# Patient Record
Sex: Female | Born: 1945 | Race: White | Hispanic: No | Marital: Married | Smoking: Former smoker
Health system: Southern US, Community
[De-identification: ages and names within clinical notes are randomized; demographics above are authoritative.]

## PROBLEM LIST (undated history)

## (undated) DIAGNOSIS — L409 Psoriasis, unspecified: Secondary | ICD-10-CM

## (undated) DIAGNOSIS — M549 Dorsalgia, unspecified: Secondary | ICD-10-CM

## (undated) DIAGNOSIS — F32A Depression, unspecified: Secondary | ICD-10-CM

## (undated) DIAGNOSIS — F419 Anxiety disorder, unspecified: Secondary | ICD-10-CM

## (undated) DIAGNOSIS — B9562 Methicillin resistant Staphylococcus aureus infection as the cause of diseases classified elsewhere: Secondary | ICD-10-CM

## (undated) DIAGNOSIS — K439 Ventral hernia without obstruction or gangrene: Secondary | ICD-10-CM

## (undated) DIAGNOSIS — G8929 Other chronic pain: Secondary | ICD-10-CM

## (undated) DIAGNOSIS — N390 Urinary tract infection, site not specified: Secondary | ICD-10-CM

## (undated) DIAGNOSIS — T8859XA Other complications of anesthesia, initial encounter: Secondary | ICD-10-CM

## (undated) DIAGNOSIS — M3500 Sicca syndrome, unspecified: Secondary | ICD-10-CM

## (undated) DIAGNOSIS — R569 Unspecified convulsions: Secondary | ICD-10-CM

## (undated) DIAGNOSIS — F319 Bipolar disorder, unspecified: Secondary | ICD-10-CM

## (undated) DIAGNOSIS — T4145XA Adverse effect of unspecified anesthetic, initial encounter: Secondary | ICD-10-CM

## (undated) DIAGNOSIS — K5790 Diverticulosis of intestine, part unspecified, without perforation or abscess without bleeding: Secondary | ICD-10-CM

## (undated) DIAGNOSIS — F329 Major depressive disorder, single episode, unspecified: Secondary | ICD-10-CM

## (undated) DIAGNOSIS — D6861 Antiphospholipid syndrome: Secondary | ICD-10-CM

## (undated) DIAGNOSIS — L0390 Cellulitis, unspecified: Secondary | ICD-10-CM

## (undated) HISTORY — PX: APPENDECTOMY: SHX54

## (undated) HISTORY — PX: ABDOMINAL HYSTERECTOMY: SHX81

## (undated) HISTORY — PX: CHOLECYSTECTOMY: SHX55

## (undated) HISTORY — PX: PILONIDAL CYST EXCISION: SHX744

---

## 1998-07-23 ENCOUNTER — Encounter: Payer: Self-pay | Admitting: Emergency Medicine

## 1998-07-23 ENCOUNTER — Emergency Department (HOSPITAL_COMMUNITY): Admission: EM | Admit: 1998-07-23 | Discharge: 1998-07-23 | Payer: Self-pay | Admitting: Emergency Medicine

## 1999-06-29 ENCOUNTER — Ambulatory Visit: Admission: RE | Admit: 1999-06-29 | Discharge: 1999-06-29 | Payer: Self-pay | Admitting: Family Medicine

## 1999-07-17 ENCOUNTER — Other Ambulatory Visit: Admission: RE | Admit: 1999-07-17 | Discharge: 1999-07-17 | Payer: Self-pay | Admitting: Obstetrics and Gynecology

## 2000-03-02 ENCOUNTER — Emergency Department (HOSPITAL_COMMUNITY): Admission: EM | Admit: 2000-03-02 | Discharge: 2000-03-02 | Payer: Self-pay | Admitting: *Deleted

## 2000-03-02 ENCOUNTER — Encounter: Payer: Self-pay | Admitting: Emergency Medicine

## 2000-11-06 ENCOUNTER — Emergency Department (HOSPITAL_COMMUNITY): Admission: EM | Admit: 2000-11-06 | Discharge: 2000-11-06 | Payer: Self-pay | Admitting: Emergency Medicine

## 2000-11-26 ENCOUNTER — Encounter: Payer: Self-pay | Admitting: Oral Surgery

## 2000-12-02 ENCOUNTER — Ambulatory Visit (HOSPITAL_COMMUNITY): Admission: RE | Admit: 2000-12-02 | Discharge: 2000-12-03 | Payer: Self-pay | Admitting: Oral Surgery

## 2001-05-22 ENCOUNTER — Emergency Department (HOSPITAL_COMMUNITY): Admission: EM | Admit: 2001-05-22 | Discharge: 2001-05-22 | Payer: Self-pay | Admitting: Emergency Medicine

## 2001-05-22 ENCOUNTER — Encounter: Payer: Self-pay | Admitting: Emergency Medicine

## 2001-05-22 ENCOUNTER — Inpatient Hospital Stay (HOSPITAL_COMMUNITY): Admission: EM | Admit: 2001-05-22 | Discharge: 2001-06-01 | Payer: Self-pay

## 2002-01-24 ENCOUNTER — Emergency Department (HOSPITAL_COMMUNITY): Admission: EM | Admit: 2002-01-24 | Discharge: 2002-01-24 | Payer: Self-pay | Admitting: Emergency Medicine

## 2002-08-29 ENCOUNTER — Emergency Department (HOSPITAL_COMMUNITY): Admission: EM | Admit: 2002-08-29 | Discharge: 2002-08-29 | Payer: Self-pay | Admitting: Emergency Medicine

## 2002-08-29 ENCOUNTER — Encounter: Payer: Self-pay | Admitting: Emergency Medicine

## 2003-10-31 ENCOUNTER — Inpatient Hospital Stay (HOSPITAL_COMMUNITY): Admission: EM | Admit: 2003-10-31 | Discharge: 2003-11-04 | Payer: Self-pay | Admitting: Emergency Medicine

## 2003-11-26 ENCOUNTER — Emergency Department (HOSPITAL_COMMUNITY): Admission: EM | Admit: 2003-11-26 | Discharge: 2003-11-27 | Payer: Self-pay

## 2004-05-10 ENCOUNTER — Encounter
Admission: RE | Admit: 2004-05-10 | Discharge: 2004-08-08 | Payer: Self-pay | Admitting: Physical Medicine & Rehabilitation

## 2004-05-14 ENCOUNTER — Ambulatory Visit: Payer: Self-pay | Admitting: Physical Medicine & Rehabilitation

## 2004-05-15 ENCOUNTER — Inpatient Hospital Stay (HOSPITAL_COMMUNITY): Admission: EM | Admit: 2004-05-15 | Discharge: 2004-05-23 | Payer: Self-pay | Admitting: Emergency Medicine

## 2004-07-24 ENCOUNTER — Inpatient Hospital Stay (HOSPITAL_COMMUNITY): Admission: EM | Admit: 2004-07-24 | Discharge: 2004-07-26 | Payer: Self-pay | Admitting: Emergency Medicine

## 2004-08-23 ENCOUNTER — Ambulatory Visit (HOSPITAL_COMMUNITY): Admission: RE | Admit: 2004-08-23 | Discharge: 2004-08-23 | Payer: Self-pay

## 2004-08-29 ENCOUNTER — Inpatient Hospital Stay (HOSPITAL_COMMUNITY): Admission: EM | Admit: 2004-08-29 | Discharge: 2004-09-08 | Payer: Self-pay | Admitting: Emergency Medicine

## 2004-10-13 ENCOUNTER — Inpatient Hospital Stay (HOSPITAL_COMMUNITY): Admission: EM | Admit: 2004-10-13 | Discharge: 2004-10-17 | Payer: Self-pay | Admitting: Emergency Medicine

## 2005-01-23 ENCOUNTER — Inpatient Hospital Stay (HOSPITAL_COMMUNITY): Admission: EM | Admit: 2005-01-23 | Discharge: 2005-01-28 | Payer: Self-pay | Admitting: *Deleted

## 2005-02-24 ENCOUNTER — Inpatient Hospital Stay (HOSPITAL_COMMUNITY): Admission: EM | Admit: 2005-02-24 | Discharge: 2005-02-28 | Payer: Self-pay | Admitting: Emergency Medicine

## 2005-02-26 ENCOUNTER — Ambulatory Visit: Payer: Self-pay | Admitting: Internal Medicine

## 2005-02-27 ENCOUNTER — Encounter
Admission: RE | Admit: 2005-02-27 | Discharge: 2005-05-28 | Payer: Self-pay | Admitting: Physical Medicine & Rehabilitation

## 2005-02-27 ENCOUNTER — Ambulatory Visit: Payer: Self-pay | Admitting: Physical Medicine & Rehabilitation

## 2005-05-07 ENCOUNTER — Inpatient Hospital Stay (HOSPITAL_COMMUNITY): Admission: EM | Admit: 2005-05-07 | Discharge: 2005-05-11 | Payer: Self-pay | Admitting: Emergency Medicine

## 2005-06-22 ENCOUNTER — Inpatient Hospital Stay (HOSPITAL_COMMUNITY): Admission: EM | Admit: 2005-06-22 | Discharge: 2005-06-27 | Payer: Self-pay | Admitting: Internal Medicine

## 2005-07-06 ENCOUNTER — Inpatient Hospital Stay (HOSPITAL_COMMUNITY): Admission: EM | Admit: 2005-07-06 | Discharge: 2005-07-10 | Payer: Self-pay | Admitting: Emergency Medicine

## 2005-07-08 ENCOUNTER — Ambulatory Visit: Payer: Self-pay | Admitting: Internal Medicine

## 2005-09-04 ENCOUNTER — Inpatient Hospital Stay (HOSPITAL_COMMUNITY): Admission: EM | Admit: 2005-09-04 | Discharge: 2005-09-08 | Payer: Self-pay | Admitting: Emergency Medicine

## 2005-09-06 ENCOUNTER — Encounter (INDEPENDENT_AMBULATORY_CARE_PROVIDER_SITE_OTHER): Payer: Self-pay | Admitting: *Deleted

## 2005-11-06 ENCOUNTER — Emergency Department (HOSPITAL_COMMUNITY): Admission: EM | Admit: 2005-11-06 | Discharge: 2005-11-06 | Payer: Self-pay | Admitting: Emergency Medicine

## 2006-02-28 ENCOUNTER — Inpatient Hospital Stay (HOSPITAL_COMMUNITY): Admission: EM | Admit: 2006-02-28 | Discharge: 2006-03-21 | Payer: Self-pay | Admitting: Emergency Medicine

## 2006-03-06 ENCOUNTER — Ambulatory Visit: Payer: Self-pay | Admitting: Infectious Diseases

## 2006-03-14 ENCOUNTER — Ambulatory Visit: Payer: Self-pay | Admitting: Physical Medicine & Rehabilitation

## 2006-03-14 ENCOUNTER — Encounter (INDEPENDENT_AMBULATORY_CARE_PROVIDER_SITE_OTHER): Payer: Self-pay | Admitting: Neurology

## 2006-04-27 ENCOUNTER — Emergency Department (HOSPITAL_COMMUNITY): Admission: EM | Admit: 2006-04-27 | Discharge: 2006-04-28 | Payer: Self-pay | Admitting: Emergency Medicine

## 2006-10-24 ENCOUNTER — Ambulatory Visit (HOSPITAL_COMMUNITY): Admission: RE | Admit: 2006-10-24 | Discharge: 2006-10-24 | Payer: Self-pay | Admitting: Urology

## 2007-01-18 ENCOUNTER — Ambulatory Visit (HOSPITAL_COMMUNITY): Admission: RE | Admit: 2007-01-18 | Discharge: 2007-01-18 | Payer: Self-pay | Admitting: Emergency Medicine

## 2008-03-21 ENCOUNTER — Inpatient Hospital Stay (HOSPITAL_COMMUNITY): Admission: EM | Admit: 2008-03-21 | Discharge: 2008-03-31 | Payer: Self-pay | Admitting: Emergency Medicine

## 2008-03-21 ENCOUNTER — Ambulatory Visit: Payer: Self-pay | Admitting: Infectious Diseases

## 2008-08-12 ENCOUNTER — Inpatient Hospital Stay (HOSPITAL_COMMUNITY): Admission: EM | Admit: 2008-08-12 | Discharge: 2008-08-24 | Payer: Self-pay | Admitting: Emergency Medicine

## 2008-09-15 ENCOUNTER — Inpatient Hospital Stay (HOSPITAL_COMMUNITY): Admission: EM | Admit: 2008-09-15 | Discharge: 2008-09-24 | Payer: Self-pay | Admitting: Emergency Medicine

## 2009-06-22 ENCOUNTER — Inpatient Hospital Stay (HOSPITAL_COMMUNITY): Admission: EM | Admit: 2009-06-22 | Discharge: 2009-06-28 | Payer: Self-pay | Admitting: Emergency Medicine

## 2009-12-01 ENCOUNTER — Inpatient Hospital Stay (HOSPITAL_COMMUNITY): Admission: EM | Admit: 2009-12-01 | Discharge: 2009-12-05 | Payer: Self-pay | Admitting: Emergency Medicine

## 2010-01-04 ENCOUNTER — Ambulatory Visit (HOSPITAL_COMMUNITY): Admission: RE | Admit: 2010-01-04 | Discharge: 2010-01-04 | Payer: Self-pay | Admitting: Internal Medicine

## 2010-02-07 ENCOUNTER — Encounter (HOSPITAL_BASED_OUTPATIENT_CLINIC_OR_DEPARTMENT_OTHER)
Admission: RE | Admit: 2010-02-07 | Discharge: 2010-02-12 | Payer: Self-pay | Source: Home / Self Care | Attending: General Surgery | Admitting: General Surgery

## 2010-03-25 ENCOUNTER — Encounter: Payer: Self-pay | Admitting: Anesthesiology

## 2010-05-16 LAB — GLUCOSE, CAPILLARY
Glucose-Capillary: 114 mg/dL — ABNORMAL HIGH (ref 70–99)
Glucose-Capillary: 118 mg/dL — ABNORMAL HIGH (ref 70–99)
Glucose-Capillary: 163 mg/dL — ABNORMAL HIGH (ref 70–99)
Glucose-Capillary: 173 mg/dL — ABNORMAL HIGH (ref 70–99)
Glucose-Capillary: 190 mg/dL — ABNORMAL HIGH (ref 70–99)
Glucose-Capillary: 191 mg/dL — ABNORMAL HIGH (ref 70–99)
Glucose-Capillary: 215 mg/dL — ABNORMAL HIGH (ref 70–99)
Glucose-Capillary: 90 mg/dL (ref 70–99)

## 2010-05-16 LAB — CBC
HCT: 38 % (ref 36.0–46.0)
HCT: 38.3 % (ref 36.0–46.0)
Hemoglobin: 12.1 g/dL (ref 12.0–15.0)
MCH: 27.7 pg (ref 26.0–34.0)
MCH: 27.8 pg (ref 26.0–34.0)
MCHC: 33.3 g/dL (ref 30.0–36.0)
MCV: 83.5 fL (ref 78.0–100.0)
Platelets: 205 10*3/uL (ref 150–400)
RBC: 4.38 MIL/uL (ref 3.87–5.11)
RBC: 4.54 MIL/uL (ref 3.87–5.11)
RBC: 4.59 MIL/uL (ref 3.87–5.11)
RDW: 14.1 % (ref 11.5–15.5)

## 2010-05-16 LAB — BASIC METABOLIC PANEL
BUN: 9 mg/dL (ref 6–23)
CO2: 30 mEq/L (ref 19–32)
CO2: 31 mEq/L (ref 19–32)
Calcium: 9.5 mg/dL (ref 8.4–10.5)
Calcium: 9.6 mg/dL (ref 8.4–10.5)
Chloride: 105 mEq/L (ref 96–112)
Creatinine, Ser: 0.79 mg/dL (ref 0.4–1.2)
GFR calc Af Amer: 55 mL/min — ABNORMAL LOW (ref >60)
GFR calc Af Amer: >60 mL/min (ref >60)
GFR calc non Af Amer: >60 mL/min (ref >60)
GFR calc non Af Amer: >60 mL/min (ref >60)
Glucose, Bld: 107 mg/dL — ABNORMAL HIGH (ref 70–99)
Glucose, Bld: 147 mg/dL — ABNORMAL HIGH (ref 70–99)
Potassium: 3.8 mEq/L (ref 3.5–5.1)
Potassium: 4.1 mEq/L (ref 3.5–5.1)
Sodium: 140 mEq/L (ref 135–145)
Sodium: 141 mEq/L (ref 135–145)
Sodium: 142 mEq/L (ref 135–145)

## 2010-05-16 LAB — VANCOMYCIN, TROUGH: Vancomycin Tr: 40.3 ug/mL (ref 10.0–20.0)

## 2010-05-16 LAB — DIFFERENTIAL
Basophils Relative: 0 % (ref 0–1)
Lymphocytes Relative: 23 % (ref 12–46)
Lymphs Abs: 2 10*3/uL (ref 0.7–4.0)
Monocytes Absolute: 0.2 10*3/uL (ref 0.1–1.0)
Monocytes Relative: 3 % (ref 3–12)
Neutrophils Relative %: 71 % (ref 43–77)

## 2010-05-16 LAB — MRSA PCR SCREENING: MRSA by PCR: POSITIVE — AB

## 2010-05-17 LAB — CBC
Hemoglobin: 13.9 g/dL (ref 12.0–15.0)
MCHC: 34.9 g/dL (ref 30.0–36.0)
Platelets: 218 10*3/uL (ref 150–400)
RDW: 13.9 % (ref 11.5–15.5)

## 2010-05-17 LAB — URINE CULTURE

## 2010-05-17 LAB — URINALYSIS, ROUTINE W REFLEX MICROSCOPIC
Bilirubin Urine: NEGATIVE
Glucose, UA: NEGATIVE mg/dL
Hgb urine dipstick: NEGATIVE
Ketones, ur: NEGATIVE mg/dL
Protein, ur: NEGATIVE mg/dL
pH: 6 (ref 5.0–8.0)

## 2010-05-17 LAB — BASIC METABOLIC PANEL
BUN: 8 mg/dL (ref 6–23)
Calcium: 9.8 mg/dL (ref 8.4–10.5)
GFR calc non Af Amer: >60 mL/min (ref >60)
Glucose, Bld: 144 mg/dL — ABNORMAL HIGH (ref 70–99)
Sodium: 139 mEq/L (ref 135–145)

## 2010-05-17 LAB — DIFFERENTIAL
Basophils Absolute: 0 10*3/uL (ref 0.0–0.1)
Basophils Relative: 0 % (ref 0–1)
Neutro Abs: 9.1 10*3/uL — ABNORMAL HIGH (ref 1.7–7.7)
Neutrophils Relative %: 77 % (ref 43–77)

## 2010-05-17 LAB — GLUCOSE, CAPILLARY

## 2010-05-17 LAB — URINE MICROSCOPIC-ADD ON

## 2010-05-17 LAB — HEMOGLOBIN A1C: Hgb A1c MFr Bld: 6.5 % — ABNORMAL HIGH (ref <5.7)

## 2010-05-22 LAB — URINE MICROSCOPIC-ADD ON

## 2010-05-22 LAB — URINALYSIS, ROUTINE W REFLEX MICROSCOPIC
Ketones, ur: NEGATIVE mg/dL
Nitrite: POSITIVE — AB
Protein, ur: NEGATIVE mg/dL
pH: 5.5 (ref 5.0–8.0)

## 2010-05-22 LAB — COMPREHENSIVE METABOLIC PANEL
ALT: 12 U/L (ref 0–35)
ALT: 14 U/L (ref 0–35)
AST: 13 U/L (ref 0–37)
AST: 14 U/L (ref 0–37)
Albumin: 3.1 g/dL — ABNORMAL LOW (ref 3.5–5.2)
Albumin: 3.2 g/dL — ABNORMAL LOW (ref 3.5–5.2)
Alkaline Phosphatase: 115 U/L (ref 39–117)
CO2: 29 mEq/L (ref 19–32)
CO2: 35 mEq/L — ABNORMAL HIGH (ref 19–32)
Chloride: 102 mEq/L (ref 96–112)
Chloride: 103 mEq/L (ref 96–112)
Creatinine, Ser: 0.68 mg/dL (ref 0.4–1.2)
GFR calc Af Amer: >60 mL/min (ref >60)
GFR calc Af Amer: >60 mL/min (ref >60)
GFR calc non Af Amer: >60 mL/min (ref >60)
GFR calc non Af Amer: >60 mL/min (ref >60)
Potassium: 3.5 mEq/L (ref 3.5–5.1)
Potassium: 4.1 mEq/L (ref 3.5–5.1)
Sodium: 137 mEq/L (ref 135–145)
Total Bilirubin: 0.3 mg/dL (ref 0.3–1.2)
Total Bilirubin: 0.6 mg/dL (ref 0.3–1.2)

## 2010-05-22 LAB — GLUCOSE, CAPILLARY
Glucose-Capillary: 100 mg/dL — ABNORMAL HIGH (ref 70–99)
Glucose-Capillary: 121 mg/dL — ABNORMAL HIGH (ref 70–99)
Glucose-Capillary: 132 mg/dL — ABNORMAL HIGH (ref 70–99)
Glucose-Capillary: 139 mg/dL — ABNORMAL HIGH (ref 70–99)
Glucose-Capillary: 139 mg/dL — ABNORMAL HIGH (ref 70–99)
Glucose-Capillary: 142 mg/dL — ABNORMAL HIGH (ref 70–99)
Glucose-Capillary: 160 mg/dL — ABNORMAL HIGH (ref 70–99)
Glucose-Capillary: 180 mg/dL — ABNORMAL HIGH (ref 70–99)
Glucose-Capillary: 196 mg/dL — ABNORMAL HIGH (ref 70–99)
Glucose-Capillary: 213 mg/dL — ABNORMAL HIGH (ref 70–99)
Glucose-Capillary: 74 mg/dL (ref 70–99)

## 2010-05-22 LAB — CBC
HCT: 40.1 % (ref 36.0–46.0)
Hemoglobin: 13.1 g/dL (ref 12.0–15.0)
MCHC: 33 g/dL (ref 30.0–36.0)
MCV: 82.5 fL (ref 78.0–100.0)
MCV: 82.8 fL (ref 78.0–100.0)
Platelets: 202 10*3/uL (ref 150–400)
Platelets: 220 10*3/uL (ref 150–400)
RBC: 4.81 MIL/uL (ref 3.87–5.11)
RBC: 4.84 MIL/uL (ref 3.87–5.11)
RBC: 4.93 MIL/uL (ref 3.87–5.11)
WBC: 12.4 10*3/uL — ABNORMAL HIGH (ref 4.0–10.5)
WBC: 9.7 10*3/uL (ref 4.0–10.5)

## 2010-05-22 LAB — DIFFERENTIAL
Basophils Absolute: 0.1 10*3/uL (ref 0.0–0.1)
Basophils Absolute: 0.1 10*3/uL (ref 0.0–0.1)
Basophils Relative: 1 % (ref 0–1)
Eosinophils Absolute: 0.2 10*3/uL (ref 0.0–0.7)
Eosinophils Absolute: 0.3 10*3/uL (ref 0.0–0.7)
Eosinophils Relative: 2 % (ref 0–5)
Eosinophils Relative: 3 % (ref 0–5)
Monocytes Absolute: 0.4 10*3/uL (ref 0.1–1.0)
Monocytes Absolute: 0.5 10*3/uL (ref 0.1–1.0)
Monocytes Relative: 5 % (ref 3–12)

## 2010-05-22 LAB — BASIC METABOLIC PANEL
CO2: 29 mEq/L (ref 19–32)
Chloride: 100 mEq/L (ref 96–112)
Creatinine, Ser: 0.74 mg/dL (ref 0.4–1.2)
GFR calc Af Amer: >60 mL/min (ref >60)
Glucose, Bld: 156 mg/dL — ABNORMAL HIGH (ref 70–99)
Sodium: 138 mEq/L (ref 135–145)

## 2010-05-22 LAB — POCT CARDIAC MARKERS: Troponin i, poc: <0.05 ng/mL (ref 0.00–0.09)

## 2010-05-22 LAB — URINE CULTURE
Colony Count: NO GROWTH
Culture: NO GROWTH
Special Requests: NEGATIVE

## 2010-06-10 LAB — BASIC METABOLIC PANEL
BUN: 3 mg/dL — ABNORMAL LOW (ref 6–23)
BUN: 14 mg/dL (ref 6–23)
Chloride: 106 mEq/L (ref 96–112)
Creatinine, Ser: 0.55 mg/dL (ref 0.4–1.2)
Creatinine, Ser: 0.62 mg/dL (ref 0.4–1.2)
GFR calc non Af Amer: >60 mL/min (ref >60)
Glucose, Bld: 125 mg/dL — ABNORMAL HIGH (ref 70–99)
Glucose, Bld: 103 mg/dL — ABNORMAL HIGH (ref 70–99)
Potassium: 3.7 mEq/L (ref 3.5–5.1)

## 2010-06-10 LAB — GLUCOSE, CAPILLARY
Glucose-Capillary: 115 mg/dL — ABNORMAL HIGH (ref 70–99)
Glucose-Capillary: 119 mg/dL — ABNORMAL HIGH (ref 70–99)
Glucose-Capillary: 124 mg/dL — ABNORMAL HIGH (ref 70–99)
Glucose-Capillary: 129 mg/dL — ABNORMAL HIGH (ref 70–99)
Glucose-Capillary: 130 mg/dL — ABNORMAL HIGH (ref 70–99)
Glucose-Capillary: 139 mg/dL — ABNORMAL HIGH (ref 70–99)
Glucose-Capillary: 152 mg/dL — ABNORMAL HIGH (ref 70–99)
Glucose-Capillary: 157 mg/dL — ABNORMAL HIGH (ref 70–99)
Glucose-Capillary: 99 mg/dL (ref 70–99)
Glucose-Capillary: 135 mg/dL — ABNORMAL HIGH (ref 70–99)
Glucose-Capillary: 140 mg/dL — ABNORMAL HIGH (ref 70–99)
Glucose-Capillary: 99 mg/dL (ref 70–99)

## 2010-06-10 LAB — URINE CULTURE
Colony Count: NO GROWTH
Colony Count: 65000
Culture: NO GROWTH

## 2010-06-10 LAB — CBC
HCT: 35.8 % — ABNORMAL LOW (ref 36.0–46.0)
HCT: 37.4 % (ref 36.0–46.0)
HCT: 37.6 % (ref 36.0–46.0)
Hemoglobin: 11.8 g/dL — ABNORMAL LOW (ref 12.0–15.0)
MCHC: 32.7 g/dL (ref 30.0–36.0)
MCHC: 33.1 g/dL (ref 30.0–36.0)
MCV: 84.3 fL (ref 78.0–100.0)
MCV: 83.9 fL (ref 78.0–100.0)
Platelets: 166 10*3/uL (ref 150–400)
Platelets: 184 10*3/uL (ref 150–400)
Platelets: 199 10*3/uL (ref 150–400)
RDW: 16.1 % — ABNORMAL HIGH (ref 11.5–15.5)
RDW: 16.4 % — ABNORMAL HIGH (ref 11.5–15.5)
RDW: 16.5 % — ABNORMAL HIGH (ref 11.5–15.5)
RDW: 16.8 % — ABNORMAL HIGH (ref 11.5–15.5)
WBC: 7.5 10*3/uL (ref 4.0–10.5)
WBC: 11.1 10*3/uL — ABNORMAL HIGH (ref 4.0–10.5)

## 2010-06-10 LAB — COMPREHENSIVE METABOLIC PANEL
Albumin: 2.8 g/dL — ABNORMAL LOW (ref 3.5–5.2)
Alkaline Phosphatase: 116 U/L (ref 39–117)
BUN: 2 mg/dL — ABNORMAL LOW (ref 6–23)
BUN: 6 mg/dL (ref 6–23)
CO2: 29 mEq/L (ref 19–32)
Chloride: 108 mEq/L (ref 96–112)
Chloride: 109 mEq/L (ref 96–112)
Creatinine, Ser: 0.61 mg/dL (ref 0.4–1.2)
GFR calc non Af Amer: >60 mL/min (ref >60)
Glucose, Bld: 131 mg/dL — ABNORMAL HIGH (ref 70–99)
Potassium: 4.1 mEq/L (ref 3.5–5.1)
Total Bilirubin: 0.4 mg/dL (ref 0.3–1.2)
Total Bilirubin: 0.4 mg/dL (ref 0.3–1.2)
Total Protein: 5.6 g/dL — ABNORMAL LOW (ref 6.0–8.3)

## 2010-06-10 LAB — URINALYSIS, ROUTINE W REFLEX MICROSCOPIC
Bilirubin Urine: NEGATIVE
Ketones, ur: NEGATIVE mg/dL
Nitrite: NEGATIVE
Protein, ur: NEGATIVE mg/dL

## 2010-06-10 LAB — VANCOMYCIN, TROUGH
Vancomycin Tr: 23.7 ug/mL — ABNORMAL HIGH (ref 10.0–20.0)
Vancomycin Tr: 7 ug/mL — ABNORMAL LOW (ref 10.0–20.0)

## 2010-06-10 LAB — DIFFERENTIAL
Basophils Absolute: 0 10*3/uL (ref 0.0–0.1)
Basophils Relative: 0 % (ref 0–1)
Eosinophils Absolute: 0.2 10*3/uL (ref 0.0–0.7)
Lymphs Abs: 2 10*3/uL (ref 0.7–4.0)
Neutrophils Relative %: 76 % (ref 43–77)

## 2010-06-10 LAB — SEDIMENTATION RATE: Sed Rate: 35 mm/hr — ABNORMAL HIGH (ref 0–22)

## 2010-06-10 LAB — HEMOGLOBIN A1C: Hgb A1c MFr Bld: 6.2 % — ABNORMAL HIGH (ref 4.6–6.1)

## 2010-06-10 LAB — PHOSPHORUS: Phosphorus: 3.5 mg/dL (ref 2.3–4.6)

## 2010-06-10 LAB — CULTURE, BLOOD (ROUTINE X 2)

## 2010-06-10 LAB — CREATININE, SERUM
GFR calc Af Amer: >60 mL/min (ref >60)
GFR calc non Af Amer: >60 mL/min (ref >60)

## 2010-06-11 LAB — CBC
HCT: 38.2 % (ref 36.0–46.0)
HCT: 35.7 % — ABNORMAL LOW (ref 36.0–46.0)
HCT: 35.8 % — ABNORMAL LOW (ref 36.0–46.0)
HCT: 36.8 % (ref 36.0–46.0)
HCT: 38.4 % (ref 36.0–46.0)
Hemoglobin: 12.6 g/dL (ref 12.0–15.0)
Hemoglobin: 11.8 g/dL — ABNORMAL LOW (ref 12.0–15.0)
Hemoglobin: 11.9 g/dL — ABNORMAL LOW (ref 12.0–15.0)
Hemoglobin: 12.7 g/dL (ref 12.0–15.0)
MCHC: 32.7 g/dL (ref 30.0–36.0)
MCHC: 33.5 g/dL (ref 30.0–36.0)
MCHC: 32.8 g/dL (ref 30.0–36.0)
MCHC: 33 g/dL (ref 30.0–36.0)
MCHC: 33.8 g/dL (ref 30.0–36.0)
MCV: 83 fL (ref 78.0–100.0)
MCV: 83.1 fL (ref 78.0–100.0)
MCV: 83.5 fL (ref 78.0–100.0)
MCV: 82.3 fL (ref 78.0–100.0)
MCV: 83.7 fL (ref 78.0–100.0)
Platelets: 214 10*3/uL (ref 150–400)
Platelets: 236 10*3/uL (ref 150–400)
Platelets: 181 10*3/uL (ref 150–400)
Platelets: 189 10*3/uL (ref 150–400)
RBC: 4.26 MIL/uL (ref 3.87–5.11)
RBC: 4.58 MIL/uL (ref 3.87–5.11)
RBC: 4.67 MIL/uL (ref 3.87–5.11)
RDW: 15 % (ref 11.5–15.5)
RDW: 13.8 % (ref 11.5–15.5)
RDW: 13.8 % (ref 11.5–15.5)
WBC: 10.3 10*3/uL (ref 4.0–10.5)
WBC: 9.4 10*3/uL (ref 4.0–10.5)
WBC: 8.1 10*3/uL (ref 4.0–10.5)
WBC: 8.7 10*3/uL (ref 4.0–10.5)

## 2010-06-11 LAB — BASIC METABOLIC PANEL
BUN: 12 mg/dL (ref 6–23)
BUN: 12 mg/dL (ref 6–23)
BUN: 5 mg/dL — ABNORMAL LOW (ref 6–23)
BUN: 5 mg/dL — ABNORMAL LOW (ref 6–23)
BUN: 3 mg/dL — ABNORMAL LOW (ref 6–23)
BUN: 6 mg/dL (ref 6–23)
CO2: 26 mEq/L (ref 19–32)
CO2: 29 mEq/L (ref 19–32)
CO2: 32 mEq/L (ref 19–32)
Calcium: 9.3 mg/dL (ref 8.4–10.5)
Chloride: 104 mEq/L (ref 96–112)
Chloride: 106 mEq/L (ref 96–112)
Chloride: 106 mEq/L (ref 96–112)
Chloride: 100 mEq/L (ref 96–112)
Creatinine, Ser: 0.73 mg/dL (ref 0.4–1.2)
Creatinine, Ser: 0.82 mg/dL (ref 0.4–1.2)
GFR calc Af Amer: >60 mL/min (ref >60)
GFR calc non Af Amer: >60 mL/min (ref >60)
GFR calc non Af Amer: >60 mL/min (ref >60)
Glucose, Bld: 149 mg/dL — ABNORMAL HIGH (ref 70–99)
Glucose, Bld: 154 mg/dL — ABNORMAL HIGH (ref 70–99)
Glucose, Bld: 142 mg/dL — ABNORMAL HIGH (ref 70–99)
Glucose, Bld: 192 mg/dL — ABNORMAL HIGH (ref 70–99)
Potassium: 3.8 mEq/L (ref 3.5–5.1)
Potassium: 4.1 mEq/L (ref 3.5–5.1)
Potassium: 3.5 mEq/L (ref 3.5–5.1)
Potassium: 4.1 mEq/L (ref 3.5–5.1)
Sodium: 138 mEq/L (ref 135–145)
Sodium: 144 mEq/L (ref 135–145)

## 2010-06-11 LAB — URINALYSIS, ROUTINE W REFLEX MICROSCOPIC
Bilirubin Urine: NEGATIVE
Hgb urine dipstick: NEGATIVE
Ketones, ur: NEGATIVE mg/dL
Specific Gravity, Urine: 1.011 (ref 1.005–1.030)
pH: 8 (ref 5.0–8.0)

## 2010-06-11 LAB — COMPREHENSIVE METABOLIC PANEL
Albumin: 2.3 g/dL — ABNORMAL LOW (ref 3.5–5.2)
Alkaline Phosphatase: 96 U/L (ref 39–117)
BUN: 7 mg/dL (ref 6–23)
Chloride: 101 mEq/L (ref 96–112)
Potassium: 4.3 mEq/L (ref 3.5–5.1)
Total Bilirubin: 0.3 mg/dL (ref 0.3–1.2)

## 2010-06-11 LAB — GLUCOSE, CAPILLARY
Glucose-Capillary: 109 mg/dL — ABNORMAL HIGH (ref 70–99)
Glucose-Capillary: 122 mg/dL — ABNORMAL HIGH (ref 70–99)
Glucose-Capillary: 125 mg/dL — ABNORMAL HIGH (ref 70–99)
Glucose-Capillary: 137 mg/dL — ABNORMAL HIGH (ref 70–99)
Glucose-Capillary: 139 mg/dL — ABNORMAL HIGH (ref 70–99)
Glucose-Capillary: 140 mg/dL — ABNORMAL HIGH (ref 70–99)
Glucose-Capillary: 151 mg/dL — ABNORMAL HIGH (ref 70–99)
Glucose-Capillary: 155 mg/dL — ABNORMAL HIGH (ref 70–99)
Glucose-Capillary: 157 mg/dL — ABNORMAL HIGH (ref 70–99)
Glucose-Capillary: 157 mg/dL — ABNORMAL HIGH (ref 70–99)
Glucose-Capillary: 162 mg/dL — ABNORMAL HIGH (ref 70–99)
Glucose-Capillary: 61 mg/dL — ABNORMAL LOW (ref 70–99)
Glucose-Capillary: 67 mg/dL — ABNORMAL LOW (ref 70–99)
Glucose-Capillary: 93 mg/dL (ref 70–99)
Glucose-Capillary: 96 mg/dL (ref 70–99)
Glucose-Capillary: 121 mg/dL — ABNORMAL HIGH (ref 70–99)
Glucose-Capillary: 123 mg/dL — ABNORMAL HIGH (ref 70–99)
Glucose-Capillary: 125 mg/dL — ABNORMAL HIGH (ref 70–99)
Glucose-Capillary: 127 mg/dL — ABNORMAL HIGH (ref 70–99)
Glucose-Capillary: 130 mg/dL — ABNORMAL HIGH (ref 70–99)
Glucose-Capillary: 68 mg/dL — ABNORMAL LOW (ref 70–99)
Glucose-Capillary: 71 mg/dL (ref 70–99)

## 2010-06-11 LAB — DIFFERENTIAL
Eosinophils Absolute: 0.2 10*3/uL (ref 0.0–0.7)
Lymphs Abs: 1.9 10*3/uL (ref 0.7–4.0)
Monocytes Absolute: 0.5 10*3/uL (ref 0.1–1.0)
Monocytes Relative: 6 % (ref 3–12)
Neutrophils Relative %: 70 % (ref 43–77)

## 2010-06-11 LAB — CLOSTRIDIUM DIFFICILE EIA: C difficile Toxins A+B, EIA: NEGATIVE

## 2010-06-11 LAB — POCT I-STAT, CHEM 8
Calcium, Ion: 1.23 mmol/L (ref 1.12–1.32)
Creatinine, Ser: 0.6 mg/dL (ref 0.4–1.2)
Glucose, Bld: 126 mg/dL — ABNORMAL HIGH (ref 70–99)
Hemoglobin: 13.3 g/dL (ref 12.0–15.0)
Potassium: 4.3 mEq/L (ref 3.5–5.1)

## 2010-06-11 LAB — VANCOMYCIN, RANDOM: Vancomycin Rm: 17.1 ug/mL

## 2010-06-11 LAB — WOUND CULTURE

## 2010-06-11 LAB — URINE MICROSCOPIC-ADD ON

## 2010-06-11 LAB — VANCOMYCIN, TROUGH: Vancomycin Tr: 10.2 ug/mL (ref 10.0–20.0)

## 2010-06-18 LAB — CBC
HCT: 34.8 % — ABNORMAL LOW (ref 36.0–46.0)
HCT: 35 % — ABNORMAL LOW (ref 36.0–46.0)
HCT: 36.3 % (ref 36.0–46.0)
Hemoglobin: 11.5 g/dL — ABNORMAL LOW (ref 12.0–15.0)
Hemoglobin: 11.7 g/dL — ABNORMAL LOW (ref 12.0–15.0)
Hemoglobin: 12.2 g/dL (ref 12.0–15.0)
Hemoglobin: 13.1 g/dL (ref 12.0–15.0)
MCHC: 32.7 g/dL (ref 30.0–36.0)
MCHC: 33 g/dL (ref 30.0–36.0)
MCHC: 33 g/dL (ref 30.0–36.0)
MCHC: 33.4 g/dL (ref 30.0–36.0)
MCHC: 33.5 g/dL (ref 30.0–36.0)
MCV: 86.1 fL (ref 78.0–100.0)
MCV: 87 fL (ref 78.0–100.0)
MCV: 87.2 fL (ref 78.0–100.0)
Platelets: 183 10*3/uL (ref 150–400)
Platelets: 200 10*3/uL (ref 150–400)
Platelets: 200 10*3/uL (ref 150–400)
Platelets: 206 10*3/uL (ref 150–400)
Platelets: 213 10*3/uL (ref 150–400)
RBC: 4 MIL/uL (ref 3.87–5.11)
RBC: 4.17 MIL/uL (ref 3.87–5.11)
RBC: 4.18 MIL/uL (ref 3.87–5.11)
RDW: 13.1 % (ref 11.5–15.5)
RDW: 13.2 % (ref 11.5–15.5)
RDW: 13.4 % (ref 11.5–15.5)
RDW: 13.4 % (ref 11.5–15.5)
RDW: 13.6 % (ref 11.5–15.5)
RDW: 13.6 % (ref 11.5–15.5)
WBC: 6.7 10*3/uL (ref 4.0–10.5)
WBC: 6.9 10*3/uL (ref 4.0–10.5)
WBC: 7.7 10*3/uL (ref 4.0–10.5)

## 2010-06-18 LAB — COMPREHENSIVE METABOLIC PANEL
ALT: 11 U/L (ref 0–35)
ALT: 15 U/L (ref 0–35)
AST: 16 U/L (ref 0–37)
AST: 18 U/L (ref 0–37)
Albumin: 2.4 g/dL — ABNORMAL LOW (ref 3.5–5.2)
Alkaline Phosphatase: 76 U/L (ref 39–117)
Alkaline Phosphatase: 77 U/L (ref 39–117)
Alkaline Phosphatase: 77 U/L (ref 39–117)
Alkaline Phosphatase: 85 U/L (ref 39–117)
Alkaline Phosphatase: 93 U/L (ref 39–117)
BUN: 2 mg/dL — ABNORMAL LOW (ref 6–23)
BUN: 3 mg/dL — ABNORMAL LOW (ref 6–23)
BUN: 4 mg/dL — ABNORMAL LOW (ref 6–23)
CO2: 25 mEq/L (ref 19–32)
CO2: 29 mEq/L (ref 19–32)
Calcium: 8.7 mg/dL (ref 8.4–10.5)
Calcium: 8.7 mg/dL (ref 8.4–10.5)
Chloride: 105 mEq/L (ref 96–112)
Chloride: 107 mEq/L (ref 96–112)
Chloride: 109 mEq/L (ref 96–112)
Creatinine, Ser: 0.58 mg/dL (ref 0.4–1.2)
Creatinine, Ser: 0.65 mg/dL (ref 0.4–1.2)
Creatinine, Ser: 0.73 mg/dL (ref 0.4–1.2)
GFR calc Af Amer: >60 mL/min (ref >60)
GFR calc Af Amer: >60 mL/min (ref >60)
GFR calc non Af Amer: >60 mL/min (ref >60)
GFR calc non Af Amer: >60 mL/min (ref >60)
GFR calc non Af Amer: >60 mL/min (ref >60)
Glucose, Bld: 123 mg/dL — ABNORMAL HIGH (ref 70–99)
Glucose, Bld: 150 mg/dL — ABNORMAL HIGH (ref 70–99)
Glucose, Bld: 98 mg/dL (ref 70–99)
Potassium: 3.1 mEq/L — ABNORMAL LOW (ref 3.5–5.1)
Potassium: 3.3 mEq/L — ABNORMAL LOW (ref 3.5–5.1)
Potassium: 3.6 mEq/L (ref 3.5–5.1)
Potassium: 3.6 mEq/L (ref 3.5–5.1)
Potassium: 4.1 mEq/L (ref 3.5–5.1)
Sodium: 139 mEq/L (ref 135–145)
Sodium: 140 mEq/L (ref 135–145)
Sodium: 140 mEq/L (ref 135–145)
Total Bilirubin: 0.3 mg/dL (ref 0.3–1.2)
Total Bilirubin: 0.4 mg/dL (ref 0.3–1.2)
Total Bilirubin: 0.5 mg/dL (ref 0.3–1.2)
Total Protein: 5 g/dL — ABNORMAL LOW (ref 6.0–8.3)
Total Protein: 5.4 g/dL — ABNORMAL LOW (ref 6.0–8.3)
Total Protein: 5.6 g/dL — ABNORMAL LOW (ref 6.0–8.3)

## 2010-06-18 LAB — DIFFERENTIAL
Basophils Absolute: 0 10*3/uL (ref 0.0–0.1)
Basophils Absolute: 0 10*3/uL (ref 0.0–0.1)
Basophils Absolute: 0 10*3/uL (ref 0.0–0.1)
Basophils Absolute: 0.1 10*3/uL (ref 0.0–0.1)
Basophils Relative: 0 % (ref 0–1)
Basophils Relative: 0 % (ref 0–1)
Basophils Relative: 0 % (ref 0–1)
Basophils Relative: 1 % (ref 0–1)
Basophils Relative: 1 % (ref 0–1)
Basophils Relative: 1 % (ref 0–1)
Eosinophils Absolute: 0.1 10*3/uL (ref 0.0–0.7)
Eosinophils Relative: 4 % (ref 0–5)
Eosinophils Relative: 5 % (ref 0–5)
Lymphocytes Relative: 19 % (ref 12–46)
Lymphocytes Relative: 21 % (ref 12–46)
Lymphocytes Relative: 21 % (ref 12–46)
Lymphs Abs: 1.5 10*3/uL (ref 0.7–4.0)
Lymphs Abs: 1.7 10*3/uL (ref 0.7–4.0)
Lymphs Abs: 1.7 10*3/uL (ref 0.7–4.0)
Monocytes Absolute: 0.3 10*3/uL (ref 0.1–1.0)
Monocytes Absolute: 0.3 10*3/uL (ref 0.1–1.0)
Monocytes Relative: 4 % (ref 3–12)
Monocytes Relative: 6 % (ref 3–12)
Monocytes Relative: 6 % (ref 3–12)
Neutro Abs: 4.6 10*3/uL (ref 1.7–7.7)
Neutro Abs: 4.6 10*3/uL (ref 1.7–7.7)
Neutro Abs: 5.8 10*3/uL (ref 1.7–7.7)
Neutro Abs: 6.1 10*3/uL (ref 1.7–7.7)
Neutro Abs: 9.3 10*3/uL — ABNORMAL HIGH (ref 1.7–7.7)
Neutrophils Relative %: 67 % (ref 43–77)
Neutrophils Relative %: 68 % (ref 43–77)
Neutrophils Relative %: 71 % (ref 43–77)
Neutrophils Relative %: 72 % (ref 43–77)

## 2010-06-18 LAB — WOUND CULTURE: Gram Stain: NONE SEEN

## 2010-06-18 LAB — GLUCOSE, CAPILLARY
Glucose-Capillary: 103 mg/dL — ABNORMAL HIGH (ref 70–99)
Glucose-Capillary: 108 mg/dL — ABNORMAL HIGH (ref 70–99)
Glucose-Capillary: 110 mg/dL — ABNORMAL HIGH (ref 70–99)
Glucose-Capillary: 111 mg/dL — ABNORMAL HIGH (ref 70–99)
Glucose-Capillary: 116 mg/dL — ABNORMAL HIGH (ref 70–99)
Glucose-Capillary: 123 mg/dL — ABNORMAL HIGH (ref 70–99)
Glucose-Capillary: 124 mg/dL — ABNORMAL HIGH (ref 70–99)
Glucose-Capillary: 125 mg/dL — ABNORMAL HIGH (ref 70–99)
Glucose-Capillary: 127 mg/dL — ABNORMAL HIGH (ref 70–99)
Glucose-Capillary: 127 mg/dL — ABNORMAL HIGH (ref 70–99)
Glucose-Capillary: 128 mg/dL — ABNORMAL HIGH (ref 70–99)
Glucose-Capillary: 132 mg/dL — ABNORMAL HIGH (ref 70–99)
Glucose-Capillary: 137 mg/dL — ABNORMAL HIGH (ref 70–99)
Glucose-Capillary: 149 mg/dL — ABNORMAL HIGH (ref 70–99)
Glucose-Capillary: 154 mg/dL — ABNORMAL HIGH (ref 70–99)
Glucose-Capillary: 155 mg/dL — ABNORMAL HIGH (ref 70–99)
Glucose-Capillary: 169 mg/dL — ABNORMAL HIGH (ref 70–99)
Glucose-Capillary: 170 mg/dL — ABNORMAL HIGH (ref 70–99)
Glucose-Capillary: 182 mg/dL — ABNORMAL HIGH (ref 70–99)
Glucose-Capillary: 187 mg/dL — ABNORMAL HIGH (ref 70–99)
Glucose-Capillary: 84 mg/dL (ref 70–99)
Glucose-Capillary: 85 mg/dL (ref 70–99)

## 2010-06-18 LAB — LIPID PANEL
Cholesterol: 105 mg/dL (ref 0–200)
Cholesterol: 106 mg/dL (ref 0–200)
HDL: 28 mg/dL — ABNORMAL LOW (ref >39)
LDL Cholesterol: 48 mg/dL (ref 0–99)
LDL Cholesterol: 50 mg/dL (ref 0–99)
Total CHOL/HDL Ratio: 4.1 RATIO
Triglycerides: 146 mg/dL (ref <150)

## 2010-06-18 LAB — LIPASE, BLOOD: Lipase: 18 U/L (ref 11–59)

## 2010-06-18 LAB — URINALYSIS, ROUTINE W REFLEX MICROSCOPIC
Bilirubin Urine: NEGATIVE
Bilirubin Urine: NEGATIVE
Glucose, UA: NEGATIVE mg/dL
Glucose, UA: NEGATIVE mg/dL
Ketones, ur: NEGATIVE mg/dL
Protein, ur: NEGATIVE mg/dL
Specific Gravity, Urine: 1.009 (ref 1.005–1.030)
pH: 6.5 (ref 5.0–8.0)

## 2010-06-18 LAB — BASIC METABOLIC PANEL
BUN: 4 mg/dL — ABNORMAL LOW (ref 6–23)
CO2: 25 mEq/L (ref 19–32)
CO2: 26 mEq/L (ref 19–32)
Calcium: 8.5 mg/dL (ref 8.4–10.5)
Calcium: 8.7 mg/dL (ref 8.4–10.5)
Creatinine, Ser: 0.64 mg/dL (ref 0.4–1.2)
Creatinine, Ser: 0.66 mg/dL (ref 0.4–1.2)
GFR calc Af Amer: >60 mL/min (ref >60)
Glucose, Bld: 141 mg/dL — ABNORMAL HIGH (ref 70–99)
Glucose, Bld: 180 mg/dL — ABNORMAL HIGH (ref 70–99)
Sodium: 138 mEq/L (ref 135–145)

## 2010-06-18 LAB — URINE MICROSCOPIC-ADD ON

## 2010-06-18 LAB — CLOSTRIDIUM DIFFICILE EIA: C difficile Toxins A+B, EIA: NEGATIVE

## 2010-06-18 LAB — HEMOGLOBIN A1C
Hgb A1c MFr Bld: 6.9 % — ABNORMAL HIGH (ref 4.6–6.1)
Mean Plasma Glucose: 151 mg/dL

## 2010-06-18 LAB — MAGNESIUM
Magnesium: 2 mg/dL (ref 1.5–2.5)
Magnesium: 2 mg/dL (ref 1.5–2.5)

## 2010-06-18 LAB — URINE CULTURE
Colony Count: >=100000
Special Requests: POSITIVE

## 2010-06-18 LAB — CULTURE, BLOOD (ROUTINE X 2): Culture: NO GROWTH

## 2010-06-18 LAB — STOOL CULTURE

## 2010-06-18 LAB — HEPATIC FUNCTION PANEL
AST: 30 U/L (ref 0–37)
Bilirubin, Direct: 0.1 mg/dL (ref 0.0–0.3)
Indirect Bilirubin: 0.6 mg/dL (ref 0.3–0.9)

## 2010-06-18 LAB — FECAL LACTOFERRIN, QUANT

## 2010-06-18 LAB — TSH
TSH: 0.907 u[IU]/mL (ref 0.350–4.500)
TSH: 1.289 u[IU]/mL (ref 0.350–4.500)

## 2010-06-18 LAB — T4, FREE: Free T4: 0.94 ng/dL (ref 0.89–1.80)

## 2010-07-17 NOTE — Group Therapy Note (Signed)
NAMESRUTHI, [name] NO.:  [id number]   MEDICAL RECORD NO.:  [id number]          PATIENT TYPE:  INP   LOCATION:  1303                         FACILITY:  [facility]   PHYSICIAN:  Peggye Pitt, M.D. DATE OF BIRTH:  [birth year]                                 PROGRESS NOTE   DATE OF DISCHARGE:  To be determined.   PRIMARY CARE PHYSICIAN:  Florentina Jenny, M.D.   DIAGNOSES:  1. Extensive psoriasis mostly involving the buttocks and thighs.  2. Secondary thigh and buttock cellulitis. Cultures positive with      MRSA, sensitive to PO antibiotics.  3. Diarrhea.  4. Diet controlled diabetes mellitus.  5. Morbid obesity.  6  Anti-phospholipid antibody syndrome.  1. Sjogren's syndrome.  2. Questionable history of myasthenia gravis.   MEDICATIONS:  1. Aspirin 81 mg daily.  2. Questran 4 grams p.o. every 12 hours.  3. Clobetasol cream 0.05%.  4. Cyclobendazole ointment 0.05%, to apply twice daily to affected      areas.  5. Lovenox 75 mg subcutaneously daily.  6. Vitamin D 1 capsule on Tuesdays.  7. Glyburide 5 mg twice daily.  8. Imodium 8 mg daily and 2 mg after each loose bowel movement.  9. Neosporin to apply twice daily to affected areas.  10.Phentermine 37.5 mg daily.  11.Protonix 40 mg every 12 hours.  12.Triamcinolone cream to apply topically three times a day.  13.A variety of p.r.n. medications.   HOSPITAL COURSE:  [name] is a very pleasant 65 year old morbidly obese Caucasian lady  with a history of multiple immune disorders including psoriasis who  presents with her second admission this year for secondary cellulitis of  her thigh and buttock areas.  She was initially placed on vancomycin and  Zosyn. Subsequently cultures started grew staph aureus and her Zosyn was  discontinued.  Her cultures then grew MRSA but sensitive to p.o.  medications. As of today, I will be discontinuing her vancomycin and  transitioning her over to p.o. Bactrim.  She will  probably need to  complete a total of 14 days. Because of her extensive psoriasis  consultation with Dr. Donzetta Starch with dermatology was obtained and he  recommended Triamcinolone cream to apply generously to all affected  areas three times a day. However, he stated that further treatment of  her psoriasis will not be possible until her infection is completely  cleared, given they are immunosuppressants. If possible the patient will  need to follow up with Dr. Yetta Barre in the outpatient setting.   For her diarrhea, which is somewhat chronic, first a C diff has been  negative.  I have started her on Imodium and Questran and this is the  main reason that I am keeping her here in the hospital today because  during her last admission she was discharge and had profuse diarrhea and  this further infected her synovitis.  I have clarified with nursing  dosing schedule for the Imodium.  She has receive 8 mg every morning  whether or not she has a bowel movement and then 2 mg after each loose  bowel  movement.  She is also to get the Questran twice daily.   The rest of her chronic medical issues have been inactive during this  hospitalization.   PHYSICAL EXAMINATION:  VITAL SIGNS:  On day of this dictation, blood pressure 128/72, heart  rate 92, respirations 20, O2 sat's 91% on room air with a temperature of  97.7.   LABORATORY DATA:  On day of this dictation sodium 137, potassium 4.1, chloride 103, bicarb  29, BUN 3, creatinine 0.80 with a glucose of 192, WBC 7.1, hemoglobin  11.8 and a platelet count of 199,000.      Peggye Pitt, M.D.  Electronically Signed     EH/MEDQ  D:  08/16/2008  T:  08/16/2008  Job:  [id number]

## 2010-07-17 NOTE — Discharge Summary (Signed)
NAMENDEYE, TENORIO NO.:  [id number]   MEDICAL RECORD NO.:  [id number]          PATIENT TYPE:  INP   LOCATION:  1530                         FACILITY:  [facility]   PHYSICIAN:  Hillery Aldo, M.D.   DATE OF BIRTH:  11-18-1945   DATE OF ADMISSION:  03/20/2008  DATE OF DISCHARGE:  03/31/2008                               DISCHARGE SUMMARY   PRIMARY CARE PHYSICIAN:  Primary care physician in the past has been  Olene Craven, M.D.  Currently the patient denies that she sees any  one physician.  She will be referred back to Dr. Loney Laurence prior  practice for hospital followup.   DERMATOLOGIST:  Frederick A. Terri Piedra, M.D.   DISCHARGE DIAGNOSES:  1. Cellulitis superimposed on severe psoriasis.  2. Yeast pyelonephritis.  3. Thrush.  4. Type 2 diabetes mellitus.  5. Hypokalemia.  6. Morbid obesity.  7. Antiphospholipid antibody syndrome.  8. Sjogren's syndrome.  9. Questionable history of myasthenia gravis.  10.Deconditioning.   DISCHARGE MEDICATIONS:  1. Clobetasol 0.05% to psoriatic plaques b.i.d. and p.r.n.  2. Ketoconazole cream 120 g apply to skin folds b.i.d.  3. Aspirin 81 mg daily.  4. Diflucan 200 mg daily times 1 more week.  5. Prozac 20 mg daily.  6. Glyburide 5 mg b.i.d.  7. Avelox 400 mg daily through April 11, 2008.  8. Percocet 10/325 one tablet q.6 h p.r.n. pain.  9. Protonix 40 mg daily.  10.Valium 10 mg p.o. q.h.s. p.r.n.  11.Benadryl 25 mg p.o. q.4 h p.r.n.  12.Phentermine 37.5 mg daily.   CONSULTATIONS:  1. Mick Sell, MD, of infectious diseases.  2. Ardelle Balls, MD, of dermatology.   BRIEF ADMISSION HISTORY OF PRESENT ILLNESS:  The patient is a morbidly  obese female with past medical history of severe psoriasis who presented  to the hospital with a chief complaint of dysuria, polyuria, flank pain,  nausea, vomiting and worsening lower extremity erythema and pain  consistent with cellulitis.  She was admitted for  further evaluation and  infectious disease consultation and IV antibiotic management.  For the  full details, please see the dictated report done by Dr. Allena Katz.   PROCEDURES AND DIAGNOSTIC STUDIES:  Chest x-ray on March 20, 2008,  showed mild chronic bibasilar atelectasis.   DISCHARGE LABORATORY VALUES:  Sodium was 138, potassium 3.7, chloride  102, bicarb 26, BUN 4, creatinine 0.66, glucose 180.  White blood cell  count was 7.5, hemoglobin 11.9, hematocrit 36.4, platelets 200.   HOSPITAL COURSE:  1. Cellulitis superimposed on severe psoriasis:  The patient was      admitted and a wound care consultation was requested.  The patient      was seen by the wound care nurse who did recommend Aquacel to her      posterior thigh lesions and also recommended a dermatology      consultation.  Infectious disease input was also sought and Dr.      Sampson Goon saw the patient and felt an urgent dermatology      evaluation was also needed.  He recommended vancomycin to cover  MRSA/MSSA and strep as well as Cipro to cover Pseudomonas and any      gram negative rod urinary tract pathogens.  The patient appeared to      respond well to this treatment.  However, her psoriatic plaques      continued to crust and it was felt that dermatology input was      strongly needed.  Unfortunately, the patient's dermatologist was      planning to leave town and could not come to the hospital to      evaluate her and no other dermatologist was willing to come to the      hospital and therefore the patient was seen at Dr. Dorita Sciara office      by one of his PAs.  A steroid cream was recommended along with      antifungal creams.  These have been prescribed and at this point,      the patient's psoriasis appears to be responding.  The patient's      cellulitis also has responded to antibiotic therapy which has been      narrowed to Avelox.  The patient will complete a full 2 week course      of antibiotics.  2.  Candida pyelonephritis:  The patient had symptoms consistent with      pyelonephritis on presentation.  Urine cultures were obtained and      only grew yeast.  Blood cultures were negative.  The patient has      been put on a course of therapy with Diflucan.  3. Oral thrush:  The patient did have oral thrush as well and the      Diflucan as well as some topical medications have relieved this.  4. Type 2 diabetes:  The patient is morbidly obese and has type 2      diabetes.  A hemoglobin A1c value was checked and found to be 6.9%      which corresponds to a mean plasma glucose of 151.  Her outpatient      regimen of glyburide appears to be controlling her diabetes      satisfactorily and no amendment has been made to this therapy other      than to provide her with sliding scale insulin while in the      hospital.  5. Hypokalemia:  The patient's potassium was appropriately repleted.  6. Morbid obesity:  The patient is morbidly obese.  She was seen in      consultation with the dietician.  The patient was able to verbalize      a diabetic diet guidelines.  She was somewhat resistant to efforts      to restrict her caloric intake while in the hospital but has been      encouraged to continue with weight loss efforts.  She been treated      with phentermine as an outpatient and we recommend resuming this      medication.  7. Antiphospholipid syndrome/Sjogren's syndrome/questionable history      of myasthenia gravis:  The patient did not have any active issues      related to these underlying medical conditions while in the      hospital.  8. Deconditioning:  The patient was resistant to working with physical      therapist here in the hospital secondary to concern about her leg      wounds weeping, periods of fecal incontinence and pain.  The  patient will be set up for home health physical therapy and      encouraged to increase her mobility to help with weight loss      efforts and  her generalized quality of life.   DISPOSITION:  The patient is medically stable and will be discharged  home.  She should follow up Dr. Terri Piedra in 2-4 weeks.  She should follow  up with new primary care physician in 1-2 weeks.   Time spent coordinating care for discharge and discharge instructions  equals 35 minutes.      Hillery Aldo, M.D.  Electronically Signed     CR/MEDQ  D:  03/31/2008  T:  03/31/2008  Job:  [id number]   cc:   Gelene Mink A. Worthy Rancher, M.D.  Fax: [phone]   Olene Craven, M.D.  Fax: [phone]

## 2010-07-17 NOTE — Discharge Summary (Signed)
NAMESILVINA, HACKLEMAN NO.:  [id number]   MEDICAL RECORD NO.:  [id number]          PATIENT TYPE:  INP   LOCATION:  1301                         [facility]ACILITY:  [facility]   PHYSICIAN:  Zannie Cove, MD     DATE OF BIRTH:  1945/09/05   DATE OF ADMISSION:  09/15/2008  DATE OF DISCHARGE:  09/24/2008                               DISCHARGE SUMMARY   ADDENDUM:  Original dictation was dictated by Dr. Trula Ore Rama on  09/20/2008.  The original dictation was [phone].   PRIMARY CARE PHYSICIAN:  Dr. Florentina Jenny.   HISTORY AND HOSPITAL COURSE:  As dictated previously.  The patient has  remained as an inpatient, to complete her 10-day course of IV  vancomycin.  She is clinically a little improved.  She will be  discharged on p.o. doxycycline for five more days, which can be  completed at home.   CONDITION ON DISCHARGE:  Stable.   DISCHARGE DIET:  Low-sodium, 1800 calorie American Diabetic Association  diet, to which the patient has not been compliant in the hospital.   DISCHARGE MEDICATIONS:  1. Glyburide 5 mg p.o. b.i.d.  2. Vitamin D 50,000 units once a week.  3. Aspirin 81 mg once daily.  4. Prilosec 20 mg once daily.  5. Imodium 2 mg q.8h. as needed.  6. Gabapentin 300 mg twice daily.  7. Questran 1 gram, two to three times daily.  8. Triamcinolone 0.1% cream, apply to infected area twice daily.  9. ProAir HFA 90 mcg, one to two puffs q.4h. as needed.  10.__________ 0.05%, apply to effected area once daily.  11.Doxycycline 100 mg p.o. b.i.d. for five more days.  12.Eucerin cream to dry areas of the skin twice daily.      Zannie Cove, MD  Electronically Signed     PJ/MEDQ  D:  09/24/2008  T:  09/24/2008  Job:  [id number]   cc:   Florentina Jenny, M.D.

## 2010-07-17 NOTE — Group Therapy Note (Signed)
NAMEBREYANA, [name] NO.:  [id number]   MEDICAL RECORD NO.:  [id number]          PATIENT TYPE:  INP   LOCATION:  1303                         FACILITY:  [name]   PHYSICIAN:  Isidor Holts, M.D.  DATE OF BIRTH:  June 30, 1945                                 PROGRESS NOTE   PRIMARY CARE PHYSICIAN:  Dr. Florentina Jenny   For details of discharge diagnoses, admission history, detailed clinical  course, refer to interim summaries dictated August 16, 2008 by Peggye Pitt, M.D. and August 18, 2008 by this MD.  For the period, however,  from August 19, 2008 to August 23, 2008, i.e. the date of this dictation,  the following are pertinent:   The patient unfortunately did not do very well on oral Bactrim, and  experienced worsening of the cellulitic changes on her right buttock and  upper thigh.  This necessitated recommencing intravenous Vancomycin on  August 20, 2008 with plans for a further 10-day course of therapy.  It is  felt that this should prove adequate to resolve the patient's  cellulitis.  Unfortunately, the patient has declined PICC line insertion  due to a previous bad experience with PICC line.  We are therefore,  constrained to continue in-hospital treatment with IV Vancomycin.  As of  August 21, 2008, it was already evident that cellulitic changes were once  again, improving.  We did do a right thigh CT scan on August 22, 2008,  because of continuing complaints from the patient about deep-seated pain  in that extremity.  This showed cellulitic changes and subcutaneous  edema, but no evidence of myofasciitis or indeed abscess.  The patient  has been reassured accordingly.  Her type 2 diabetes mellitus has remained well controlled, diarrhea has  completely resolved and the patient is now maintained on Loperamide 2 mg  p.o. q.i.d.  Her psoriatic rash continues to show steady improvement,  and there have been no problems referable to bronchial asthma.  Because  of flareup  of intertrigo in the lower abdominal skin folds as well as in  the right axilla, the patient was commenced on oral Diflucan and as of  August 23, 2008, she was on day #5 of this therapy.  In addition she is  being treated with topical Clotrimazole cream, and improvement has been  quite satisfactory.   DISPOSITION:  This will be elucidated in detail at the appropriate time, by  discharging MD, as will any updates to discharge medications and follow-  up instructions.      Isidor Holts, M.D.  Electronically Signed     CO/MEDQ  D:  08/23/2008  T:  08/23/2008  Job:  [id number]   cc:   Dr. Florentina Jenny   Dr. Melvyn Novas, Dermatology

## 2010-07-17 NOTE — Op Note (Signed)
[name], KRUCZEK NO.:  [id number]   MEDICAL RECORD NO.:  [id number]          PATIENT TYPE:  AMB   LOCATION:  DAY                          FACILITY:  [facility]   PHYSICIAN:  Sigmund I. Patsi Sears, M.D.DATE OF BIRTH:  09-Sep-1945   DATE OF PROCEDURE:  10/24/2006  DATE OF DISCHARGE:                               OPERATIVE REPORT   PREOPERATIVE DIAGNOSIS:  Urethral mass and dysuria.   POSTOPERATIVE DIAGNOSIS:  Bullous edema and dysuria.   PROCEDURE:  Cystoscopy, exam under anesthesia.   SURGEON:  Sigmund I. Patsi Sears, M.D.   ASSISTANT:  Tarri Glenn, M.D.   ANESTHESIA:  Monitored anesthesia care.   INDICATIONS FOR PROCEDURE:  Ms. Knight is a 65 year old morbidly obese  diabetic female who was noted to have possible urethral mass.  She also  has recurrent urinary tract infection, frequency, and dysuria.  She has  an indwelling Foley catheter at home and is essentially bed bound  secondary to habitus.  She does not tolerate office flexible cystoscopy.   DESCRIPTION OF PROCEDURE:  The patient was brought to the operating  room.  She was identified by arm band.  Informed consent was verified  and preoperative time out was performed.  After the successful induction  of monitored anesthesia care, the patient was frog legged.  The perineum  was prepped and draped in the usual fashion.  We then performed exam  under anesthesia.  The urethra was palpably normal.  There were no  obvious masses or prolapse.  The vaginal cuff was freely mobile.  There  were no adnexal masses.   The flexible cystoscope was inserted transurethrally into the bladder  and pancystourethroscopy was performed.  There was squamous metaplasia  along the trigone.  There were numerous areas of bullous edema  consistent with an indwelling catheter.  There were no frank masses.  There was no erythema, foreign bodies, diverticula, or stones.  Both  ureteral orifices were noted to be in their normal  anatomic position.  The urethra was also carefully inspected and it was noted to be a normal  female urethra.  At this time, the cystoscope was withdrawn.  A 24  French 30 mL Foley catheter was inserted transurethrally into the  bladder and the balloon inflated with 30 mL sterile water.  It was  placed to straight drain.  At this time, the procedure was terminated.  The patient tolerated the procedure well and  there were no complications.  Dr. Jethro Bolus was the attending  primary responsible physician and participated in all aspects of the  procedure.   DISPOSITION:  The patient was transferred safely to the post anesthesia  care unit.     ______________________________  Tarri Glenn, M.D.      Sigmund I. Patsi Sears, M.D.  Electronically Signed    JR/MEDQ  D:  10/24/2006  T:  10/25/2006  Job:  [id number]

## 2010-07-17 NOTE — H&P (Signed)
[name], [name] NO.:  [id number]   MEDICAL RECORD NO.:  [id number]          PATIENT TYPE:  INP   LOCATION:  0102                         FACILITY:  [facility]   PHYSICIAN:  Manus Gunning, MD      DATE OF BIRTH:  Sep 21, 1945   DATE OF ADMISSION:  03/20/2008  DATE OF DISCHARGE:                              HISTORY & PHYSICAL   CHIEF COMPLAINT:  Dysuria, polyuria, flank pain, nausea, vomiting,  worsening lower extremity cellulitis with pain with extension of  psoriasis and yeast infection to the breast, duration chronic.   HISTORY OF PRESENT ILLNESS:  Ms. Waszak is a 65 year old morbidly obese  bed-bound Caucasian female.  She presents with the above-noted chief  complaint.  She claims that her lower extremity has started to worsen  over a period of time and now currently has a serous discharge.  She  also claims that the psoriasis of her abdomen has extended up into the  breast, which is associated with a local yeast infection secondary to  poor aeration and has resulted in severe pain over the breasts and  nipple area.  She has been treated with Augmentin at home, which has  resulted in diarrhea and has caused worsening of her right lower  extremity, erythema, swelling, and discharge.  She has a history of  chronic psoriasis, which is a precursor to her current problems.  Add to  this, morbidly obese state and that she is bed-bound, has resulted in  multiple areas of poor aeration with yeast infection and dependent  cellulitis.  She complains of pain in these areas, for which she takes  hydrocodone.  Also, she complains of dysuria, polyuria, nausea, and  vomiting.  She describes flank pain per the ED sign-out, but during my  interview, she did not complain of any urinary symptoms.  She denies  fever.  Denies tinnitus.  No syncope, presyncope.  No odynophagia.  No  dysphagia.  Positive nausea.  Positive diarrhea.  No neck fullness.  No  chest pain, palpitations,  PND, or orthopnea.  Positive abdominal pain,  superficial areas that are affected by psoriasis as well as a yeast and  cellulitic infection.  Positive diarrhea.  No constipation.  Positive  musculoskeletal complaints.   PAST MEDICAL/SURGICAL HISTORY:  1. Morbid obesity.  2. Hypertension.  3. Diabetes mellitus type 2.  4. Asthma.  5. Arthritis.  6. Cellulitis, chronic.  7. MRSA infection.  8. Myasthenia gravis.  9. Obesity.  10.Psoriasis.  11.Sjogren's disease.  12.Antiphospholipid antibody syndrome.  13.Vaginal hysterectomy.  14.Appendectomy.  15.Cholecystectomy.  16.Pilonidal cyst repair.  17.Dental reconstruction.   ALLERGIES:  MUSCLE RELAXANTS, TETRACYCLINE.   FAMILY HISTORY:  Patient is adopted.   SOCIAL HISTORY:  Denies tobacco, illicits, or alcohol.  Lives at home  with her husband and friend.   REVIEW OF SYSTEMS:  A 14-point review of systems performed.  Pertinent  positives and negatives as dictated above.   PHYSICAL EXAMINATION:  VITALS ON PRESENTATION:  Temperature 97.7, heart  rate 108, respiratory rate 16, blood pressure 117/62, O2 sat 94% on room  air.  GENERAL:  A morbidly obese Caucasian female lying in bed in no apparent  distress.  HEENT:  Normocephalic and atraumatic.  Moist oral mucosa.  No thrush,  erythema, or post-nasal drip.  Eyes:  Anicteric.  Extraocular muscles  are intact.  Pupils are equal and reactive to light and accommodation.  NECK:  Supple.  Good range of motion.  No thyromegaly.  No bruits.  CARDIOVASCULAR:  S1 and S2 normal.  Regular rate and rhythm.  No  murmurs, rubs or gallops.  RESPIRATORY:  Air entry bilaterally equal.  No wheezes, rales or rhonchi  appreciated.  ABDOMEN:  Soft.  Positive tenderness.  Sites of erythema on the lateral  right abdominal wall.  Nondistended.  Obese.  Positive bowel sounds.  No  organomegaly.  EXTREMITIES:  No cyanosis, no clubbing.  Positive erythema, bilateral  right, much greater than left with  serous discharge.  Positive bilateral  dorsalis pedis.  Warm, well perfused.  CNS:  Alert and oriented x3.  Cranial nerves II-XII grossly intact.  Power, sensation, and reflexes are bilaterally symmetrical.  HEME/ONC:  No palpable lymphadenopathy.  Positive ecchymosis.  Negative  bruising.  Negative petechiae.  SKIN:  No ulcerations, or masses.  Positive swelling, right lower  extremity, with mild area of grade 1 breakdown over the right lower  extremity thigh posteriorly.   LABORATORY TESTS:  Lab work presentations are as follows:  WBC 11,200,  hemoglobin 13.1, hematocrit 39.8, platelets 230, polymorphs 83%.  Sodium  138, potassium 4.1, chloride 107, carbon dioxide 25, glucose 141, BUN  17, creatinine 0.6, calcium 8.7, total protein 5.8, albumin 2.7, AST 30,  ALT 18, alk phos 102, T bili 0.7, direct bili 0.1, indirect 0.6.  UA  demonstrates a glucose negative, large hemoglobin, large leukocyte  esterase, positive nitrite, WBCs too-numerous-to-count, RBCs 7-10,  bacteria many.   Chest x-ray, portable, demonstrates mild chronic bibasilar atelectasis.   ASSESSMENT/PLAN:  1. Morbid obesity resulting in cellulitis and erythema:  Start      levofloxacin and vancomycin 1500 mg IV q.12h.  Obtain wound care.      Levaquin 750 mg IV daily.  2. Urinary tract infection:  Start Levaquin and vancomycin, as      dictated above.  Take a culture and sensitivity of the urine as      well as blood culture and sensitivity.  3. Chronic Foley, most likely the cause of the urinary tract      infection.  I would obtain routine Foley care and replace Foley      today with regular routine place.  Per patient, this is scheduled      for the March 24, 2008.  4. Diarrhea:  This is most likely secondary to antibiotics, Augmentin      and Bactrim.  At this time, check for C. diff, culture and      sensitivity of the stool, and fecal WBCs.  No need to empirically      treat for C. diff at this time.  5.  Gastrointestinal/deep venous thrombosis prophylaxis:  Protonix 40      mg p.o. daily.  For DVT prophylaxis, Lovenox 40 mg subcu daily.  6. Miscellaneous:  Case manager, social worker to assess and provide      home health options so the patient can be discharged home.  Wound      care to come and assess her wounds and provide recommendations.      Nutrition and dietary to evaluate for possible recommendations as  well as diabetic teaching in-house, if possible.      Manus Gunning, MD  Electronically Signed     SP/MEDQ  D:  03/21/2008  T:  03/21/2008  Job:  [id number]

## 2010-07-17 NOTE — Discharge Summary (Signed)
NAMEODALYS, WIN NO.:  [id number]   MEDICAL RECORD NO.:  [id number]          PATIENT TYPE:  INP   LOCATION:  1303                         FACILITY:  [facility]   PHYSICIAN:  Theodosia Paling, MD    DATE OF BIRTH:  06/08/1945   DATE OF ADMISSION:  08/12/2008  DATE OF DISCHARGE:  08/24/2008                               DISCHARGE SUMMARY   PRIMARY CARE PHYSICIAN:  Dr. Florentina Jenny   Please refer to the detailed discharge summary dictated by Dr.  Isidor Holts on August 18, 2008, and then on August 23, 2008.   HOSPITAL COURSE:  Since yesterday, no new event has transpired.  The  patient's cellulitis has continued to improve on IV vancomycin.   No new imaging performed.  No new consultation performed.  No new  procedure performed.   DISCHARGE DIAGNOSES:  1. Cellulitis.  2. Cellulitis with methicillin resistant Staphylococcus aureus      sensitive to p.o. medication, however, improved only with      intravenous vancomycin.  3. Extensive psoriasis involving buttocks and thighs.  4. Diarrhea with Clostridium difficile negative.  5. Diet control diabetes mellitus.   SECONDARY DIAGNOSES:  1. Antiphospholipid antibody syndrome.  2. Sjogren's syndrome.  3. Questionable history of myasthenia gravis.  4. Morbid obesity.   DISCHARGE MEDICATIONS:   NEW MEDICATIONS ADDED:  1. Linezolid 600 mg p.o. q.12 h.  2. Percocet 5/325 mg PO Q 6 hours p.r.n.  3. Questran 4 grams p.o. q.12 h.  4. Imodium 2 mg p.o. q.6 h.  5. Neosporin ointment thigh and buttock q.12 h.   HOME MEDICATIONS TO BE CONTINUED:  1. Clobetasol cream 0.05% q.12 h topical application.  2. Prilosec 20 mg p.o. daily.  3. Vitamin D 5000 units weekly.  4. Aspirin 81 mg p.o. daily.  5. Phentermine 37.5 mg p.o. daily.  6. Glyburide 5 mg p.o. q.12 h.  7. Albuterol MDI 2 puffs q.6 h p.r.n.  8. Nystatin powder p.r.n.  9. Neurontin 300 mg monthly whenever inserting Foley catheter p.r.n.  10.Prosed DS 1-2  tablets q.12 h p.r.n. while the using Foley catheter      p.r.n.  11.Detrol LA 4 mg p.o. 1-2 tablets monthly whenever using Foley      catheter.  12.Zovirax 5% ointment p.r.n. on the evaluation of herpes zoster.  13.Ketoconazole 2% cream p.r.n. q.12 h.  14.Triamcinolone 0.1% cream 3 times a week.  15.Valium 5 mg p.o. q.h.s. p.r.n.   MEDICATIONS ON HOLD:  1. KCl 10 mEq p.o. daily p.r.n.  2. Lasix 20 mg p.o. daily p.r.n.   DISPOSITION:  The patient is going home today.  She will follow up with  Dr. Florentina Jenny within the next 3-4 days.  According to her, the  doctor's P.A. will be visiting her.  Further medication refill and  further course of action can be determined as an outpatient.  The  patient is currently clinically improved and is ready to go home.   TOTAL TIME SPENT IN DISCHARGE:  __________ discharge of patient around 1  hour.      Theodosia Paling, MD  Electronically Signed     NP/MEDQ  D:  08/24/2008  T:  08/24/2008  Job:  [id number]   cc:   Florentina Jenny, M.D.

## 2010-07-17 NOTE — H&P (Signed)
[name], [name] NO.:  [id number]   MEDICAL RECORD NO.:  [id number]          PATIENT TYPE:  INP   LOCATION:  1303                         FACILITY:  [facility]   PHYSICIAN:  Ruthy Dick, MD    DATE OF BIRTH:  08/15/1945   DATE OF ADMISSION:  08/12/2008  DATE OF DISCHARGE:                              HISTORY & PHYSICAL   The patient seen and examined in the emergency room.   PRIMARY CARE PHYSICIAN:  Dr. Redmond School.   CHIEF COMPLAINT:  Infected right thigh wound/ulcers and cellulitis.   HISTORY OF PRESENT ILLNESS:  [name] is a morbidly obese Caucasian  lady with numerous past medical history including prior cellulitis with  MRSA infection, psoriasis, pyelonephritis, type 2 diabetes mellitus,  antiphospholipid antibody syndrome, Sjogren's syndrome, history of  myasthenia gravis, who was sent to the hospital here by her primary care  physician, Dr. Redmond School.  According to this patient, Dr. Redmond School visited her  at home about a day or two ago.  Dr. Pilar Grammes physician assistant also  visited her today.  During both visits, they noticed that the wound in  her thigh was getting worse and was having a lot of drainage.  Because  of this, they thought that she is going to require antibiotics and  referred her to the hospital for admission.  Otherwise she does not  complain of any fever, but says that the wound area is very painful and  the drainage is very vigorous.  She has no chest pain, no shortness of  breath, no abdominal pain, no nausea, no vomiting, no diarrhea, no  dysuria, no frequency.  The patient has chronic Foley catheterization  and this also is a source of her issues which includes recurrent urinary  tract infection.   PAST MEDICAL HISTORY:  1. Prior cellulitis in the right thigh with MRSA isolated in the past      according to her.  2. Psoriasis.  3. Recurrent urinary tract infection.  4. Type 2 diabetes mellitus.  5. Morbid obesity.  6.  Antiphospholipid antibody syndrome.  7. Sjogren's syndrome.  8. Myasthenia gravis.  9. Asthma.  10.Arthritis.   ALLERGIES:  1. TETRACYCLINE.  2. ANALOX.   MEDICATIONS:  1. Oxycodone/acetaminophen 5/325 mg as needed.  2. Lomotil.  3. Labetalol.  4. Prilosec.  5. Vitamin D.  6. Aspirin.  7. Phentermine.  8. Glyburide.  9. Nystatin Powder.  10.Albuterol inhaler.  11.The patient also has medication which she uses when she has her      monthly catheter change and these are:  Ketoconazole, Zovirax,      gabapentin, potassium chloride, Lasix, Prilosec DS, Valium, Detrol      LA.  She only uses these medications during catheter changes and      the last time she changed her catheter was Jul 19, 2008.   SOCIAL HISTORY:  The patient lives at home and is mostly bedridden  because of pain.  She is unable to stand.  She also has back pain.  The  patient denies alcohol, tobacco and illicit drug use.   FAMILY HISTORY:  Noncontributory according to her this presentation.  Did not want to answer any specific questions.   REVIEW OF SYSTEMS:  A 12-point review of system was done and was  negative except as noted in the history of present illness.  In  addition, patient denies syncope, photophobia, denies dysuria, frequency  or urgency.   PHYSICAL EXAMINATION:  GENERAL:  Seen and examined in the emergency  room.  She is alert, oriented x3 in no acute cardiopulmonary distress.  VITAL SIGNS:  Temperature 97.8, pulse 91, respirations 18, blood  pressure 122/81 and saturating 94% on room air.  She says the pain is  about 10/10 intensity.  HEENT:  Normocephalic, atraumatic.  Pupils are equal, round and reactive  to light.  Extraocular muscles intact.  Nares patent.  NECK:  Supple.  No JVD.  No lymphadenopathy.  No thyromegaly.  CHEST:  Clear to auscultation bilaterally.  ABDOMEN:  Obese, nontender.  No hepatosplenomegaly.  EXTREMITIES:  No clubbing, no cyanosis, no edema.  Right upper thigh  in  the posterior aspect has stage I-II ulcers and areas of cellulitis  surrounding the ulcers.  CARDIOVASCULAR:  First and second heart sounds only.  CENTRAL NERVOUS SYSTEM:  Nonfocal.  Cranial nerves intact II-XII.   LABORATORY DATA:  CBC with differential is fairly normal.  BMP is also  fairly normal.  Urinalysis shows positive nitrites and large leukocyte  esterase with many bacteria, WBC's only 3-6.   ASSESSMENT:  1. Infected ulcers of the right thigh.  2. Cellulitis.  3. Recurrent urinary tract infection.  4. Type 2 diabetes mellitus.  5. Morbid obesity.  6. Antiphospholipid antibody syndrome.  7. Sjogren's syndrome.  8. Myasthenia gravis.  9. Chronic Foley catheterization.   PLAN OF CARE:  Admit this patient and cover with broad-spectrum  antibiotics for now until wound culture is returned.  Vancomycin and  Zosyn will be my coverage for now which will also be a good coverage for  urinary tract infection.  Pharmacy to manage both medications.  We will  get wound care on board, but since there is none for this weekend, we  will probably with just cover the ulcers with wet-to-dry dressing for  now.  Again as noted, wound  cultures will be determined as to what we  will continue eventually.  In the meantime, we will continue other  outpatient medications and have her on sliding-scale insulin as well.  Wound care consult will be requested.  Her catheter is due for a change  around August 19, 2008, but that this may be done sooner since the patient  does have urinary tract infection for now.  We will defer this to  primary physician on [facility] as far as the catheter change is  concerned. Time used: 1hr.      Ruthy Dick, MD  Electronically Signed     GU/MEDQ  D:  08/12/2008  T:  08/13/2008  Job:  [id number]

## 2010-07-17 NOTE — H&P (Signed)
[name], [name] NO.:  [id number]   MEDICAL RECORD NO.:  [id number]          PATIENT TYPE:  INP   LOCATION:  1301                         FACILITY:  [facility]   PHYSICIAN:  Joylene John, MD       DATE OF BIRTH:  1945/09/24   DATE OF ADMISSION:  09/15/2008  DATE OF DISCHARGE:                              HISTORY & PHYSICAL   REASON FOR ADMISSION:  The patient is being admitted to [facility] on September 15, 2008, for cellulitis of the right thigh   HISTORY OF PRESENT ILLNESS:  This is a 65 year old female with multiple  morbid conditions coming in with recurrent cellulitis, recently  discharged from the hospital in June.  Please refer to the discharge  summaries from June for more details.  The patient has history of MRSA  cellulitis which responded to IV vancomycin.  However, during that  hospitalization, she was resistant to the idea of picc line due to which  she was discharged on p.o. linezolid.  However, the patient tells me  that she could not afford the medication and did not take the prescribed  dose of linezolid.  The patient was given Cipro by her PCP for worsening  of her cellulitis.  However, cellulitis did not respond to it which  resulted in the patient coming to the ER for further management.   PAST MEDICAL HISTORY:  1. MRSA cellulitis.  2. Psoriasis.  3. Recurrent UTI.  4. Diabetes.  5. Obesity.  6. Anti phospholipid disease.  7. Myasthenia gravis.  8. Asthma.   SOCIAL HISTORY:  She lives at home, unable to stand because of chronic  pain.  No alcohol, drugs or tobacco.   FAMILY HISTORY:  No family history of autoimmune disease.   ALLERGIES:  TETRACYCLINE, MUSCLE RELAXANT.   REVIEW OF SYSTEMS:  Complains of pain at the site of cellulitis and  chronic pain, otherwise a 14 point review of systems is negative.   PHYSICAL EXAMINATION:  VITAL SIGNS:  Temperature of 98.1, pulse 94,  blood pressure 105/60, respirations 15, O2 is 92% on  room air.  GENERAL:  A morbidly obese female.  No icterus or pallor noted.  CARDIOVASCULAR:  Regular rate and rhythm.  LUNGS:  Clear to auscultation.  The patient cannot turn for posterior  auscultation because of chronic pain.  SKIN:  She has psoriatic lesion all over her body.  The right posterior  thigh has excoriations and redness.   LABORATORY DATA:  Labs show white count of 11.1, hemoglobin 13.1,  hematocrit 39.7, platelets 199.  Sodium 139, potassium 4, chloride 106,  bicarb 25, BUN 14, creatinine 0.6, glucose 103, calcium 9.6.  UA shows  moderate leukocytes.  Rest is negative.  X-ray of the femur does not  show any gas collection.   ASSESSMENT AND PLAN:  This is a 65 year old female with recurrent  cellulitis caused by MRSA, coming in with worsening of cellulitis.  The  patient did not take the linezolid as prescribed because of the expense  of the medications.  I suspect that this is MRSA cellulitis as  well, so  we will give her vancomycin IV dosing per pharmacy.  We will do wound  culture to make sure that it is the same bug and it is not resistant.  We will give her Dilaudid for pain.  We will continue her home  medications which include Neosporin ointment and Questran.  We will hold  her phentermine which is a medications for obesity.  We will hold her  gabapentin and Valium since the patient is on Dilaudid to avoid  sedation, and we will hold her glyburide since the patient has a glucose  of 103.  We will give her sliding scale instead.      Joylene John, MD  Electronically Signed     RP/MEDQ  D:  09/15/2008  T:  09/16/2008  Job:  [id number]

## 2010-07-17 NOTE — Discharge Summary (Signed)
NAMEDIVINITY, KYLER NO.:  [id number]   MEDICAL RECORD NO.:  [id number]          PATIENT TYPE:  INP   LOCATION:  1303                         FACILITY:  [facility]   PHYSICIAN:  Isidor Holts, M.D.  DATE OF BIRTH:  [birth year]   DATE OF ADMISSION:  08/12/2008  DATE OF DISCHARGE:  08/19/2008                               DISCHARGE SUMMARY   ADDENDUM:   PRIMARY MEDICAL DOCTOR:  Dr. Florentina Jenny.   DISCHARGE DIAGNOSES:  Refer to interim summary dictated August 16, 2008,  by Dr. Peggye Pitt.   HISTORY:  Refer also to above interim summary for details of admission  history, procedures, consultations, and clinical course.  For the period  however, from August 17, 2008, to August 18, 2008, the following are  pertinent:  The patient was obviously clinically practically recovered.  Cellulitic areas had cleared up quite nicely and the affected areas of  skin had dried up with hardly any exudate or evidence of purulence was  noted.  The patient clinically felt considerably better.  Diarrhea was  controlled by placing the patient on scheduled Loperamide.  Stool  samples for clostridium difficile toxin were persistently negative.  Psoriatic lesions had improved with topical treatment as prescribed by  dermatologist, and the patient's CBGs remained within normal limits.  There were no problems referable to bronchial asthma and as of August 18, 2008, the patient was considered clinically stable for discharge on August 19, 2008.  Foley catheter was changed on that date.   DISPOSITION:  Provided no acute problems arise in the interim, the  patient will be discharged on August 19, 2008.   DISCHARGE MEDICATIONS:  These have been updated as follows:  1. Aspirin 81 mg p.o. daily.  2. Questran 4 g p.o. b.i.d.  3. Clobetasol 0.05% ointment (Temovate) applied b.i.d. to affected      areas of skin.  4. Vitamin D 50,000 IU p.o. on Tuesdays.  5. Glyburide 5 mg p.o. b.i.d.  6. Neosporin  ointment apply b.i.d. to affected areas of buttocks and      right thigh.  7. Phentermine 37.5 mg p.o. daily.  8. Protonix 40 mg p.o. b.i.d. (was on 40 mg p.o. daily).  9. Triamcinolone cream 0.1% applied to skin t.i.d.  10.Oxycodone APAP 5/325 one p.o. p.r.n. q.6 hours.  11.Loperamide 4 mg p.o. t.i.d. for 3 days then 2 mg p.o. t.i.d. for 7      days, then 2 mg p.o. p.r.n. t.i.d.  12.Bactrim DS 1 p.o. b.i.d. for 7 days.   DIET:  Carbohydrate modified.   ACTIVITY:  As tolerated.   FOLLOWUP INSTRUCTIONS:  The patient is to follow up with her primary  M.D., Dr. Florentina Jenny in 1 to 2 weeks.  She has been instructed to call  for an appointment.  In addition, she is to followup with dermatologist,  Dr.  Yetta Barre, on a date to be determined.  She has been supplied with the  appropriate information and has been instructed to call for an  appointment.   SPECIAL INSTRUCTIONS:  Home health PT/OT and air mattress overlay  has  been arranged.      Isidor Holts, M.D.  Electronically Signed     CO/MEDQ  D:  08/18/2008  T:  08/18/2008  Job:  [id number]   cc:   Florentina Jenny, M.D.   Dr. Yetta Barre  Dermatology

## 2010-07-17 NOTE — Group Therapy Note (Signed)
[name], GLAUS NO.:  [id number]   MEDICAL RECORD NO.:  [id number]          PATIENT TYPE:  INP   LOCATION:  1301                         [facility]ACILITY:  [facility]   PHYSICIAN:  Hillery Aldo, M.D.   DATE OF BIRTH:  02/05/1946                                 PROGRESS NOTE   DATE OF DISCHARGE:  Pending.   PRIMARY CARE PHYSICIAN:  Florentina Jenny, MD.   CURRENT DIAGNOSES:  1. 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  2. Severe psoriasis complicating number 1.  3. Proteus mirabilis urinary tract infection versus colonization      status post Foley change with follow up cultures pending.  4. Morbid obesity.  5. Type 2 diabetes.  6. Sjogren's syndrome.  7. History of antiphospholipid antibody.  8. Questionable myasthenia gravis.  9. Chronic pain syndrome.   DISCHARGE MEDICATIONS:  Will be dictated at the time of actual discharge.   CONSULTATIONS:  None.   BRIEF ADMISSION HISTORY OF PRESENT ILLNESS:  The patient is a 65 year old female with morbid obesity who has been  bedbound at baseline.  She has had multiple episodes of MRSA cellulitis  in the setting of severe psoriasis.  She was most recently admitted from  August 12, 2008 through August 24, 2008 and ultimately discharged on  linezolid.  Unfortunately, the patient could not afford to have this  prescription filled and ultimately returned to the hospital on September 15, 2008 with worsening erythema and pain in her right side.  The patient  was subsequently admitted for treatment of relapsed MRSA cellulitis.  For full details, please see the dictated report done by Dr. Allena Katz.   PROCEDURES/DIAGNOSTIC STUDIES:  Right femur films on September 15, 2008 showed no plain film evidence of  infectious or inflammatory process.   LABORATORY DATA:  Discharge laboratory values will be dictated at time of actual  discharge.   HOSPITAL COURSE:  1. Recurrent MRSA cellulitis:  The patient has currently  completed 5      days of a planned course of 10 days of therapy with vancomycin.      The patient refuses placement of a peripherally inserted central      catheter for home infusion.  She also refuses skilled nursing home      placement for IV antibiotics.  Although she is medically stable, we      are out of treatment options other than to complete her full 10 day      course of vancomycin therapy here.  We will have the Care Manager      evaluate different home health care companies to see if any of them      would be willing to provide home infusion therapy through a      peripheral IV.  If so, the patient can likely be discharged home      for ongoing prolonged IV antibiotic therapy.  2. Severe psoriasis:  The patient does have a severe case of psoriasis      and we have continued her on topical steroids while in the      hospital.  She  will need follow up with a dermatologist.  3. Proteus mirabilis urinary tract infection versus colonization:  The      patient has a chronic indwelling Foley catheter and gets this      changed monthly.  Urine cultures done on admission grew 65,000      colonies per mL of Proteus mirabilis.  The Foley catheter was      changed on September 19, 2008 and new cultures were obtained after the      new Foley was placed.  If these turn up to be positive, would      provide her with treatment, but otherwise she likely has      colonization of her old catheter.  4. Morbid obesity:  The patient is morbidly obese.  She is on      phentermine at home, but this does not seem to have had any effect      on her ongoing obesity issues.  She is noncompliant with her      diabetic diet and often has cookies and sugary beverages brought to      her by outside friends.  She refuses a diabetic diet while in the      hospital.  She is immobile and is not likely to have any meaningful      weight loss unless she becomes more motivated.  5. Type 2 diabetes:  Again, the patient  refuses diabetic diet and is      often observed eating sugary beverages and snacks.  The patient was      evaluated by the Diabetic Coordinator who recommended adding      Lantus.  Lantus 5 units was added with the result of her fasting      glucoses ranging from 99-171.  We will continue her on Lantus and      sliding-scale insulin.  6.  Sjogren's syndrome:  No active issues.  6. History of antiphospholipid antibody:  No active issues.  7. Question of myasthenia gravis:  This diagnosis has not been      confirmed.  In fact, the patient complained of a crisis on September 17, 2008 with difficulty swallowing and excessive salivation.  She      refused a scopolamine patch and Mestinon.  She stated that her      crisis passed when she talked to a female friend.  This is not      consistent with true myasthenia gravis.  8. Asthma:  The patient has not had any bronchospasm during the course      of this hospital stay.  9. Chronic pain syndrome:  The patient has required IV narcotics and      high-dose Dilaudid for pain control.  We are attempting to attain      pain control with oral medications including the addition of      OxyContin and Percocet.  We are weaning her IV Dilaudid.  However,      she is somewhat resistant to this.  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   DISPOSITION:  The patient will require 10 days of treatment with IV vancomycin.  She  has refused skilled nursing home placement and has refused placement of  a peripherally inserted central catheter for home infusion therapy.  We  will attempt to have the Care Manager look into the possibility of  having the home health care nurse  give IV antibiotics through a  peripheral IV.  If this can be accomplished, the patient can be  discharged home with home health therapy.   A discharge summary addendum will be dictated at the time of actual  discharge.     Hillery Aldo, M.D.   Electronically Signed    CR/MEDQ  D:  09/20/2008  T:  09/20/2008  Job:  [id number]   cc:   Florentina Jenny, MD

## 2010-07-20 NOTE — Discharge Summary (Signed)
[name], [name]                        ACCOUNT NO.:  [id number]   MEDICAL RECORD NO.:  [id number]                   PATIENT TYPE:  INP   LOCATION:  5005                                 FACILITY:  [facility]   PHYSICIAN:  Deirdre Peer. Polite, M.D.              DATE OF BIRTH:  17-Jul-1945   DATE OF ADMISSION:  10/31/2003  DATE OF DISCHARGE:                                 DISCHARGE SUMMARY   DISCHARGE DIAGNOSES:  1.  Status post fall with resultant fracture of the left second metatarsal,      with resultant ambulatory dysfunction secondary to morbid obesity.  2.  Chronic pain.  3.  Reported history of myasthenia gravis.  4.  Gastroesophageal reflux disease.  5.  Psoriasis.  6.  Urinary retention.  7.  Questionable medical noncompliance.  8.  Urinary tract infection, although culture negative, will treat      empirically times a total of five days.   DISCHARGE MEDICATIONS:  1.  Cipro 250 mg one p.o. b.i.d. x3 days.  2.  Pepcid 20 mg one p.o. b.i.d.  3.  Fentanyl Duragesic patch 25 mcg q.72h.  4.  Fluoxetine 20 mg daily.  5.  Detrol LA 4 mg daily.  6.  Miralax 17 g in eight ounce of water daily.  7.  Percocet one or two tablets q.4-6h. p.r.n.  8.  Dovonex Cream b.i.d. for psoriasis.   DISPOSITION:  Patient to be discharged to a nursing facility.  The patient  will have rehab.   DATA:  CBC on admission:  White 14.2, hemoglobin 13.8, platelets 227.  Follow-up CBC within normal limits.  BMET within normal limits except for  mild hyperglycemia at 152.  The patient had a hemoglobin A1C ordered, which  was 6.9.  UA positive for leukocytes and many bacteria.  Left ankle, three  views:  Soft tissue swelling of the ankle seen, which is more prominent  along the medial malleolus.  There is no evidence of fracture-dislocation.  Left foot:  Nondisplaced fracture involving the base of the second  metatarsal with intra-articular extension, no evidence of subluxation.   HISTORY OF PRESENT  ILLNESS:  An 65 year old female with above problems,  presented to the ED after sustaining a fall in home.  In the ED, the patient  was evaluated and found to have a fracture of the left foot second  metatarsal.  Secondary to the patient's morbid obesity, the patient had  significant ambulatory dysfunction; in fact, the patient was essentially  trapped in a tub at home because she could not get up and ambulate.  Admission is deemed necessary for evaluation and treatment for patient's  safety secondary to ambulatory dysfunction and fall risk.   PAST MEDICAL HISTORY:  As stated above.   MEDICATIONS:  As stated above.   HOSPITAL COURSE:  The patient was admitted to a medicine floor bed for  evaluation and treatment of fall with  resultant fracture and secondary  ambulatory dysfunction.  The duration of the patient's hospitalization was  spent on treating the abnormal UA, which at this time is still culture-  negative, and awaiting a nursing home facility.  There were no complications  on this hospitalization, and the patient is medically stable for discharge.  The patient will continue three more days of antibiotics for presumed UTI  and will receive rehab-type therapies at the nursing home to get her back to  her baseline level of function.                                                Deirdre Peer. Polite, M.D.    RDP/MEDQ  D:  11/04/2003  T:  11/04/2003  Job:  [id number]   cc:   Meredith Staggers, M.D.  [address]  [place] [address]  Fax: [phone]

## 2010-07-20 NOTE — Discharge Summary (Signed)
[name], [name] NO.:  [id number]   MEDICAL RECORD NO.:  [id number]          PATIENT TYPE:  INP   LOCATION:  3035                         FACILITY:  [facility]   PHYSICIAN:  Hettie Holstein, D.O.    DATE OF BIRTH:  08/30/1945   DATE OF ADMISSION:  03/12/2006  DATE OF DISCHARGE:                               DISCHARGE SUMMARY   PRINCIPAL DIAGNOSIS:  Profound deconditioning and morbid obesity with  transient episode of weakness.   SECONDARY DIAGNOSES:  1. History of myasthenia gravis currently on no pharmacotherapy at      this time and status post evaluation by Dr. Sandria Manly without      exacerbation.  2. History of panniculitis for which she has been hospitalized at      [facility] and had been in the process of discharge when she      experienced this episode which necessitated readmission and      reevaluation at [facility].  The patient was to undergo a course of      outpatient Zyvox until April 01, 2006.  She was seen in      consultation by Dr. Maurice March of infectious disease during her previous      hospitalization.  For details of that course, please refer to the      summary as dictated by Dr. Isidor Holts on March 11, 2006.  3. History of psoriasis.  4. Intertrigo, currently under chronic treatment.  5. 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  6. Asthma.   ADDITIONAL DIAGNOSES:  1. Sjogren's syndrome.  2. Antiphospholipid antibody syndrome.  3. Chronic urinary incontinence.  4. Chronic Foley catheter.   ALLERGIES:  TETRACYCLINE, MUSCLE RELAXANTS.   MEDICATIONS ON TRANSFER:  1. Zyvox 600 mg twice daily to complete on April 01, 2006.  2. Lantus 60 units subcutaneously q.h.s.  3. Aspirin 81 mg daily.  4. Glyburide 5 mg twice daily.  5. Lasix 40 mg daily.  6. Methadone 10 mg every 8 hours to continue through outpatient pain      clinic.  7. Potassium 40 mEq daily when taking Lasix.  8. Prozac 20 mg daily.  9. Zantac over-the-counter 75 mg daily.  10.Lotrimin antifungal 1% cream as previously directed to lower      extremity areas.  11.Diflucan 100 mg daily until April 01, 2006.  12.Flora-Q one twice daily x1 week post-transfer.  13.Albuterol inhaler two puffs as needed q.4-6h. p.r.n.  14.Roxicodone 5 mg as needed q.4-6h. p.r.n.  15.Dovonex 0.005% cream b.i.d. to psoriatic rash p.r.n.   DISPOSITION:  At present, [name] is currently awaiting evaluation  for rehabilitation admission and qualification which is anticipated to  take place on Monday.  Of note, discharge was planned on Friday but the  chaplain had expressed some concern that there might be some domestic  safety concerns and expressed this to staff, and discharge was aborted  at that time.  This was further clarified in a discussion with Ms.  [name] that these were inaccurate and that this is not the case.  In her  home setting, I have requested  that social service be involved to assure  that this is the case.  In any event, we are in the process of arranging  ultimate disposition based on rehabilitation offerings.   HISTORY OF PRESENT ILLNESS/HOSPITAL COURSE:  For full details, refer to  the H&P please; however, briefly, [name] is a 65 year old female who  was discharged from [facility] on the day of admission here to [facility].  She had undergone and extensive course for  cellulitis/panniculitis that had responded to IV Diflucan.  She was  being discharged with Zyvox as per recommendations from infectious  disease, to conclude on April 01, 2006.  Upon attempting to transfer  from wheelchair to vehicle, she became profoundly weak and short of  breath.  There was a large component of anxiety associated with this.  In any event, she was directed for admission to [facility] for  evaluation of acute weakness.  She underwent neurology consultation and  evaluation without evidence for acute findings.  Her carotid duplex  revealed no internal carotid  artery stenosis.  She was seen by Dr. Sandria Manly  and was not felt to be in exacerbation of myasthenia gravis.  She was  transitioned to the neurology floor and followed without further  recurrence.  She actually had markedly improved with reference to her  chronic skin conditions and panniculitis from previous hospitalization  at [facility].  Her general medical status had improved, though she had  remained quite deconditioned and weak and poorly mobile.  We have  requested the assistance of rehabilitation services to offer potential  rehabilitation services prior to returning home.  At this time, she is  medically stable for transfer.  We are currently awaiting approval.  If  approval is not possible, she will certainly be suitable for discharge  home with maximal support as she was prior to arrival.      Hettie Holstein, D.O.  Electronically Signed     ESS/MEDQ  D:  03/16/2006  T:  03/16/2006  Job:  [id number]   cc:   Olene Craven, M.D.

## 2010-07-20 NOTE — H&P (Signed)
NAMEZOANNE, NEWILL NO.:  [id number]   MEDICAL RECORD NO.:  [id number]          PATIENT TYPE:  INP   LOCATION:  1831                         FACILITY:  [facility]   PHYSICIAN:  Michelene Gardener, MD    DATE OF BIRTH:  1945/12/09   DATE OF ADMISSION:  07/05/2005  DATE OF DISCHARGE:                                HISTORY & PHYSICAL   PRIMARY CARE PHYSICIAN:  The patient does not have a local doctor.   CHIEF COMPLAINT:  Increasing pain, swelling and discharge from her right leg  and right side of her abdominal wall.   HISTORY OF THE PRESENT ILLNESS:  This is a 65 year old morbidly Caucasian  female with a past medical history of multiple medical problems who presents  with the above mentioned complaints.  This patient is admitted to the [facility] service two weeks ago for the same complaint.  She was given IV  antibiotics including vancomycin and Zosyn, and  then she was discharged on  Keflex.  At that time she was doing fine.  The patient has no primary  physician so she was not followed by anyone.  She stated that she noticed an  increased discharge coming from the right side of her abdomen, which has  been increasing very much with increased pain in the same area and down to  her right leg; and, she has swelling as well.  The patient is being admitted  with cellulitis __________.   PAST MEDICAL HISTORY:  The past medical history is significant for:  1.  History of psoriasis with significant __________ exacerbations, leg      lichenifications.  2.  History of asthma.  3.  Diabetes mellitus, uncontrolled; last hemoglobin A-1-C was 8.1 on the      last admission.  4.  Depression.  5.  History of myasthenias gravis.  6.  History of Sjogren's syndrome.  7.  History of morbid obesity.  8.  Bedbound secondary to multiple comorbidities.  9.  Mild chronic pain.  10. Antiphospholipid syndrome.   MEDICATIONS:  The patient's medications include:  1.  Glyburide  10 mg twice daily.  2.  Lantus 28 mg once daily.  3.  Cozaar 20 mg once daily.  4.  Pepcid 20 mg once daily.  5.  Methadone 10 mg three times daily.  6.  Lasix 20 mg once daily.  7.  Kay-Ciel 10 mEq once daily.  8.  Triamcinolone cream to affected are three times daily.  9.  Albuterol inhaler p.r.n.  10. Aspirin 81 mg once daily.   ALLERGIES:  TETRACYCLINE.   SOCIAL HISTORY:  The patient lives at home and is totally dependent on her  husband for her care.  She has home health PT and OT.  Denies smoking,  drinking alcohol and denies recreational drug use.   FAMILY HISTORY:  The family history is significant for hypertension and  obesity.   REVIEW OF SYSTEMS:  The review of systems is positive for pain, swelling and  increasing discharge on the right side of her body including the right side  of  her abdominal wall and the right lower extremities.  CONSTITUTIONAL:  There is no fever, no lymphadenopathy, and no weakness.  HEENT:  Eyes; no  blurred vision, no pain and no redness.  ENT:  There is no hearing loss, no  ear pain and no nystagmus.  No difficulty swallowing.  RESPIRATORY:  No  cough, no wheezing and no hemoptysis,  CARDIOVASCULAR:  No chest pain, pedal  edema and no palpitations.  No syncope.  GASTROINTESTINAL:  No nausea, no  vomiting, no diarrhea and no hematemesis.  No melena and no constipation.  GENITOURINARY:  No dysuria, no hematuria and no frequency.  No nocturia.  ENDOCRINE:  No sweating.  HEMATOLOGIC:  No bruises and no bleeding.  NEUROLOGIC:  No numbness.  No tingling.  No tremors and no headaches.  The  rest of the systems review is negative.   PHYSICAL EXAMINATION:  VITAL SIGNS:  Temperature is 98.4, blood pressure  130/72, pulse 109 and respiratory rate 22.  GENERAL APPEARANCE:  The patient is a moderately obese, Caucasian female,  who is lying down in bed.  The patient is in no acute distress at the  present time.  The patient is cooperative.  HEENT:  The  head is normal.  There is no perioral erythema.  Pupils are  equal, round and react to light and accommodation.  There is no ptosis.  __________ .  There is no ear discharge present.  There is __________.  The  oral mucosa is moist and there is no pharyngeal erythema.  NECK:  The neck is supple.  No JVD.  No carotid bruits.  No lymphadenopathy.  No thyroid enlargement.  No tenderness.  HEART:  Cardiovascular exam - S1 and S2 are normal.  There are no additional  heart sounds.  There are no murmurs, no gallops or __________, although the  assessment is very hard because of the patient's obesity.  The heart sounds  are very distant and diminished.  CHEST AND LUNGS:  Respiratory examination - the patient's respirations are  18-20 with no use of accessory muscles.  No intercostal retractions.  Again,  auscultation is difficult,  but I can hear the breath sounds with no audible  wheezes, no rales and no rhonchi.  Percussion is negative for dullness or  hyperresonance.  ABDOMEN:  On abdominal examination the and is soft.  By inspection there are  multiple lesions on the abdominal wall, especially on the right side of the  abdomen, which seems to be psoriatic lesions __________  with cellulitis  __________  abdomen.  These lesions are tender to palpation.  Her liver and  spleen cannot be assessed because of the morbid obesity.  Bowel sounds are  normal.  EXTREMITIES:  __________ .  __________  the right lateral thigh.  The pedal  pulses are diminished.  SKIN:  On skin examination, again, there are multiple psoriatic lesions all  over her body and these lesions are superimposed with infection.  They are  more pronounced on the right side of her body, especially the right side of  the abdominal wall and the right lateral thigh.  There is also some  discharge from some of these lesions.  NEUROLOGIC EXAMINATION:  The patient __________ .  The neurological system cannot be assessed because of the  morbid obesity.  PSYCHIATRIC:  On psyche examination the patient is alert, awake and oriented  times three.  The patient is __________ .  She is very emotional and she is  also very __________ , and she is also very anxious about her medical  condition, although she is cooperative  during the physical exam.   LABORATORY DATA:  WBC 14.4, hemoglobin 14.3, hematocrit 42.6, MCV 82.2, and  platelets 230,000.  Sodium 134, potassium 3.7, chloride 109,  bicarb 26,  glucose 177, BUN 9, and creatinine 0.8.  Calcium 9.2.  ALT 24, AST 21,  alkaline phosphatase 100.   IMPRESSION:  This is a 65 year old morbidly obese female with worsening  cellulitis.   1.  Recurrent cellulitis.  This patient has had multiple admissions for      cellulitis, which normally superimpose her psoriatic lesions.  The last      time she was admitted was two weeks ago (where she was kept in the      hospital) and was given vancomycin and Zosyn.  She was then discharged      on Keflex.  The patient comes in again because of increased discharge      and pain with worsening of her cellulitis.   We will admit this patient to the medical floor.  We will start her again on  vancomycin and Zosyn.  We will send blood cultures.  We will obtain wound  care management.  If condition continues deteriorating then we will request  an infectious diseases consult.   1.  Diabetes mellitus.  This is uncontrolled.  On the last admission her      hemoglobin A-1-C was 8.1 and __________  from 20-28 units per day.  I      will continue her on 28 units .  I will also place her on a regular      insulin sliding scale and we will follow her fingerstick twice daily.   1.  History of asthma.  Currently this is stable.  We will continue her      inhalers as needed.   1.  Morbid obesity.  This patient is totally bed-bound and she has      __________ at home.  She depends on her husband most of the time.  I      will get __________  management  consult to evaluate her case and we will      discuss with her regarding  possible placement if she agrees.   1.  __________ .  2.  History of psoriasis.           ______________________________  Michelene Gardener, MD     NAE/MEDQ  D:  07/06/2005  T:  07/06/2005  Job:  [id number]

## 2010-07-20 NOTE — Discharge Summary (Signed)
[name], COTTERMAN NO.:  [id number]   MEDICAL RECORD NO.:  [id number]          PATIENT TYPE:  REC   LOCATION:  TPC                          FACILITY:  [facility]   PHYSICIAN:  Jackie Plum, M.D.DATE OF BIRTH:  February 20, 1946   DATE OF ADMISSION:  02/27/2005  DATE OF DISCHARGE:  09/08/2004                                 DISCHARGE SUMMARY   DIAGNOSES:  1.  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  2.  Morbid obesity.  3.  Mild superimposed cellulitis on #1, resolving.  4.  Renal insufficiency.  5.  History of diabetes.  6.  Myasthenia gravis.  7.  Failure to thrive.   MEDICATIONS AS OF February 28, 2005 (THIS MEDICATION MAY BE MODIFIED AT THE  TIME OF DISCHARGE.:  1.  Aspirin 325 mg daily.  2.  Keflex 500 mg q.i.d., last dose to be given on March 05, 2005.  3.  Lovenox 40 mg subcutaneous injection daily.  4.  Pepcid 20 mg daily.  5.  Prozac 20 mg daily.  6.  Lasix 20 mg daily.  7.  Diabeta 5 mg b.i.d.  8.  Lantus insulin 20 units subcutaneous injection q.h.s.  9.  Methadone 10 mg t.i.d.  10. Sliding scale insulin, moderate sensitivity.  11. Tramadol 50 mg q.8 h.  12. Triamcinolone 0.1% cream application t.i.d.  13. Urocit27, 2006 one tablet b.i.d.  14. Effexor 75 mg daily.  15. Ketoconazole cream applications p.r.n.   CONSULTATIONS:  Cliffton Asters, M.D., Infectious Disease.  Frederick A. Terri Piedra, M.D. of Dermatology   PROCEDURES:  Not applicable.   LABORATORY DATA:  On February 25, 2005 (this may be outdated if new labs are  done before discharge).  WBC 12.6, hemoglobin 12.5, hematocrit 7.6, MCV 18.9, platelets 240,000.  Sodium 138, potassium 3.7, chloride 102, CO2 29, glucose 122, BUN 10,  creatinine 0.7, potassium 8.7.   REASON FOR ADMISSION:  Cellulitis.   HISTORY OF PRESENT ILLNESS:  The patient presented with complaint of back  cellulitis.  She had various areas of psoriatic plaque and an area of her  right  thigh was warmer to touch and just a little bit more erythema.  She  has had some difficult taking care of herself at home.  She is deconditioned  and unable to walk much.  Her husband has a full time job.  She is therefore  admitted for further management.  During her studies, the patient was  started on IV antibiotics.  Her cellulitis improved significantly, and was  subsequently switched to p.o. Keflex.  The patient's felt that her problem  was mostly cellulitis.  I had extensive discussions with her to let her know  that her __________ psoriasis which is very extensive.  On account of this,  I got a second opinion from Dr. Orvan Falconer, who agreed that the patient's  cellulitis is indeed mild and resolvent and recommended dermatology follow  up.  The patient was seen by Dr. Terri Piedra on February 27, 2005, and he  recommended continue the patient on Triamcinolone cream and he recommended  starting the patient on systemic treatment  with 25 mg daily of Soriatane.  He recommended the patient to follow up with him in one month at which time,  the patient should have a CBC, liver function test and triglyceride and  cholesterol drawn before hand.  The patient had filled outpatient Enbrel due  to noncompliance.  He also recommended ketoconazole cream.  The patient has  been seen by PT/OT services and agree that patient would benefit from post  acute therapies as she appeared to be motivated to participate.  This  assessment is therefore being dictated in anticipation of discharge soon  pending __________ SNIF.  On rounds today, the patient does not have any  fever or chills.  She complains of some mild bruising of her abdominal wall  which was sustained during movements when she was up and about.  She has an  area of about 3 cm x 3 cm on her left abdominal area which is not bleeding,  and stable.  Blood pressure was 104/52, pulse 89, respirations 18,  temperature 98.7, __________.  Her CBG was  ___________ 3 mg but later  __________ appropriate, and she has extensive psoriatic plaques all over her  body, mostly, and does not have any evidence of significant cellulitis at  this time.  The patient's FiO2 is completed, and she is medically stable for  discharge _________ level.  Please note that the patient's medication would  include Soriatane 25 mg daily which was started yesterday by Dr. Terri Piedra.  In addition, the patient is to have a follow up appointment scheduled with  Dr. Terri Piedra in the meantime.  Before the appointment, the patient should have  CBC, liver function tests, triglycerides and cholesterol done.  ___________  available at time of her appointment with Dr. Terri Piedra.      Jackie Plum, M.D.  Electronically Signed     GO/MEDQ  D:  02/28/2005  T:  02/28/2005  Job:  [id number]

## 2010-07-20 NOTE — H&P (Signed)
[place]  Patient:    [name], [name] Visit Number: [id number] MRN: [id number]          Service Type: MED Location: [id number] Attending Physician:  Mallory Shirk. Dictated by:   Tama Headings Marina Goodell, M.D. Admit Date:  05/22/2001   CC:         Meredith Staggers, M.D.   History and Physical  DATE OF BIRTH: June 08, 1945  CHIEF COMPLAINT: Left leg pain and redness.  HISTORY OF PRESENT ILLNESS: This is a 65 year old female, who presents with left leg pain, swelling, and redness which she first noticed this morning. Her symptoms have been rapidly worsening throughout the day.  She has had fever and chills since May 19, 2001.  She has had nausea without vomiting. She has no history of cellulitis.  She does have headache with this illness. She was seen with fever in the emergency department this morning, diagnosed with probable viral syndrome.  She has no history of diabetes.  She has been scratching at the leg earlier this week.  REVIEW OF SYSTEMS: She has had redness and irritation in the groin area.  No shortness of breath and as above, otherwise unremarkable.  PAST MEDICAL HISTORY:  1. Psoriasis.  2. Chronic low back pain secondary to disk disease.  3. Morbid obesity.  4. Myasthenia gravis, in remission.  5. Antiphospholipid syndrome.  6. Sjogrens syndrome.  7. Asthma.  8. Depression.  PAST SURGICAL HISTORY:  1. Partial hysterectomy.  2. Teeth extraction.  3. Cholecystectomy.  4. Appendectomy.  5. Pilonidal cyst surgery.  MEDICATIONS:  1. Protopic q.d. p.r.n.  2. Methadone 10 mg q.i.d.  3. Aspirin 81 mg q.d.  4. Albuterol MDI p.r.n.  5. Prozac 20 mg q.d.  6. Celebrex 200 mg q.d. to b.i.d.  7. Norco p.r.n. (patient does not take this often).  8. Artificial Tears as needed.  ALLERGIES: The patient does not take TETRACYCLINE secondary to her history of myasthenia gravis.  FAMILY HISTORY: Unknown (patient  adopted).  SOCIAL HISTORY: She is married.  She is disabled.  She quit smoking in October 2001.  She quit drinking alcohol six weeks ago.  PHYSICAL EXAMINATION:  VITAL SIGNS: Temperature 98.8 degrees, blood pressure 104/60, heart rate 89, respiratory rate 20.  O2 saturation 97% on room air.  GENERAL: The patient is a morbidly obese, pleasant, and in no acute distress.  HEENT: Tympanic membranes normal.  Oropharynx clear.  Conjunctivae clear. EOMI.  She has a right eyelid droop.  CARDIOVASCULAR: Regular rate and rhythm with no murmurs, rubs, or gallops.  LUNGS: Clear to auscultation bilaterally with normal air entry.  ABDOMEN: Soft, nontender.  Positive bowel sounds.  Well-healed scar.  SKIN: Erythema, warmth, tenderness, and edema in the left lower extremity laterally tracking up the thigh to the buttock on the left.  There is no weeping or drainage.  She has scattered patches of silver scaling diffusely. In the groin area there is increased erythema.  The soles of her feet display hyperpigmentation and callous.  NEUROLOGIC: Cranial nerves II-XII intact.  LABORATORY DATA: WBC 15.8, hemoglobin 12.7, hematocrit 37.4, platelets 149,000; 92% neutrophils, 2% lymphs, 0% eosinophils, 0% basophils.  PTT 40. Blood cultures are pending.  ASSESSMENT: This is a 65 year old female with multiple medical problems, who presents with rapidly worsening left lower extremity cellulitis.  PLAN:  1. Left leg closed using.  Will admit and give IV cefazolin.  Will consider     broadening antibiotic  coverage if she has worsening or not improving.     Will follow the blood cultures.  Will give Vicodin as needed for pain.  2. Psoriasis.  Will continue Protopic as needed.  3. Depression.  Continue Prozac per home regimen.  4. Chronic low back pain.  Will continue methadone.  5. Sjogrens syndrome.  Will continue rewetting drops as needed.  6. Antiphospholipid syndrome.  Will continue aspirin each  day.  7. Asthma.  She has not had any recent exacerbations.  She does not take any     maintenance medication.  Will continue albuterol as needed.  8. Candida skin infection in the groin.  Will use Nystatin powder b.i.d.     until resolution. Dictated by:   Tama Headings Marina Goodell, M.D. Attending Physician:  Cheree Ditto D. DD:  05/22/01 TD:  05/24/01 Job: [id number]

## 2010-07-20 NOTE — Discharge Summary (Signed)
[name], [name]              ACCOUNT NO.:  [id number]   MEDICAL RECORD NO.:  [id number]          PATIENT TYPE:  INP   LOCATION:  5038                         FACILITY:  [facility]   PHYSICIAN:  Melissa L. Ladona Ridgel, MD  DATE OF BIRTH:  Jul 14, 1945   DATE OF ADMISSION:  10/13/2004  DATE OF DISCHARGE:  10/17/2004                                 DISCHARGE SUMMARY   DISCHARGE DIAGNOSES:  1.  Psoriasis with superinfection both fungal and bacterial. Patient was      admitted for topical as well as oral therapy for her psoriatic disease.      Patient frequently presents after noncompliance with medication with      increased psoriatic plaques that become superinfected with fungus and      bacteria.  Patient responded favorably to oral and topical therapy and      we discharged to home to further care.  2.  Diabetes.  Will continue on her glyburide and Lantus.  3.  Depression.  She will continue on her Effexor and Fluoxetine.  4.  Chronic pain.  She will be maintained on her methadone and Vicodin.  5.  Asthma which is at baseline.  She will continue with her MDI.  6.  Antiphospholipid syndrome.  She will continue with aspirin.  This is at      baseline without flare.   DISCHARGE MEDICATIONS:  1.  Keflex 500 mg p.o. q.8h.  2.  Prozac 20 mg daily.  3.  Effexor 75 mg daily.  4.  Penteramine 37.5 mg daily.  5.  Methadone 10 mg t.i.d.  6.  Glyburide 5 mg b.i.d.  7.  Lantus 20 units nightly.  8.  Aspirin 81 mg.  9.  Zantac 75 mg.  10. Nizoral cream to the abdomen b.i.d.  11. Triamcinolone cream b.i.d.   The patient was instructed to make an appointment with Dr. Lenise Arena in one  week.   HISTORY OF PRESENT ILLNESS:  Patient is a 65 year old obese white female who  presents with increased psoriatic plaque outbreak with obvious cellulitis  and superinfection.  The patient has recurrent fungal and bacterial  cellulitis related to her chronic skin disease and morbid obesity.  Patient  was  admitted to the hospital, started back on her topical therapies  consisting of triamcinolone and Dovonex was held.  Ketoconazole 2% cream was  used under her pannus.  She was started on oral Diflucan as well as Cipro  400 mg IV q.12h.  The patient's lesions responded favorably to the therapy  and by October 17, 2004, was deemed that she was stable for discharge to home  on oral Keflex.  Please note the patient has a chronic indwelling Foley  catheter which was maintained appropriately with frequent flushes and p.r.n.  Prosed.  On the day of discharge she is seen by Deirdre Peer. Polite, M.D.,  whose exam shows vital signs are stable, chest is clear, cardiovascular is  regular, abdomen was nontender, skin showed drying plaque.   At this time, the patient was deemed stable for discharge to home for  continued therapy with oral  antibiotics for five more days and topical  therapy.  She was to follow up with her primary care physician.      Melissa L. Ladona Ridgel, MD  Electronically Signed     MLT/MEDQ  D:  12/25/2004  T:  12/25/2004  Job:  [id number]   cc:   Joycelyn Rua, M.D.  Fax: [phone]

## 2010-07-20 NOTE — Group Therapy Note (Signed)
DATE OF VISIT:  May 14, 2004.   MEDICAL RECORD NUMBER:  [id number].   CHIEF COMPLAINT:  Low back pain and weakness.   HISTORY OF PRESENT ILLNESS:  This is a 65 year old, morbidly obese, white  female with a remote history of myesthesia gravis and a more history of  psoriasis and Sjogren's syndrome.  She has also been diagnosed with  antiphospholipid antibody syndrome.  The patient was followed until a few  years ago by the pain center at [facility].  She has not seen them  since they closed down.  She had been maintained somewhat on methadone 10 mg  five times a day, but really has not had that recently.  The patient had  facet infections around 2000.  There were followed up by RF ablations a few  months later.  She had some short-term relief with these.   The patient has become increasingly sedentary.  She is only able to walk  about 10-20 feet at a time, the distances from her bed to the bathroom.  She  is unable to try steps or drive a car.  She uses a walker for ambulation,  but generally is in a wheelchair most of the time.  She was just approved  for an electric wheelchair which she should be receiving shortly.   The patient reports that her pain is generally at a 5/10 level.  The pain is  sharp, burning, stabbing and constant.  She has had problems with  abdominal/ventral hernia.  Pain due to psoriasis.  Pain down the left leg  possibly due to sciatica.  Complains of dull, constant low back pain and  pain in the neck and shoulders.  The pain is generally worse with walking,  sitting and standing and improves with rest and pacing activities.  The pain  is worse in the evening hours.   The patient has had a indwelling catheter since August of 2005.  In August,  she was admitted after falling in the bathtub and fracturing the left foot.  She has been to a certain extent photic of reentering bathtubs since that  time.  She has had a problem with a dilated urethra and urine  sediment with  bladder spasms.   The patient's goals are to become more functional.  She wants to be more  self-sufficient.  As it is right now, she depends heavily on her husband.  Apparently the relationship with her husband is stressed at best.   PAST MEDICAL HISTORY:  Significant for:  1.  Myasthenia gravis.  2.  Gastrointestinal reflux disease.  3.  Psoriasis.  4.  Urinary retention.  5.  Morbid obesity.  6.  History of Raynaud's phenomenon.  7.  Antiphospholipid antibody syndrome.  8.  Sjogren's syndrome.  9.  Questionable sleep apnea.  10. Depression and anxiety.   PAST SURGICAL HISTORY:  History of:  1.  Hysterectomy.  2.  Cholecystectomy.  3.  Pilonidal cyst removal.   CURRENT PHYSICIANS INVOLVED IN THE PATIENT'S CARE:  1.  Tally Joe, M.D.  2.  Genene Churn. Love, M.D.  3.  Lemmie Evens, M.D.  4.  Feliberto Gottron. Turner Daniels, M.D., at the time of her left foot fracture.  She has      had no followup x-rays done of the left foot since the hospitalization.   MEDICATIONS:  1.  Methadone 10 mg five times a day, which she has not had for a few      months.  2.  Aspirin daily.  3.  Zantac daily.  4.  Prozac 40 mg daily.  5.  Phentermine 37.5 mg daily.  6.  Prosed DS one to two b.i.d. p.r.n.  7.  Cipro 250 mg b.i.d. recently for probable UTI.   ALLERGIES:  Denied.  She does avoid tetracycline due to her history of  myasthenia gravis.   FAMILY HISTORY:  Noncontributory.   SOCIAL HISTORY:  The patient is married.  She does not smoke or drink.  Other pertinent social history is mentioned above.   REVIEW OF SYSTEMS:  The patient reports weight gain, rash, easy bleeding,  diarrhea, reflux, wheezing, limb swelling, shortness of breath, asthma,  spasms, tremor, tingling in the legs, anxiety, bladder control problems and  weakness.  The full review of systems is in the health and history section  of the chart.   FUNCTIONAL HISTORY:  The patient last worked in 1976.  She needs  assistance  with bathing, dressing, toileting, meal prep, household duties and shopping.   PHYSICAL EXAMINATION:  VITAL SIGNS:  The blood pressure is 136/80, the pulse  is 103 and the respiratory rate is 16.  She is saturating 91% on room air.  WEIGHT:  415 pounds.  HEIGHT:  She is 5 feet 8 inches tall.  GENERAL APPEARANCE:  A catheter was in place with green-tinted urine which  appeared generally clear.  The patient is morbidly obese.  SKIN:  There are multiple psoriatic lesions throughout.  The area which was  largest was along the right thigh, which appeared to be a bit warm and  slight inflamed.  It did not appear overly infected today.  HEART:  Tachycardic.  CHEST:  Generally, although it was slightly difficult to auscultate due to  size.  NEUROLOGIC:  Affect was bright and appropriate.  The patient was able to  take a few steps.  Wide-based, shuffling gait.  She became anxious after  standing up for a few seconds.  Sensation was decreased in the distal lower  extremities and to an extent the distal upper extremities.  She had 2+-3+  edema in the legs.  The skin was really dry throughout.  Reflexes were trace  to 1+.  Motor exam was 3-3+ distally in the lower extremities and 2+-3  proximally.  Upper extremity strength was 3+-4/5.  The left foot was painful  along the metatarsal heads, particularly around metatarsal 3 and 4.  No  bruising was seen of the foot.  The skin was slightly cool.  Pulses were  difficult to palpate in all extremities.  Cognitively the patient was  appropriate, although a bit anxious.  The cranial nerve exam was intact.  I  attempted to exam the back today and this was very difficult due to the  patient's poor standing tolerance and obesity.   ASSESSMENT:  1.  Chronic pain due to multiple factors, including:      1.  Morbid obesity.      2.  Psoriatic arthritis.      3.  Questionable facet syndrome.      4.  Likely sleep apnea.     5.   Anxiety/depression.      6.  Old left metatarsal fracture(s?)  2.  Chronic indwelling catheter.  3.  Antiphospholipid antibody syndrome and Sjogren's syndrome.   PLAN:  1.  Will resume methadone at first at 10 mg b.i.d. and after one week      increase to t.i.d.  2.  Will obtain  x-rays of the left foot to rule out occult fracture.  3.  Send the patient for followup sleep study.  We need to place her on      appropriate nighttime assistance.  I would be shocked if this patient      does not have significant sleep dysfunction.  This certainly will      adversely effect her pain and energy levels.  4.  Regarding mood and energy, will taper off Prozac and begin a trial of      Effexor 75 mg daily.  The medication was described a length to the      patient today.  5.  Continue Cipro for bladder and this also should cover the patient's      questionable area on the right thigh.  If she should have fever or      persistent problems with the area, I would recommend following up with      her family physician.  6.  I will see the patient back in one month's time.  Would like to address      therapy at that point.  She may benefit from some psychological      counseling and dietary counseling as well.      ZTS/MedQ  D:  05/14/2004 16:06:26  T:  05/14/2004 17:57:43  Job #:  [id number]   cc:   Tally Joe, M.D.  [address]  [place], [place] [address]  Fax: [phone]

## 2010-07-20 NOTE — Discharge Summary (Signed)
[place]. [facility]  Patient:    [name], [name] Visit Number: [id number] MRN: [id number]          Service Type: MED Attending Physician:  Gracelyn Nurse Dictated by:   Gracelyn Nurse, M.D. Admit Date:  05/22/2001 Discharge Date: 06/01/2001   CC:         Meredith Staggers, M.D.  Amy Y. Swaziland, M.D.   Discharge Summary  DISCHARGE DIAGNOSES:  1. Cellulitis, left lower extremity.  2. Candidiasis in groin area and sporadic other areas.  3. Psoriasis.  4. Chronic low back pain.  5. Morbid obesity.  6. History of myasthenia gravis, currently in remission.  7. Antiphospholipid syndrome.  8. Sjogren syndrome.  9. Asthma. 10. Status post partial hysterectomy. 11. Status post teeth extraction. 12. Status post cholecystectomy. 13. Status post appendectomy. 14. Status post excision of pilonidal cyst.  PROCEDURES:  Lower extremity Dopplers showed no evidence of DVT.  HISTORY OF PRESENT ILLNESS:  This is a 65 year old white female with left leg pain, swelling, and redness.  She first noticed this in the morning and rapidly worsened throughout the day.  She has had fever and chills for the past two to three days with some nausea but no vomiting.  She was seen earlier in the morning in the ER with fever, diagnosed with probable viral syndrome, and was sent home.  She returns with this symptoms in her left leg.  ADMISSION PHYSICAL EXAMINATION:  VITAL SIGNS:  Temperature 98.8, blood pressure 104/60, heart rate 89, respirations 20.  GENERAL:  Morbidly obese white female in no acute distress.  HEENT:  Pupils are equal, round and reactive to light.  Extraocular movements intact.  Oral mucosa moist.  CARDIOVASCULAR:  Regular rate and rhythm, no murmurs.  LUNGS:  Clear to auscultation.  ABDOMEN:  Soft, nontender, nondistended.  Bowel sounds positive.  EXTREMITIES:  Left lower extremity show erythema laterally tracking up the thigh to the  buttocks.  No weeping or drainage.  Scattered patches of silver, scaly diffusely in the groin area with increased erythema.  NEUROLOGIC:  Cranial nerves II-XII grossly intact.  HOSPITAL COURSE:  #1 - LEFT LOWER EXTREMITY CELLULITIS:  The patient had erythema and edema in this leg which quickly progressed to a deep erythema, tenderness.  She was started on IV antibiotics which consisted of Ancef which was changed on the day of admission to Zosyn.  During hospital stay, the area of erythema did expand slightly, and she developed bullous lesions that would open up and weep.  After several days of IV antibiotics, the area of erythema began to contract, and edema also became less in the leg.  Infectious disease was consulted.  They elected to keep her on the Zosyn until switching her over to p.o. antibiotics.  The day before discharge, she was switched over the Keflex, and she will be continued on that for two weeks as an outpatient.  She was also seen by dermatology who recommended Silvadene cream, warm compresses, and some Xeroform gauze dressing to the area.  She will also be discharged with this treatment.  #2 - CANDIDIASIS:  She had patchy areas of this in the groin area under her pannus of her abdomen.  She was started on Diflucan and continued on that for one week.  #3 - PSORIASIS:  She has a history of this and had significant lesions on her arms and also in the groin area.  She was prescribed fumarate ointment and Dovenex cream  by dermatology, and she will be continued on this.  She will follow up with Dr. Swaziland as an outpatient.  #4 - HISTORY OF ANTIPHOSPHOLIPID SYNDROME:  While the patient was bedridden during treatment, she was given Lovenox for DVT prophylaxis.  She also had a lower extremity Doppler which showed no sign of DVT.  All other medical problems were stable during this hospitalization.  DISCHARGE LABORATORY DATA:  White count 9.6, hemoglobin 12.1, and  platelets 256.  DISPOSITION:  The patient was discharged in stable condition.  FOLLOWUP:  She will be followed up by her primary care physician, Dr. Dayton Scrape, on April 7.  She also is to call Dr. [place] at [facility] for followup appointment.  DISCHARGE MEDICATIONS: 1. Prozac 20 mg q.d. 2. Celebrex 200 mg q.d. Dictated by:   Gracelyn Nurse, M.D. Attending Physician:  Marcelino Duster D DD:  06/01/01 TD:  06/01/01 Job: [id number]

## 2010-07-20 NOTE — H&P (Signed)
[name], [name] NO.:  [id number]   MEDICAL RECORD NO.:  [id number]           PATIENT TYPE:   LOCATION:                                 FACILITY:   PHYSICIAN:  Jackie Plum, M.D.     DATE OF BIRTH:   DATE OF ADMISSION:  DATE OF DISCHARGE:                                HISTORY & PHYSICAL   CHIEF COMPLAINT:  Bad cellulitis.   HISTORY OF PRESENT ILLNESS:  Patient presents with complaints of redness and  pain in her right thigh area as well as her right groin area with yeast  infection.  Patient had previous cellulitis in similar areas for which she  was treated in the hospital with antibiotics in November.  She had a  recurrence of her symptoms yesterday with Keflex and patient without any  significant improvement, and therefore came to the ED for further  evaluation.  No history of fever or chills.  No history of cough, chest  pain, shortness of breath.  She has some headaches without any visual  changes.  At the emergency room patient was seen by Dr. Lynelle Doctor and he felt it  expedient to get patient admitted for IV antibiotic treatment.   PAST MEDICAL HISTORY:  1.  Diabetes mellitus.  2.  Morbid obesity.  3.  Myasthenia gravis.  4.  Psoriasis.   1.  She is on methadone 10 mg three to five times a day.  2.  She just completed Keflex treatment.  3.  Effexor 75 mg daily.  4.  Phentermine 37.5 mg daily.  5.  Aspirin 81 mg daily.  6.  Zantac 25 mg daily.  7.  Glyburide 5 mg t.i.d.  8.  Lantus 20 units q.h.s.   ALLERGIES:  She is allergic to MUSCLE RELAXANTS, TETRACYCLINE, ASPIRIN.   Patient does not drink alcohol or smoke cigarettes.   Review of systems as noted above, otherwise unremarkable.   PHYSICAL EXAMINATION:  GENERAL:  Patient was morbidly obese lady lying on a  stretcher; she was no in acute cardiopulmonary distress.  VITAL SIGNS:  Blood pressure 122/58, pulse 96, respirations 20, temperature  98.5 degrees Fahrenheit, her sat was 98% on  room air.  She was not in  distress.  HEENT:  Pupils equal, round, react to light.  Extraocular movements intact.  Oropharynx moist.  NECK:  Supple, no JVD.  LUNGS:  With distant breath sounds without any discernable adventitious  sounds.  CARDIAC:  Regular rhythm and rate without any gallops.  ABDOMEN:  Obese, soft, bowel sounds present.  EXTREMITIES:  No cyanosis.  Patient had pedal pitting edema with chronic  venous stasis changes.  SKIN:  Patient has numerous psoriatic lesions.  She had an area of psoriatic  lesions involving the right inner thigh and anterior thigh area which was  unchanged in color from other areas, i.e. level of __________ erythema,  however, it was obviously warmer to touch than its corresponding symmetric  area on the left thigh area.  She also has right groin with region covered  by lower abdominal wall which was wet and smeared  with antifungal cream with  little amount of erythema.   LABORATORIES:  CBC and BMET was reviewed.  Patient has leukocytosis 15,000.  Absolute neutrophil count was 12.5 __________.  Her glucose was 122 with  normal BUN and creatinine.   IMPRESSION:  Presented right thigh cellulitis with right groin wound.  Patient is admitted for antibiotics and wound care.  Patient will be on deep  venous thrombosis prophylaxis and other supportive measures included as  necessary.      Jackie Plum, M.D.  Electronically Signed     GO/MEDQ  D:  02/25/2005  T:  02/25/2005  Job:  [id number]

## 2010-07-20 NOTE — Discharge Summary (Signed)
NAMEJANNIFER, [name] NO.:  [id number]   MEDICAL RECORD NO.:  [id number]          PATIENT TYPE:  INP   LOCATION:  5732                         FACILITY:  [facility]   PHYSICIAN:  Elliot Cousin, M.D.    DATE OF BIRTH:  [birth year]   DATE OF ADMISSION:  09/03/2005  DATE OF DISCHARGE:  09/08/2005                                 DISCHARGE SUMMARY   DISCHARGE DIAGNOSES:  1.  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  2.  Diabetes mellitus.  3.  Right upper extremity mass.   DISCHARGE MEDICATIONS:  1.  Levaquin 750 mg daily for 10 more days.  2.  Triamcinolone cream 0.1% applied to the skin twice daily as needed.  3.  Diflucan 100 mg daily for five more days.  4.  Prednisone Dosepak, take as directed.  5.  Lasix 20 mg b.i.d. for three more days and then 1 pill daily.  6.  Potassium chloride 20 mEq b.i.d. for 3 days and then 1 pill daily.  7.  Prosed 1-2 tablets b.i.d. p.r.n.  8.  Lantus 60 units one injection at bedtime.  9.  Glyburide 5 mg b.i.d.  10. Prozac 20 mg daily.  11. Methadone 10 mg q.i.d.  12. Zantac 75 mg daily.  13. Aspirin 81 mg 2 tablets daily.  14. Phentermine 37.5 mg daily.  15. Nystop powder daily.   DISCHARGE DISPOSITION:  The patient was discharged home in improved and  stable condition.  She was advised to follow up with Dr. Barbee Shropshire in 2  weeks.   PROCEDURE PERFORMED:  Right upper extremity venous Doppler study.  The  results were negative for DVT, aneurysm, cyst.   HISTORY OF PRESENT ILLNESS:  The patient is a 65 year old lady, with a past  medical history significant for severe psoriasis, recurrent abdominal wall  cellulitis and diabetes mellitus, who presented to the emergency department  on July4,2007 with a chief complaint of increased redness and drainage of  the right side of her abdominal wall.  She  did have associated subjective  fever and chills.  The patient was therefore admitted for further evaluation  and management.   For additional details, please see the dictated history and physical.   HOSPITAL COURSE:  Problem 1:  CELLULITIS OF THE RIGHT ABDOMINAL WALL PANNUS  SECONDARY TO MRSA WITH SUPERIMPOSED PSORIASIS AND CANDIDAL INFECTION:  The  patient was started on antibiotic treatment with vancomycin and Diflucan  intravenously.  She was also started on treatment with local topical  triamcinolone and Nystop powder daily.  In addition, she was treated  prednisone 60 mg daily.  Specimens from the abdominal wall drainage was sent  for culture as were blood cultures.  The blood cultures remained negative  during the hospital course.  However, the abdominal wall drainage culture  grew out MRSA.  Throughout the entire hospitalization, the patient remained  afebrile.  Her white blood cell count was mildly elevated on admission at  13.2 and prior  to hospital discharge, it did increase to 17.  The increase  in leukocytosis was thought to be secondary to steroid therapy.  The patient  received 4 days of antibiotic therapy with vancomycin and 4 days of  intravenous therapy with Diflucan.  Given that the MRSA was sensitive to  Levaquin, she was discharged home on 10 more days of Levaquin 750 mg daily.  She will also be continued on treatment with Diflucan 100 mg daily for five  more days.  The prednisone will be tapered off over the course of the next  12 days.  The patient was advised to keep the abdominal wall pannus clean  and dry.   Problem 2:  TYPE 2 DIABETES MELLITUS:  The patient was maintained on Lantus  and glyburide during the hospitalization.  However, because of the steroid  therapy, she required more Lantus and sliding scale NovoLog.  Lantus was  titrated up to 60 units prior to hospital discharge.  The sliding scale  NovoLog was discontinued.  The patient was advised to  continue Lantus as  ordered as well as glyburide 5 mg b.i.d.   Problem 3:  RIGHT UPPER EXTREMITY MASS:  The patient developed an area of  swelling and erythema just lateral to the antecubital fossa of the right  arm.  The patient stated that the swelling occurred after the previous IV  was taken out and the tape that was attached to the IV was pulled off.  For  further evaluation, a venous Doppler study was ordered to rule out DVT and  an aneurysm.  The venous Doppler study was negative for DVT and  pseudoaneurysm.  There was also no evidence of a cyst.  The patient's arm  was treated with warm compresses.  Prior to hospital discharge, the swelling  had subsided.  It is important to note, there were no bruits auscultated and  the patient's radial pulse was well within normal limits.  She had no right  arm weakness on exam.   DISCHARGE LABORATORY DATA:  A TSH 1.456, WBC 17.0, hemoglobin 12.8,  platelets 253.  Blood cultures negative as of July7,2007.      Elliot Cousin, M.D.  Electronically Signed     DF/MEDQ  D:  09/09/2005  T:  09/10/2005  Job:  [id number]   cc:   Olene Craven, M.D.  Fax: [phone]

## 2010-07-20 NOTE — Consult Note (Signed)
[name], [name] NO.:  [id number]   MEDICAL RECORD NO.:  [id number]          PATIENT TYPE:  INP   LOCATION:  3035                         FACILITY:  [facility]   PHYSICIAN:  Genene Churn. Love, M.D.    DATE OF BIRTH:  November 23, 1945   DATE OF CONSULTATION:  03/12/2006  DATE OF DISCHARGE:                                 CONSULTATION   HISTORY OF PRESENT ILLNESS:  This 65 year old right-handed white female  with multiple admissions for right and left leg cellulitis with diabetes  and morbid obesity is seen for evaluation of acute weakness.   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  This was diagnosed by Tensilon test with relief of ptosis in  1979.  She was followed by Dr. Fransico Setters in [place], [place] and was on  prednisone for one year and subsequently Mestinon bromide.  She had  episodes of crises requiring admissions to intensive care unit and CT  scan of chest without evidence of thymoma present.  She has never had a  thymectomy.  She did very well and had less and less attacks between  1989 and 1990.  She was tapered off of Mestinon bromide and has been  free of anti-myasthenia medication since that time.  I first saw her in  1992, but did not have those records.  She states that occasionally she  will feel weak.  She will have an __________, and with rest this  symptomatology will resolve.  Today, she was at the discharged from was  [facility] after being treated for right leg cellulitis.  She  was placed in the standing position and developed lower extremity  weakness while standing without pain or numbness, urinary or bowel  incontinence.  She has a long history of weakness and has not been able  to walk for 2 years.  She can stand and pivot.  She has a history of a  ankle  fracture.  She has also had significant past medical history for  Sjogren's syndrome diagnosed in 2001, anticardiolipin antibody diagnosed  in 2001, psoriasis, asthma, diabetes mellitus, and obesity.  At [facility], her laboratory data has revealed admission white blood  cell count 12,200 with repeat of 8800 and 8900, hemoglobin of 13.0 with  repeats of 12.8 and 12.5 on March 10, 2006.  Serum potassium was low on  admission of 2.9.  Repeats in the 3.80 end range.  Otherwise, her basic  metabolic panel has been normal.  Her vancomycin levels have been  reported 29.5 and 26.4, and urinalysis has been unremarkable.  Her CT  scan of the pelvis March 10, 2006, ordered by Dr. Sharon Seller, has shown  minimal atelectasis.  CT scan abdomen and pelvis and lung has revealed  minimal atelectasis and scarring of the left lung base.  No acute or  significant findings of the abdomen on unenhanced study with no oral or  IV contrast.  Pelvic CT scan showed limited study with no abnormality of  the area.  Cellulitis of peripheral right thigh cannot be adequately  evaluated.  A CT scan of the brain March 08, 2006, ordered by Dr. Brien Few  for generalized headache, revealed no acute intracranial abnormality and  paranasal sinuses which were clear.   CURRENT MEDICATIONS:  Current medications are not listed at this study.   PHYSICAL EXAMINATION:  GENERAL:  Well-developed, obese female.  VITAL SIGNS:  Her heart rate was 92, blood pressure was not checked and  has been in normal range 110/80.  There were no bruits.  NEUROLOGICAL:  Mental Status:  She was alert and oriented x3, followed  one-, two- and three-step commands.  A cranial nerve examination  revealed no evidence of ptosis.  Good extraocular movements.  Good  corneals.  Face symmetric.  Tongue midline.  Uvula midline.  Gags  present.  Sternocleidomastoid, trapezius tests normal.  Motor  examination 5/5 strength in the upper and lower extremities.   Deep  tendon reflexes present.  Plantar responses downgoing.  Sensory  examination intact to pinprick and touch.   IMPRESSION:  Acute weakness, doubt myasthenia gravis as cause (358.0).  1. Sjogren's disease.  2. Antiphospholipid antibody syndrome.  3. Diabetes mellitus (250.60).  5  Asthma.  1. Morbid obesity.  2. Recurrent cellulitis.  3. Methasone resistant staph.   PLAN:  At this time, follow the patient in the hospital.  I do not  think, at this time, there is evidence of an acute attack of myasthenia  gravis.           ______________________________  Genene Churn. Sandria Manly, M.D.     JML/MEDQ  D:  03/12/2006  T:  03/13/2006  Job:  [id number]

## 2010-07-20 NOTE — Discharge Summary (Signed)
[name], FRAISER NO.:  [id number]   MEDICAL RECORD NO.:  [id number]          PATIENT TYPE:  INP   LOCATION:  1405                         FACILITY:  [facility]   PHYSICIAN:  Isidor Holts, M.D.  DATE OF BIRTH:  05-03-1945   DATE OF ADMISSION:  02/28/2006  DATE OF DISCHARGE:  03/12/2006                               DISCHARGE SUMMARY   PRIMARY CARE PHYSICIAN:  Olene Craven, M.D.   DISCHARGE DIAGNOSES:  1. Cellulitis, right lower extremity/lateral abdominal wall      panniculitis.  2. Urinary tract infection.  3. Flare up of psoriasis.  4. Intertrigo/candidiasis.  5. Bronchial asthma.  6. Type 2 diabetes mellitus.  7. Morbid obesity.  8. Chronic pain syndrome, secondary to low back pain.  9. History of myasthenia gravis.  10.Sjogren's syndrome.  11.Antiphospholipid antibody syndrome.  12.History of urinary incontinence, status post long-term Foley      catheterization.  13.Diarrhea, Clostridium difficile toxin negative.   DISCHARGE MEDICATIONS:  1. Zyvox 600 mg p.o. b.i.d. to be completed on April 01, 2006.  2. Lantus 60 units subcutaneously nightly.  3. Aspirin (enteric coated) 81 mg p.o. daily.  4. Glyburide 5 mg p.o. b.i.d.  5. Lasix 40 mg p.o. daily (was on 20 mg p.o. daily).  6. Methadone 10 mg p.o. q. 8 h.  7. K-Dur 40 mEq p.o. b.i.d. (was on 20 mEq p.o. daily).  8. Prozac 20 mg p.o. daily.  9. Zantac over-the-counter 75 mg p.o. daily.  10.Lotrimin AF 1% cream applied topically b.i.d. to skin folds of      groin and lower abdomen.  11.Diflucan 100 mg p.o. daily to be completed on March 14, 2006.  12.Dovonex (0.005%) cream applied twice daily to psoriatic rash.  13.Flora-Q 1 p.o. b.i.d. for 1 week only.  14.Albuterol inhaler 2 puffs p.r.n. q. 4-6 h.  15.Oxycodone 5 mg p.o. p.r.n. q. 4-6 h.  A total of 42 pills have been      dispensed.   PROCEDURES:  1. Right lower extremity ultrasound scan dated March 01, 2006.      This  showed no fluid collection or abscess identified in the      lateral thigh.  2. Head CT scan dated March 08, 2006.  This showed no acute      intracranial abnormality.  Paranasal sinuses are clear.  3. CT lower extremity dated March 10, 2006.  This was a limited study      showing no abnormality.  The area of cellulitis in the peripheral      right thigh could not be adequately evaluated.  4. Abdominal and pelvic CT scan dated March 10, 2006.  This showed a      ventral hernia to right of midline containing fat only.  There was      sigmoid diverticulosis.  The area of cellulitis was not adequately      evaluated due to patient's size and inability to lie on her side.      There was minimal atelectasis or scarring at the left lung base.      No acute  or significant findings in the upper abdomen.   CONSULTATIONS:  Fransisco Hertz, M.D., infectious disease.   ADMISSION HISTORY:  As in H&P note of March 01, 2007.  However in  brief, this is a 65 year old female, with multiple background medical  problems including psoriasis, Sjogren's syndrome, antiphospholipid  antibody syndrome, morbid obesity, chronic low back pain on chronic  opiate treatment, bronchial asthma, depression, recurrent abdominal  cellulitis, type 2 diabetes mellitus, chronic urinary incontinence  status post chronic Foley catheterization, who presents  with gradually  worsening psoriatic rash which had become more wide spread, as well as  increasing pain, swelling, and redness of right upper leg and thigh.  Reportedly, skin swab/scrape for bacteriologic examination done in late  November 2007, showed MRSA, and she had completed a 2-week course of  Septra by the first week of December 2007.  She presented to the  emergency department because of progressive worsening of symptoms and  was admitted for further evaluation, investigation, and management.   CLINICAL COURSE:  #1.  FLARE UP OF PSORIASIS: This was  clinically quite extensive,  affecting the patient's face, abdomen, bilateral upper and lower  extremities.  The patient had prior to admission, been treating this  with triamcinolone cream.  This was discontinued, and the lesions were  addressed with topical Dovonex 0.005% cream with appreciable improvement  in psoriatic lesions.  By the date of this dictation on March 12, 2007,  lesions have become far less angry, were no longer weeping, and  patient's overall appearance had markedly improved.  It had been  initially felt that we may be able to obtain a dermatology consultation  during the course of this hospitalization; however, we were unable to do  this because of the holiday season.  Dr. Doristine Section' office was  contacted on March 11, 2006, and he has agreed to see patient on an  outpatient basis.  Appointment has been scheduled for March 13, 2006.  It is clear that patient will need ongoing dermatologic management for  the long term.   #2.  CANDIDIASIS/INTERTRIGO:  Examination revealed extensive  intertriginous lesions in patient's skin folds, particularly in the  groin area as well as in lower abdominal area.  This was successfully  addressed with topical Lotrimin ointment as well as oral Diflucan.   #3.  CELLULITIS/PANNICULITIS:  On initial physical evaluation, the  patient was found to have weeping, angry looking psoriatic lesions right  upper thigh; swelling and tenderness of the affected lower extremity as  well as increased local temperature, all of which were consistent with  cellulitis and acute inflammatory phenomena.  At the lateral aspect of  the right thigh was an approximately 8 x 10 cm circumscribed area, which  appeared more fluctuant than the surrounding areas, raising suspicion of  possible abscess.  However, ultrasound scan of this region did not  demonstrate any collection.  The patient also had an area of inflammation which was quite extensive at the right  lateral aspect of  her lower abdomen and flank.  The area was lichenified, with thickened  skin and was weeping; i.e., consistent with panniculitis.  The patient  was managed for cellulitic and panniculitic lesions with intravenous  Vancomycin, and infectious disease consultation was called, which was  kindly provided by Dr. Lina Sayre.  Septic workup, including blood  cultures, did not reveal any organisms.  However, based on previously  reported bacteriologic study indicating MRSA in November 2007, it was  felt that this  antibiotic choice was appropriate.  As of March 11, 2006, the patient had completed a 12-day course of Vancomycin and had  experienced steady clinical improvement with diminution of acute  inflammatory phenomena.  Because the affected areas appeared to be  weeping, the patient was placed on intravenous Lasix with good  clinical effect.  We were subsequently able to switch to p.o. Lasix, and  by March 09, 2006, we were able to reduce the Lasix to 40 mg p.o.  daily.  The original plan had been to treat patient with a 4-week course  of Vancomycin per infectious disease recommendations.  However, the  patient, during this hospitalization, has consistently refused placement  of a PICC line for prolonged IV antibiotic therapy.  This is presumably  due to her pervious experience with what she calls a infected PICC  line.  Eventually, the compromise was to treat patient with oral Zyvox,  and this was started on March 11, 2006.  She is expected to complete a  3-week course of this treatment to be concluded on April 01, 2006.   #4.  URINARY TRACT INFECTION:  At the time of presentation, the patient  was found to have a positive urinary sediment consistent with urinary  tract infection.  She was treated for this with intravenous Rocephin,  and by March 05, 2006, had completed a 5-day course of this.  Repeat  urinalysis was negative, consistent with resolution of UTI.    #5.  BRONCHIAL ASTHMA:  The patient remained clinically stable from this  viewpoint on bronchodilators, throughout the hospitalization.   #6.  TYPE 2 DIABETES MELLITUS:  This was adequately managed with  carbohydrate-modified diet, scheduled Lantus insulin, and oral  hypoglycemics.  The patient remained euglycemic throughout the  hospitalization.   #7.  VULVO-VAGINAL CANDIDIASIS:  This was an incidental finding at the  time of presentation, and has been adequately addressed with Diflucan.   #8.  CHRONIC PAIN SYNDROME, SECONDARY TO LOW BACK PAIN:  The patient  continued on pre-admission Methadone, and p.r.n. analgesics.  She  remained stable from this viewpoint, throughout the hospitalization.   #9.  HISTORY OF MYASTHENIA GRAVIS:  This did not prove troublesome  during this hospitalization.   #10.  HISTORY OF DEPRESSION:  The patient's mood remained stable on  Prozac, throughout her hospitalization.  #11.  HISTORY OF SJOGREN'S SYNDROME AND ANTIPHOSPHOLIPID ANTIBODY  SYNDROME:  No problems referable to this, during the course of patient's  hospitalization.   #12.  HISTORY OF URINARY INCONTINENCE:  The patient continues on chronic  Foley catheterization.   #13.  DIARRHEA:  On March 09, 2006, the patient complained of diarrhea  with liquid stools.  Stool samples were sent off for C. difficile toxin,  and these were negative.  She was managed with probiotics; i.e., Flora-Q  and p.r.n. loperamide with good clinical effect.  By March 11, 2006,  she only had one loose bowel movement.   DISPOSITION:  Provided no acute problems arise overnight, it is  anticipated that the patient will be discharged on March 12, 2006, as  she is now sufficiently recovered and clinically stable to continue her  treatment outside the hospital environment.  It was initially felt that  patient would benefit from a period of short-term skilled nursing  facility stay, to undergo physical therapy as well as  outpatient  dermatology consultation for psoriasis.  However, the patient is  resistant to this idea, and feels that she will be better served  being  in her own home with adequate support.   DIET:  Carbohydrate-modified diet.   ACTIVITY:  As tolerated, otherwise per PT and OT.   FOLLOW UP:  1. The patient is instructed to follow up with her primary care      physician, Dr. Kern Reap, within 1 week of discharge.  She is      to call for an appointment.  2. An appointment has been scheduled for her Doristine Section, dermatology,      at [address], [address], telephone number [phone]      [phone], at 9 a.m. on March 13, 2006.  3. Home health PT, OT, and home health RN have been arranged.      Isidor Holts, M.D.  Electronically Signed     CO/MEDQ  D:  03/11/2006  T:  03/11/2006  Job:  [id number]   cc:   Olene Craven, M.D.  Fax: [phone]   Karie Soda. Joseph Art, M.D.  Fax: [phone]

## 2010-07-20 NOTE — Discharge Summary (Signed)
NAMEMARABELLE, [name] NO.:  [id number]   MEDICAL RECORD NO.:  [id number]          PATIENT TYPE:  INP   LOCATION:  6741                         FACILITY:  [facility]   PHYSICIAN:  Deirdre Peer. Polite, M.D. DATE OF BIRTH:  [birth year]   DATE OF ADMISSION:  06/22/2005  DATE OF DISCHARGE:  06/27/2005                                 DISCHARGE SUMMARY   DISCHARGE DIAGNOSES:  1.  Right abdominal/pannus infection, fungal with secondary bacterial      infection; cultures negative at time of discharge.  Will continue 10-day      course of antibiotics Keflex 500 mg q.i.d.  2.  History of psoriasis with significant secondary excoriations and plaque-      like lichenifications.  3.  History of asthma.  4.  Diabetes, uncontrolled; A1c this admission 8.1.  Glyburide increased to      10 mg b.i.d., Lantus increased to 28 units.  5.  Depression.  6.  Reported history of myasthenia gravis and Sjogren's syndrome.  7.  Morbid obesity.  8.  Essentially bed-bound secondary to multiple comorbidities.   DISCHARGE MEDICATIONS:  1.  Keflex 500 mg one every 6 hours x10 days.  2.  New dose of glyburide 10 mg b.i.d.  3.  Lantus 28 units daily.   The patient will resume other outpatient medications:  1.  Prozac 20 mg daily.  2.  Pepcid 20 mg daily.  3.  Methadone with a transient increase in dose to 20 mg t.i.d. x1 week,      then resume 10 mg t.i.d.  4.  Lasix 20 mg daily.  5.  KCl 10 mEq daily.  6.  Triamcinolone cream t.i.d.  7.  Albuterol q.i.d. p.r.n.  8.  Aspirin 81 mg daily.   DISPOSITION:  The patient is being discharged to home in stable condition.  Will be followed up by home health PT and OT.  The patient has been made  aware that she needs to secure a primary M.D. to continue services with  these programs.   STUDIES:  The patient has had a hemoglobin A1c 8.1.  Blood cultures:  No  growth to date.  Abdominal x-ray which shows some gaseous distention of the  stomach.  BMET,  CBC within normal limits.   HISTORY OF PRESENT ILLNESS:  A 65 year old female with multiple medical  problems presented to the ED with increasing pain, swelling, and drainage  from the right leg and pannus.  Admission was deemed necessary for further  evaluation and treatment.  Please see dictated H&P for further details.   PAST MEDICAL HISTORY:  As stated above.   MEDICATIONS:  Per admission H&P.   HOSPITAL COURSE:  #1 - The patient was admitted to a medicine floor bed for  evaluation and treatment of pannus infection, more than likely fungal plus  or minus bacterial superinfection.  The patient was empirically started on  broad-spectrum antibiotics and was pan cultured.  Throughout this  hospitalization the patient remained afebrile, had decrease in her wbc  count.  Cultures were followed up and ultimately were determined to be  negative.  The patient's antibiotics were streamlined to Keflex.  The  patient's areas of drainage improved dramatically.  It is felt that hygiene  is probably more of a factor because of recurrent drainage and infection  than anything else.  After adequate hygiene the patient had significant  decrease in odor and drainage.  The patient will continue Keflex as outlined  above.  The patient will continue to require aggressive hygiene of that  area, keeping the pannus clean and dry and using the steroid and antifungal  creams as prescribed.   #2 - DIABETES.  As stated, A1c is 8.1 which is greater than optimal.  The  patient's medications have been titrated and will probably need to be  continued to be titrated on an outpatient basis.   The patient has several other chronic medical problems.  She will continue  her medications as outlined above at this time.  It is felt the patient is  stable for discharge home.      Deirdre Peer. Polite, M.D.  Electronically Signed     RDP/MEDQ  D:  06/27/2005  T:  06/28/2005  Job:  [id number]

## 2010-07-20 NOTE — Discharge Summary (Signed)
[name], [name] NO.:  [id number]   MEDICAL RECORD NO.:  [id number]          PATIENT TYPE:  INP   LOCATION:  6705                         FACILITY:  [facility]   PHYSICIAN:  Jackie Plum, M.D.DATE OF BIRTH:  [birth year]   DATE OF ADMISSION:  05/07/2005  DATE OF DISCHARGE:  05/11/2005                                 DISCHARGE SUMMARY   DISCHARGE DIAGNOSES:  1.  Bronchial asthma exacerbation, resolved.  2.  Diabetes mellitus with noncompliance to diet regimen and medications,      improved.  The patient needs to continue outpatient adjustment of her      medications.  3.  Headaches with rhinorrhea complaints, probable sinusitis.  The patient      started on Sudafed and analgesics for supportive care.  4.  Extensive psoriasis.  5.  Functional disability secondary to severe morbid obesity.  The patient      refuses skilled/rehabilitation placement.  6.  History of myasthenia gravis, antiphospholipid syndrome, and Sjogren      syndrome.  7.  History of allergy to TETRACYCLINE.   The patient is discharged on her preadmission medications.  Please see H&P  by Dr. Tresa Endo, dated May 07, 2005.  New medicines are:  1.  Sudafed 60 b.i.d.  2.  Darvocet-N 100 2 tablets q.6h. p.r.n.  3.  Combivent MDI 2 puffs q.i.d.  4.  Tussionex 5 mg p.o. b.i.d.   The patient was admitted by Dr. Tresa Endo for mild asthma exacerbation.  She  presented with complaints of nonproductive cough, wheezing.  X-ray did not  reveal any acute infiltrate.  Admitting physical exam, by Dr. Tresa Endo,  indicated respiratory wheezes.  She was therefore admitted for further  management of presumptive acute asthmatic bronchitis.  On admission, the  patient was continued on nebs and antibiotics as well as steroids.  She had  problems with blood sugars going up.  We had told her this was mainly  secondary to her steroids, however, we found out that the patient actually  has been keeping candy bars and  other food substances as well as Coke that  was contributory to this problem.  We confronted her yesterday and after  further discussion she agreed not to do so.  Before this time had already  increased her anti-diabetic regimen and her CBG today has been 100.  In  light of the fact that she had been taking in some food substance which is  worsening her glycemic control, we have decided not to make any adjustments  at this time permanent.  She is supposed to go home on her preadmission  regimen and hopefully adjustments to be done by the PCP whom I have spoken  to already.  The patient has been very difficult with compliance with  medicines.  Indeed at one point, she refused her oral hypoglycemic  medications at which time I called her PCP, Dr. Stacie Acres, who had to have a  consultation with her before she agreed to take them.  By and large, I think  this patient tries to get her sugars high so she can get  hospitalized.  She  remained with sats of high 80s in the 88 and 89% range, comfortable without  any oxygen, which she has been refusing persistently since most of her time  here during her admission.   PHYSICAL EXAMINATION:  GENERAL:  The patient was comfortable, not in  distress, complains of headaches and some sinus drainage with postnasal  drip.  She is comfortable, alert, and appropriate.  THROAT:  Her pharynx did not reveal any erythema or exudation.  NECK:  She is not having any cervical lymphadenopathy.  LUNGS:  Indicated improved air entry without any wheezes.  CARDIAC:  The patient was tachycardic with no gallops.   She is deemed appropriate for discharge with outpatient followup with PCP.  I have got an appointment for her to see her PCP on Monday, April 15, 2005 at 2 p.m.  She understands that she will have to make this appointment.  The patient is discharged in stable and satisfactory condition.      Jackie Plum, M.D.  Electronically Signed     GO/MEDQ  D:   05/11/2005  T:  05/11/2005  Job:  [id number]   cc:   Royetta Crochet, MD  Fax: [phone]

## 2010-07-20 NOTE — H&P (Signed)
NAMEDESHAUNA, CAYSON NO.:  [id number]   MEDICAL RECORD NO.:  [id number]          PATIENT TYPE:  EMS   LOCATION:  ED                           FACILITY:  [facility]   PHYSICIAN:  Kela Millin, M.D.DATE OF BIRTH:  04-Sep-1945   DATE OF ADMISSION:  07/24/2004  DATE OF DISCHARGE:                                HISTORY & PHYSICAL   PRIMARY CARE PHYSICIAN:  Dr. Azucena Cecil.   CHIEF COMPLAINT:  Right lower extremity redness with increased upper thigh  swelling and abdominal wall tenderness.   HISTORY OF PRESENT ILLNESS:  The patient is a 65 year old, morbidly obese  white female with a past medical history significant for psoriasis, chronic  pain with deconditioning, diabetes mellitus, antiphospholipid syndrome,  myasthenia gravis, Sjogren's syndrome, depression who presents with the  above complaints.  She states that in the past several days she has noticed  increased redness in her right lower extremity in the area of the psoriatic  plaques.  She has also noticed increased warmth and swelling in her right  upper thigh, also affected by psoriasis.  She denies fevers.  She admits to  nausea and vomiting.  She has a chronic Foley catheter that is usually  changed by [facility].  The patient also reports that about 2-3 days  ago she began experiencing some tenderness over her abdominal area just to  the right of her midline incision/scar from previous surgeries.  She states  that the tenderness is right over the area where she has a hernia that is  followed by surgery.  In the emergency room, she stated that she only felt  any discomfort in that area by touching/palpating over that area.   In the emergency room, the patient had a CBC done which showed an elevated  white cell count, and also a CT abdomen was done, and it showed a ventral  hernia with no evidence of incarceration.  The patient is admitted to the  [facility] hospitalist service for IV antibiotics and  further evaluation.   PAST MEDICAL HISTORY:  1.  As stated above.  2.  A history of asthma.   PAST SURGICAL HISTORY:  1.  Partial hysterectomy.  2.  Cholecystectomy.  3.  Appendectomy.  4.  Pilonidal cyst surgery.   MEDICATIONS:  1.  Lantus 20 units subcu q.h.s.  2.  Methadone 10 mg p.o. 5 times a day.  3.  Phentermine 37.5 mg daily.  4.  Colace 100 mg daily.  5.  Prosed DS 1-2 tablets b.i.d. p.r.n.  6.  Ketoconazole cream 2% b.i.d. to affected areas.  7.  Effexor 75 mg daily.  8.  Aspirin 81 mg daily.  9.  Prozac 20 mg daily.  10. Xanax 150 mg daily.  11. Dovonex 0.05% cream b.i.d.  12. Triamcinolone cream 0.1% b.i.d.  13. Artificial tears, 1 drop p.r.n.  14. Vicodin 1-2 p.o. p.r.n.  15. Saline nose drops p.r.n.   ALLERGIES:  TETRACYCLINE and MUSCLE RELAXANTS (due to patient's myasthenia  gravis).   SOCIAL HISTORY:  She quit smoking in 2001, occasional alcohol use.   FAMILY  HISTORY:  Unknown (the patient was adopted).   REVIEW OF SYSTEMS:  As per HPI.  Other review of systems negative.   PHYSICAL EXAMINATION:  GENERAL:  The patient is a very pleasant, morbidly  obese, middle aged white female, in no apparent distress.  VITAL SIGNS:  Temperature is 99.2 with a blood pressure of 117/70, pulse 96,  respiratory rate 20.  O2 saturation is 96%.  HEENT:  PERRL, EOMI.  Sclerae are anicteric.  No oral exudates.  NECK:  Supple, no adenopathy.  No thyromegaly.  LUNGS:  Decreased breath sounds at the bases.  Otherwise clear to  auscultation bilaterally.  CARDIOVASCULAR:  Regular rate and rhythm.  Normal S1 and S2.  ABDOMEN:  Obese.  Bowel sounds are present.  There is a well-healed midline  scar noted, and to the right of that scar is a reducible hernia, area mildly  tender to palpation.  No rebound tenderness.  No masses are palpable.  EXTREMITIES:  She has psoriatic plaques on all four extremities.  On the  upper extremities there are pretty typical psoriatic lesions with  silvery  scales.  On the right lower extremity, the psoriatic plaques are much  thicker with markedly erythematous bases, and on the lateral aspect of her  upper thigh the area is more edematous with more of __________ lesions and  some eschars noted.  They are also warm to touch but not tender to  palpation.  CNS:  Cranial nerves II-XII are grossly intact.  Alert and oriented times  three.  A nonfocal exam.   LABORATORY DATA:  CT scan of the abdomen shows moderate right supraumbilical  ventral hernia containing fat.  It is noted that she is status post  cholecystectomy and hysterectomy.  __________ hepatosplenomegaly status post  chole __________.  Her white cell count is 15.8 with a hemoglobin of 13.9,  hematocrit 42.4.  Platelet count is 216, and the neutrophil count is 89%.  Her urinalysis is nitrite positive with small leukocyte esterase and 3-6  WBCs.  Sodium is 137 with a potassium of 3.9.  Chloride is 102, CO2 28,  glucose 149, BUN 9, creatinine 0.7, calcium 8.7.  Her albumin is 3.  Total  protein is 6.8.  Her AST is 21, ALT 20, alkaline phosphatase 122, total  bilirubin 0.2.   ASSESSMENT/PLAN:  1.  Psoriasis with right lower extremity superinfection, secondary      cellulitis - we will obtain blood cultures and start empiric IV      antibiotics.  We will continue psoriasis topical medications.  2.  Ventral hernia - CT scan of the abdomen has no evidence of      incarceration.  The patient is followed by surgery.  Procedure schedule      pending weight loss.  Continue symptomatic management and follow.  3.  Diabetes mellitus - Accu-Chek monitoring, continue Lantus, glyburide and      sliding scale coverage.  4.  Chronic pain - continue methadone.  5.  Depression - will continue Effexor and Prozac.  6.  Pyuria - in a patient with chronic Foley catheter.  We will obtain urine      cultures, follow and treat as appropriate. 7.  Antiphospholipid syndrome - we will continue  aspirin.  8.  Sjogren's syndrome - we will continue artificial tears and saline nasal      spray.  9.  Morbid obesity - we will continue phentermine.      ACV/MEDQ  D:  07/24/2004  T:  07/24/2004  Job:  [id number]   cc:   Tally Joe, M.D.  [address] [place] [address]  [place], [place] [address]  Fax: [phone]

## 2010-07-20 NOTE — H&P (Signed)
[name], [name] NO.:  [id number]   MEDICAL RECORD NO.:  [id number]          PATIENT TYPE:  INP   LOCATION:  6705                         FACILITY:  [facility]   PHYSICIAN:  Sherin Quarry, MD      DATE OF BIRTH:  1945-03-08   DATE OF ADMISSION:  05/07/2005  DATE OF DISCHARGE:                                HISTORY & PHYSICAL   HISTORY OF PRESENT ILLNESS:  [name] is a 65 year old lady with a  history of morbid obesity who is currently unable to stand and is confined  to bed.  She has a past history of psoriasis, chronic pain, antiphospholipid  syndrome and diabetes.  Her husband, who is remarkable in his devotion to  [name], has had increasing difficulty in caring for her, as she is  unable to get out of bed and is barely able to turn from side to side.  About 8 days ago, she developed a cough, which was non-productive.  Over the  last 24 hours, she has been experiencing increased wheezing and coughing.  She presented to the emergency room today where she was noted to have a  blood pressure of 154/55, pulse of 88.  O2 saturation was 99%.  A chest x-  ray was obtained, which showed no acute infiltrates.  Therefore, [name]  is admitted at this time for evaluation of what appears to be an episode of  asthmatic bronchitis.  [name] states that she has had episodes like  this in the past, but not for several years.  She has had no fevers or  chills.  No chest pain.  No nausea or vomiting.  A second problem is Mrs.  [facility] medical care.  In the past, she has refused placement, although she  is essentially impossible to care for at home.  Her husband states that he  is extremely frustrated because, although he would very much like to care  for her, he finds it to be physically impossible.   PAST MEDICAL HISTORY:   CURRENT MEDICATIONS:  These are not completely certain at this time.  I  believe that her medications consist of -  1.  Aspirin 325 mg  daily.  2.  Glyburide 5 mg b.i.d.  3.  Prozac 20 mg daily.  4.  Zantac 150 mg daily.  5.  Lantus insulin 20 units daily.  6.  Methadone 10 mg t.i.d.  7.  Lasix 20 mg daily.  8.  Imodium p.r.n.  9.  Albuterol p.r.n.  10. Also, she is applying triamcinolone 0.1% cream to her skin t.i.d.  11. She is taking Urocit one tablet b.i.d.  12. She is also applying ketoconazole cream to her skin on a p.r.n. basis.   ALLERGIES:  TETRACYCLINE.   MEDICAL ILLNESSES:  1.  Depression.  2.  Disabling morbid obesity.  3.  Psoriasis.  4.  Diabetes.  5.  Chronic pain.  6.  Antiphospholipid syndrome.  7.  Recurrent episodes of cellulitis and panniculitis.   FAMILY HISTORY:  Noncontributory.   SOCIAL HISTORY:  The patient is confined to bed and  is completely dependent  on her husband for care.  She has a Foley catheter in place.  She does not  abuse alcohol or take any non-prescription medications.   REVIEW OF SYSTEMS:  HEAD:  She denies headache or dizziness.  EYES:  She  denies visual blurring or diplopia.  EARS/NOSE/THROAT:  Denies earache,  sinus pain or sore throat.  CHEST:  See above.  CARDIOVASCULAR:  Denies  orthopnea or PND.  GI:  Denies nausea, vomiting, abdominal pain, change in  bowel habits, melena or hematochezia.  GU:  Denies dysuria.  She has a Foley  catheter in place.  NEUROLOGIC:  No history of seizure or stroke.  ENDOCRINE:  Denies excessive third, urinary frequency or nocturia.   LABORATORY DATA:  So far, we do not have any labs back on this patient.   PHYSICAL EXAMINATION:  HEENT:  Within normal limits.  CHEST:  Remarkable for diffuse expiratory wheezes.  CARDIOVASCULAR:  Normal S1 and S2 without rubs, murmurs or gallops.  ABDOMEN:  Soft.  There are normal bowel sounds.  There are no masses or  tenderness.  NEUROLOGIC:  Within normal limits.  SKIN/EXTREMITIES:  Somewhat difficult to describe.  The patient has  extensive psoriatic plaques on her abdomen and extremities.   She has  ulcerations under her panniculus, as well as in the buttock areas.  She has  extreme thickening of the skin, which she describes as cellulitis, on her  flanks.   IMPRESSION:  1.  Acute asthmatic bronchitis.  2.  Extensive psoriasis.  3.  Superficial cellulitis.  4.  Total disability secondary to morbid obesity.  5.  Diabetes.  6.  History of myasthenia gravis.  7.  Depression.   PLAN:  On an initial basis, we will give her breathing treatments with  albuterol, oxygen and antibiotics.  I will give her a tapering schedule of  steroids.  Will follow her blood sugar closely.  On a more long-term basis,  it appears apparent that [name] would be impossible to care for at  home, no matter how devoted someone was, and she is going to have to have  some type of care outside the home.  Her husband certainly seems to agree  with this concept.  We will pursue this possibility, although I realize it  has been pursued in the past.           ______________________________  Sherin Quarry, MD     SY/MEDQ  D:  05/07/2005  T:  05/08/2005  Job:  [id number]   cc:   Joycelyn Rua, M.D.  Fax: [phone]

## 2010-07-20 NOTE — H&P (Signed)
[name], DINKEL NO.:  [id number]   MEDICAL RECORD NO.:  [id number]          PATIENT TYPE:  INP   LOCATION:  1405                         FACILITY:  [facility]   PHYSICIAN:  Isidor Holts, M.D.  DATE OF BIRTH:  1946-02-10   DATE OF ADMISSION:  02/28/2006  DATE OF DISCHARGE:                              HISTORY & PHYSICAL   CHIEF COMPLAINT:  Worsening psoriasis, also pain, swelling, redness  right leg.   HISTORY OF PRESENT ILLNESS:  This is a 65 year old female.  For past  medical history, see below.  According to the patient, she was admitted  to [facility] July 2007 for recurrent cellulitis of the right  side of her abdominal wall.  Was subsequently discharged in satisfactory  condition on oral antibiotic treatment, which she continued for a period  of 24 days.  Following that, her psoriasis actually appears to have  worsened and has become more widespread.  This has troubled her so much  so, that she eventually appealed to her primary M.D., Dr. Kern Reap; to have a community nurse come to her home and take some  swabs/skin scrape for microbiologic examination.  This was carried out  according to the patient, in late November2007 and reportedly the  results showed MRSA.  She was therefore placed on a course of Septra  which she took for approximately 2 weeks, completing this in the first  week of December 2007.  According to the patient, her symptoms have  become progressive and over the past 6 days she has experienced  increasing pain, swelling,  and redness right upper thigh.  In addition,  she experiences urinary leakage although she has had a chronic  indwelling Foley catheter for approximately 2 years and this is changed  every month. Apparently, last change was February 23, 2006.  She  complains of increasing urinary frequency and dysuria and feels that she  has a vaginal yeast infection.  Presently, the patient is not under  the care  of a dermatologist and utilizes triamcinolone cream for her  psoriatic rash.  She came to the Emergency Department because of  worsening symptoms.   PAST MEDICAL HISTORY:  1. Psoriasis diagnosed 2000.  2. Sjogren's syndrome.  3. Antiphospholipid antibody syndrome.  4. Chronic low back pain, on opioid treatment.  5. Morbid obesity.  6. Bronchial asthma.  7. Depression.  8. Recurrent abdominal wall cellulitis.  9. Type 2 diabetes mellitus.  10.Myasthenia gravis.  11.Urinary incontinence. Has chronic Foley catheter for 2 years now.   MEDICATION HISTORY:  1. Aspirin 81 mg p.o. daily.  2. Glyburide 5 mg p.o. b.i.d.  3. Lasix 20 mg p.o. daily.  4. Methadone 10 mg p.o. t.i.d.  5. KCL 20 mEq p.o. daily.  6. Prozac 20 mg p.o. daily.  7. Zantac OTC 75 mg p.o. 30 minutes prior to Prozac.  8. Lantus 60 units subcutaneously nightly.  9. Albuterol MDI p.r.n.  10.Triamcinolone 0.1% cream b.i.d. p.r.n. topically.  11.2% ketoconazole cream b.i.d. topically.  12.Cetaphil OTC p.r.n.  13.Phentermine 37.5 mg p.o. alternate day.  14.Nystatin vaginal p.r.n.  ALLERGIES:  TETRACYCLINE, MUSCLE RELAXANTS--THESE ARE CONTRAINDICATED  SECONDARY TO MYASTHENIA.   SYSTEMS REVIEW:  Essentially as per HPI and chief complaint.  The  patient denies fever; denies vomiting, abdominal pain, or diarrhea;  denies shortness of breath.   SOCIAL HISTORY:  The patient is married and has four offspring.  She has  been on disability since March 1979.  Ex-smoker; used to smoke one  packet of cigarettes per day since age 49 years, but quit 2001.  Drinks  alcohol very, very occasionally.   FAMILY HISTORY:  Noncontributory.   PHYSICAL EXAMINATION:  VITALS:  Temperature 99.2, pulse 109 per minute  regular, respiratory rate 22, BP 104/57 mmHg, pulse oximeter 94% on room  air.  GENERAL:  The patient appears quite comfortable, alert, communicative,  not short of breath at rest, not in obvious acute distress.  SKIN:   She is covered in a psoriatic rash which affects her face,  bilateral upper extremities, her trunk, bilateral lower extremities.  She also has intertriginous areas under both breasts bilaterally and  also worse in the skin folds in the crural regions bilaterally.  HEENT: No clinical pallor.  No jaundice.  No conjunctival injection.  Throat: Visible mucous membranes appear quite dry.  NECK: Supple.  JVP  not seen, secondary to a fat neck.  CHEST:  Clinically clear to auscultation.  No wheezes or crackles.  HEART:  Heart sounds 1 and 2 heard, normal, regular.  No murmurs.  ABDOMEN:  Morbidly obese, soft, nontender.  Unable to palpate organs.  The patient has area of brawny discoloration of the skin right flank  with the skin thick and lichenified with psoriatic lesions.  This area  however is nontender.  There is no increased local temperature to  suggest acute inflammation.  LOWER EXTREMITY EXAMINATION:  No pitting edema.  Palpable peripheral  pulses.  The patient has tenderness of right upper thigh with a fairly  circumscribed area in the lateral aspect approximately 8 cm x 10 cm  which appears soft more fluctuant than surrounding areas, raising the  suspicion of a possible abscess.  MUSCULOSKELETAL SYSTEM:  Not formally examined.  CENTRAL NERVOUS SYSTEM:  No focal neurologic deficit on gross  examination.   INVESTIGATIONS:  CBC:  WBC 21.4, hemoglobin 13.5, hematocrit 41.0,  platelets 259.  Electrolytes:  Sodium 137, potassium 3.7, chloride 100,  CO2 32, BUN 5, creatinine 0.8, glucose 130.  Urinalysis shows WBCs too  numerous to count, RBCs 3-6, bacteria  many,  also uric acid crystals.   ASSESSMENT AND PLAN:  1. Extensive psoriasis.  This appears to be progressive.  The patient      is not currently under the care of a dermatologist.  We shall try      Dovonex 0.005% cream topically b.i.d. avoiding face and mucous     membranes.  However, it is clear that following discharge, the       patient will need a dermatologist, we expect her primary medical      doctor to arrange this.   1. Cellulitis, right upper thigh.  The patient has previously been      admitted for cellulitis in the past, and this has mainly  affected      the abdominal wall.  However, on this occasion, area of acute      inflammation appears to be the right upper thigh.  I agree with      Vancomycin already started in the Emergency Department, based on  the information we possess about the possibility of methicillin-      resistant Staphylococcus aureus. We shall complete septic workup,      including blood cultures and also do an ultrasound of the right      thigh area of fluctuance to rule out possible abscess.   1. Urinary tract infection.  We shall commence the patient on      intravenous Rocephin for now.  Send off urine for culture and      sensitivity.   1. History of myasthenia gravis.  This appears stable at present.   1. Intertrigo.  We shall utilize topical Lotrimin cream.   1. Bronchial asthma.  This appears asymptomatic at present, we shall      utilize bronchodilator nebulizers p.r.n.   1. Type 2 diabetes mellitus, query control.  We shall place the      patient on carbohydrate-modified diet; scheduled Lantus insulin in      pre-admission dosages, continue glyburide, and utilize sliding      scale insulin coverage.   1. History of chronic pain syndrome, secondary to chronic low back      pain.  Continue pre-admission analgesics.   1. Morbid obesity.  The patient will require deep venous thrombosis      prophylaxis.   1. Vulvovaginal candidiasis.  We shall treat with Diflucan 150 mg p.o.      x1 dose.   Further management will depend on clinical course.      Isidor Holts, M.D.  Electronically Signed     CO/MEDQ  D:  02/28/2006  T:  03/01/2006  Job:  [id number]   cc:   Olene Craven, M.D.  Fax: [phone]

## 2010-07-20 NOTE — H&P (Signed)
[name], [name] NO.:  [id number]   MEDICAL RECORD NO.:  [id number]          PATIENT TYPE:  EMS   LOCATION:  ED                           FACILITY:  [facility]   PHYSICIAN:  Michelene Gardener, MD    DATE OF BIRTH:  06-05-1945   DATE OF ADMISSION:  06/22/2005  DATE OF DISCHARGE:                                HISTORY & PHYSICAL   PRIMARY PHYSICIAN:  The patient is unassigned.   CHIEF COMPLAINT:  Redness, swelling and pain in her right leg and the right  side of her abdominal wall.   HISTORY OF PRESENT ILLNESS:  This is a 65 year old Caucasian female with  past medical history of multiple problems.  She presented with the above-  mentioned complaint.  This patient is morbidly patient well known to [facility]  She has been admitted many times because of her  cellulitis.  The last admission was this March 2007 because of exacerbation  of asthma.  This time she had been complaining of increasing back pain in  the lateral side of her right leg and right part of her abdominal wall.  This was associated with pain, redness and discharge.  She stated that she  had many admissions for the same complaint and normally this gets  exacerbation in the top of her psoriasis.   In the ER, she was laying supine.  She was given one dose of vancomycin and  her white count was found to be elevated.   PAST MEDICAL HISTORY:  1.  Significant for disabling morbid obesity.  2.  Depression.  3.  Psoriasis.  4.  Diabetes mellitus.  5.  Chronic pain.  6.  Antiphospholipid syndrome  7.  Recurrent episodes of burning cellulitis.  8.  Myasthenia gravis.  9.  Sjogren's syndrome.   CURRENT MEDICATIONS:  1.  Aspirin 325 mg once daily.  2.  Glyburide 5 mg twice daily.  3.  Prozac 20 mg once daily.  4.  Zantac 150 mg once daily.  5.  Lantus insulin 20 units once daily.  6.  Methadone 10 mg three times daily.  7.  Lasix 20 mg once daily.  8.  Albuterol p.r.n.  9.   Triamcinolone 0.1% cream to skin three times daily.  10. Urocit 1 tablet twice daily.   ALLERGIES:  TETRACYCLINE.   SOCIAL HISTORY:  The patient is confined to bed and is totally dependent on  her husband for care.  She denies alcohol drinking and denies recreational  drugs.   FAMILY HISTORY:  Significant for hypertension and obesity.   REVIEW OF SYSTEMS:  Positive for pain, swelling, discharge from the right  side of her leg and abdomen.  Other systems:  CONSTITUTIONAL:  There is no  fever, no fatigue, and no weakness.  EYES:  No blurred vision, no pain and  no redness.  ENT:  No tinnitus, no ear pain, no hearing loss, no nystagmus,  no sinus pain, no difficulty swallowing.  RESPIRATORY:  No cough, no wheeze,  no hemoptysis.  CARDIOVASCULAR:  No chest pain, no edema, no palpitations,  no syncope.  GI:  No nausea, no vomiting, no diarrhea, no hematemesis, no  melena, no constipation.  GU:  No dysuria, no hematuria, no frequency.  ENDOCRINE:  No polyuria.  No sweating, no heat or cold intolerance.  HEMATOLOGY:  No bruising and no bleeding.  NEUROLOGICAL:  No numbness, no  weakness, no dysarthria.  No tremor, no ataxia, no headache.  The rest of  the systems were reviewed and they were negative.   PHYSICAL EXAMINATION:  VITAL SIGNS:  Temperature 98.2, blood pressure 94/60,  heart rate 96, respiratory rate 20.  Pulse oximetry is 91% and improved.  GENERAL:  This is a morbidly obese Caucasian female who is laying down in  bed, not in acute distress at the present time.  HEENT:  Conjunctivae is normal.  Lids are normal without erythema.  Pupils  are equal, round, and reactive to light and accommodation.  There is no  ptosis.  Hearing is intact.  There is no ear discharge or infection.  No  nose discharge, infection, or bleeding.  Oral mucosa is moist and there is  no erythema.  NECK:  Supple.  No carotid bruits, no lymphadenopathy, no thyroid  enlargement or thyroid tenderness.   CARDIOVASCULAR:  Auscultation is hard because of the patient's morbid  obesity.  I can hear S1 and S2 with no additional heart sounds and no  murmurs, no gallops and no thrills.  RESPIRATORY:  The patient is breathing between 12 to 16.  No evidence of  hemoptysis.  No intercostal retractions.  No dullness.  No rales, no  rhonchi, no wheezes.  ABDOMEN:  Soft, nondistended.  No evidence of splenomegaly.  Bowel sounds  are normal.  EXTREMITIES:  Lower extremities showed psoriatic lesions in the right leg  which is superimposed by infection and it is tender to touch.  SKIN:  As mentioned, there is psoriatic lesions involving muscle to skin and  more evident in the right lower extremity and the right side of her abdomen.  It is associated with superimposed infection that is associated with  redness, tenderness, and some discharge.  PSYCHIATRIC:  The patient is alert and oriented x3.  She seems to be anxious  about her current condition.  She is over talkative and is cooperative with  physical examination.   LABORATORY DATA:  WBC 14.6, hemoglobin 14.0, hematocrit 42.5, MCV 82.6,  platelet count was 232,000.  Sodium 139, potassium 3.8, chloride 104, bicarb  29, glucose 161, BUN 9, creatinine 0.8, serum glucose 9.2.   IMPRESSION:  This is a morbidly obese lady with recurrent admissions of  cellulitis, presenting again with cellulitis.   ASSESSMENT/PLAN:  1.  Cellulitis involving the right lower extremity and the right side of the      abdominal wall:  This cellulitis seems to be very bad cellulitis.  I      will admit this patient to the regular medical floor.  I will start her      on ___________ 100 mg IV x1 and then we will continue her on 50 mg      q.12h.  I will get two sets of blood cultures and I will consider      infectious disease consultation for further evaluation.  2.  Asthma:  There is no acute attack at the present time.  We will continue     her Albuterol p.r.n.  3.   Diabetes mellitus:  We will continue her Lantus.  We will start her on  Regular insulin sliding scale and we will get hemoglobin A1c to assess      her control in the last three months.  4.  Myasthenia gravis:  There is no acute exacerbation at the present time.  5.  Psoriasis.   Total assessment time is 50 minutes.           ______________________________  Michelene Gardener, MD     NAE/MEDQ  D:  06/22/2005  T:  06/22/2005  Job:  [id number]

## 2010-07-20 NOTE — Discharge Summary (Signed)
[name], [name] NO.:  [id number]   MEDICAL RECORD NO.:  [id number]          PATIENT TYPE:  INP   LOCATION:  3017                         FACILITY:  [facility]   PHYSICIAN:  Sherin Quarry, MD      DATE OF BIRTH:  [birth year]   DATE OF ADMISSION:  07/05/2005  DATE OF DISCHARGE:  07/10/2005                                 DISCHARGE SUMMARY   [name] is an extremely obese 65 year old lady with a history of  chronic psoriasis who was readmitted on Jul 05, 2005, with increased pain on  the side of her abdomen as well as pain in her right leg.  It was felt that  perhaps she had evidence of cellulitis.  Physical exam at time of admission  as described by Dr. Jannette Spanner:  Temperature was 98.4.  the patient was  an obese lady who was lying in bed.  HEENT exam was within normal limits.  The chest was clear.  Cardiovascular exam showed normal S1 and S2.  There  are no rubs, murmurs or gallops.  The abdomen was extremely obese.  There  were multiple lesions of psoriasis along with some areas of erythema around  these lesions.  Psoriatic lesions were also seen in the right lateral thigh.  The white count was 14,400; hemoglobin was 14.3.  Sodium was 134, potassium  3.7, creatinine was 0.8, BUN was 9, glucose was 177.  On admission, Dr.  Arthor Captain initially placed the patient on vancomycin 1 g IV daily and Zosyn  3.375 g IV every 6 hours, as well as a sliding scale insulin regimen.  The  patient was continued on methadone 20 mg t.i.d. and was given morphine as  needed for pain.  I had a discussion with the patient in which I told her  that I do not think that she has evidence of cellulitis and that her main  problem is severe psoriasis aggravated by scratching.  I told her I did not  think that intravenous antibiotics were going to be helpful to her  management.  She was somewhat reluctant to accept this advice so I offered  to obtain an infectious disease consult.  This was  obtained from Dr. Cliffton Asters.  Dr. Orvan Falconer thought that the reason why the patient always got  better when she was in the hospital was not because of antibiotics but  because of improved hygiene and nursing care.  He did not think that the  patient had an active infection and recommended stopping antibiotics.  In  light of this finding the patient was discharged on May 8.   DISCHARGE DIAGNOSES:  1.  Severe generalized psoriasis.  2.  Diabetes.  3.  Asthma.  4.  Morbid obesity with bed-bound state.  5.  Anxiety.   DISCHARGE MEDICATIONS:  The only change that was made in the patient's  medications was to change her Lantus insulin to 35 units at bedtime.  Otherwise, she was advised to take all her medicines exactly as previously.  These consist of:  1.  Glyburide 10 mg b.i.d.  2.  Cozaar 50 mg daily.  3.  Pepcid 20 mg daily.  4.  Methadone 10 mg t.i.d.  5.  Lasix 20 mg daily.  6.  KCl 10 mEq daily.  7.  Triamcinolone cream to affected areas three times daily.  8.  Albuterol inhaler p.r.n.  9.  Aspirin 81 mg daily.   CONDITION AT TIME OF DISCHARGE:  Fair.           ______________________________  Sherin Quarry, MD     SY/MEDQ  D:  09/27/2005  T:  09/27/2005  Job:  [id number]

## 2010-07-20 NOTE — H&P (Signed)
NAMEMarland Kitchen  GARNETT, REKOWSKI                        ACCOUNT NO.:  [id number]   MEDICAL RECORD NO.:  [id number]                   PATIENT TYPE:  EMS   LOCATION:  MAJO                                 FACILITY:  [facility]   PHYSICIAN:  Hollice Espy, M.D.            DATE OF BIRTH:  April 08, 1945   DATE OF ADMISSION:  10/31/2003  DATE OF DISCHARGE:                                HISTORY & PHYSICAL   ATTENDING PHYSICIAN:  Deirdre Peer. Polite, M.D.   PRIMARY CARE PHYSICIAN:  Meredith Staggers, M.D.   CHIEF COMPLAINT:  Fall with a toe fracture.   HISTORY:  The patient is a 65 year old white female with a past medical  history of morbid obesity, myasthenia gravis and psoriasis, who presents  after a fall in her tub.  The patient was showering last night, when she  fell, twisting her foot.  She said she felt and heard a crunching in her  toe.  She was unable to stand up, secondary to the pain in her foot, as well  as her morbid obesity.  Her husband works shifts at night, and she was  unable to be tended to.  She laid in the tub for most of the night.  This  morning when she was discovered, an ambulance was called, and the patient  was brought into the emergency room.  The patient had x-rays done of the  leg, ankle and the left foot, which showed evidence of only a left second  metatarsal fracture.  No ankle fracture or other leg fracture was noted.  The patient was given some Dilaudid for pain, and otherwise she states she  is feeling okay.  Her leg is in a cast.  She has no other complaints.  She  denies any headaches or visual changes.  She denies any dysphagia or chest  pain or palpitations.  She has chronic psoriatic plaques on all four  extremities, as well as her groin area.  The patient denies any shortness of  breath, abdominal pain, hematuria, although she tells me she gets yeast  infections often.  She denies any constipation or diarrhea.   PAST MEDICAL HISTORY:  1. History of  myasthenia gravis.  2. Gastroesophageal reflux disease.  3. Psoriasis.  4. Urinary retention.  5. Medical non-compliance.  6. Morbid obesity.  7. History of Raynaud's phenomenon.  8. History of antiphospholipid syndrome.   MEDICATIONS:  1. The patient takes an aspirin daily.  2. Detrol LA 4 mg daily.  3. She was on Enbrel for her psoriasis, which she took herself off.  4. She is on Phentermine 37.5 mg daily for weight loss.  5. Prozac 20 mg daily.  6. Zantac 150 mg b.i.d.  7. Methadone for chronic back pain.   ALLERGIES:  TETRACYCLINE OR MUSCLE RELAXERS SECONDARY TO HER MYASTHENIA  GRAVIS.   SOCIAL HISTORY:  She quit smoking in the year 2000.  She occasionally drinks  alcohol, but not that often.  She denies any other drug use other than on  methadone, which was prescribed to her.   FAMILY HISTORY:  Noncontributory.   PHYSICAL EXAMINATION:  VITAL SIGNS:  On admission temperature 96.5 degrees,  heart rate 114, now down to 65, blood pressure 113/63, respirations 24, O2  saturation 90% on room air.  GENERAL:  The patient is alert and oriented x3, in no apparent distress.  HEENT:  Normocephalic, atraumatic.  She has a very narrow airway.  She is  quite morbidly obese.  HEART:  A regular rate and rhythm, although soft.  LUNGS:  Clear to auscultation bilaterally, although decreased breath sounds  throughout.  ABDOMEN:  Obese, nontender, with positive bowel sounds.  EXTREMITIES:  All four extremities are covered with psoriatic scaling  plaques.  Her left lower extremity is in a cast up to her knee, involving  the foot as well. She has 2+ pitting edema.   LABORATORY DATA:  Not done.   X-rays show a non-displaced fracture involving the base of the second  metatarsal with intra-articular extension.  No evidence of any subluxation.  The ankle shows some medial soft tissue swelling, but no evidence of any  ankle fracture or dislocation.  The tibia and fibula on the left side is   normal as well.   ASSESSMENT/PLAN:  1. Follow the fracture of the left second metatarsal and the patient's     inability to ambulate, secondary to morbid obesity as well, for 24-hour     observation.  A PT and rehabilitation consultation.  Lovenox for deep     vein thrombosis prophylaxis.  As the patient laid in her tub all night,     will go ahead and check a CPK to rule out any type of muscle breakdown or     rhabdomyolysis.  Will also check a BMET as well.  2. Chronic back pain:  She is on methadone.  Will put her on a Duragesic     patch.  3. History of myasthenia gravis:  She is on Prozac.  4. Gastroesophageal reflux disease:  She is on Zantac.  5. Psoriasis:  She was on Enbrel which she took herself off of.  6. Urinary retention:  She is on Detrol.  7. Medical non-compliance.  8. Tobacco abuse:  She quit in 2000.  9. Morbid obesity:  She is on a weight loss drug of Phentermine, which will     hold for now.  10.      History of Raynaud's which appears stable.  11.      History of antiphospholipid syndrome:  She is on an aspirin, which     we will hold, secondary to starting her on Lovenox.                                                Hollice Espy, M.D.    SKK/MEDQ  D:  10/31/2003  T:  10/31/2003  Job:  [id number]   cc:   Meredith Staggers, M.D.  [address], [address]  [place]  [place] [address]  Fax: [phone]

## 2010-07-20 NOTE — Discharge Summary (Signed)
NAMESHANTRELL, PLACZEK NO.:  [id number]   MEDICAL RECORD NO.:  [id number]          PATIENT TYPE:  INP   LOCATION:  5715                         FACILITY:  [facility]   PHYSICIAN:  Theone Stanley, MD   DATE OF BIRTH:  Apr 24, 1945   DATE OF ADMISSION:  01/23/2005  DATE OF DISCHARGE:  01/28/2005                                 DISCHARGE SUMMARY   ADMITTING DIAGNOSES:  1.  Cellulitis/panniculitis.  2.  Psoriasis.  3.  Diabetes.  4.  Chronic pain.  5.  Depression.  6.  Asthma.  7.  Antiphospholipid syndrome.  8.  Myasthenia gravis.  9.  Medical noncompliance.   DISCHARGE DIAGNOSES:  1.  Cellulitis/panniculitis.  2.  Psoriasis.  3.  Diabetes.  4.  Chronic pain.  5.  Depression.  6.  Asthma.  7.  Antiphospholipid syndrome.  8.  Myasthenia gravis.  9.  Medical noncompliance.   CONSULTS:  None.   PROCEDURES:  None.   HOSPITAL COURSE:  Mrs. Curtiss is a markedly obese 65 year old female with  multiple issues who presents with recurrent cellulitis and panniculitis.  Patient has noted over the past few days prior to admission she had  increasing erythema, swelling, pain of her panniculus with some mild  weeping.  She became concerned and came to the emergency room for  evaluation.  It was noted that her white count was continued with an 87%  neutrophils.  Rest of her laboratories were unremarkable.  Patient had  significant pain from this.  Patient was admitted, placed on IV Ancef.  By  the next ensuing days patient improved with her erythema and pain.  She was  switched to Augmentin; however, she seemed to have some GI distress from  this.  She was switched back to her Keflex which she normally takes.  Her  psoriasis improved dramatically on her triamcinolone and Nizoral.  Wound  Care was consulted during her stay and they had recommended Nystatin powder  instead of the cream, in addition to place ABD pads in the panniculus to  absorb drainage.  Overall,  patient's condition improved and her pain and  drainage improved such that she was stable enough to go home.   DISCHARGE MEDICATIONS:  1.  Keflex 500 mg one p.o. t.i.d. for a total of 14 days.  She had      additional nine days to go.  2.  She was given Nystatin powder prescription to apply underneath her skin      folds.  3.  She had requested some methadone until she was seen by Dr. Riley Kill, her      chronic pain physician.  4.  She was to continue her Diflucan 150 mg for additional two days.   HOME MEDICATIONS:  1.  Prozac 20 mg one p.o. daily.  2.  Effexor 75 mg one p.o. daily.  3.  Methadone 10 mg t.i.d.  4.  Glyburide 5 mg one p.o. b.i.d.  She is to resume this once her Diflucan      is done.  5.  Lantus 20 units subcutaneous q.h.s.  6.  Aspirin 81 mg one p.o. daily.  7.  Nizoral and triamcinolone cream.  8.  Phentermine 37.5 mg one daily.  9.  Zantac 75 mg daily.  10. Prosed/DS one p.o. b.i.d. p.r.n.   FOLLOW-UP:  Patient is to follow up with Dr. Lenise Arena with a CBC when she is  seen by Dr. Lenise Arena.      Theone Stanley, MD  Electronically Signed     AEJ/MEDQ  D:  01/28/2005  T:  01/28/2005  Job:  [id number]   cc:   Royetta Crochet, MD  Fax: [phone]

## 2010-07-20 NOTE — Consult Note (Signed)
NAMESHARYN, [name] NO.:  [id number]   MEDICAL RECORD NO.:  [id number]          PATIENT TYPE:  EMS   LOCATION:  ED                           FACILITY:  [facility]   PHYSICIAN:  Sigmund I. Patsi Sears, M.D.DATE OF BIRTH:  09/20/1945   DATE OF CONSULTATION:  11/27/2003  DATE OF DISCHARGE:  11/27/2003                                   CONSULTATION   SUBJECTIVE:  [name] is a 65 year old obese white female with myasthenia  gravis, with a broken left leg.  She had Foley catheter placed during her  hospitalization, and that catheter was left by Dr. Brunilda Payor because of the  patient's inability to get up and get to the bathroom.  However, the  patient's catheter stopped working tonight.  Phone call discussion with the  patient with regard to medications was obtained because the patient was  having bladder spasms.  She was cautioned not to use anticholinergic  medications because of her myasthenia gravis.  However, because the patient  felt that her catheter was not draining, she came to the emergency room.   PAST MEDICAL HISTORY:  1.  Significant for myasthenia gravis.  2.  Asthma.  3.  Psoriasis.   SOCIAL HISTORY:  Drug use none.  Alcohol use occasional only.  The patient's  weight is unknown but appears to be approximately 500 pounds.   MEDICATIONS:  Fentanyl patch, oxycodone.   ALLERGIES:  None known.   PRIMARY CARE PHYSICIAN:  Dr. Francesca Oman   PHYSICAL EXAMINATION:  GENERAL:  A massively obese white female in no acute  distress.  She has a splint on her left leg and Foley catheter placed ([address]  [place]).  VITAL SIGNS:  Temperature 97.0, blood pressure 106/62, pulse 91, respiratory  rate 18.  O2 saturation 94%.  NECK:  Negative to palpation.  BREASTS:  Massively obese.  ABDOMEN:  Massively obese.  CHEST:  Massively obese.  Breath sounds are distant.  CARDIAC:  S1, S2 normal.  GENITALIA:  Normal female external genitalia.  Foley catheter is identified  and  exchanged.  RECTAL:  Not indicated at this examination.  EXTREMITIES:  Massive obesity.  The patient has a left splint.  There is 1+  pedal edema bilaterally.  PSYCHOLOGIC:  Normal orientation to person, time, and place.  LYMPH NODES:  Nonpalpable.   IMPRESSION:  Pubovaginal sling nonfunctioning.   PLAN:  Exchange Foley catheter for patient.  We will culture urine and have  patient continue antibiotic.      SIT/MEDQ  D:  11/27/2003  T:  11/27/2003  Job:  [id number]   cc:   Lindaann Slough, M.D.  [address]  [place]  [place] [address]  Fax: [phone]

## 2010-07-20 NOTE — H&P (Signed)
[name], [name] NO.:  [id number]   MEDICAL RECORD NO.:  [id number]          PATIENT TYPE:  INP   LOCATION:  1825                         FACILITY:  [facility]   PHYSICIAN:  Sherin Quarry, MD      DATE OF BIRTH:  [birth year]   DATE OF ADMISSION:  08/29/2004  DATE OF DISCHARGE:                                HISTORY & PHYSICAL   PRIMARY CARE PHYSICIAN:  Dr. Joycelyn Rua.   CHIEF COMPLAINT:  Diarrhea.   HISTORY OF PRESENT ILLNESS:  The patient is a 65 year old white female who  has been on the [facility] Service recently for cellulitis of her  leg.  She has a previous history of morbid obesity, myasthenia gravis and  extensive psoriasis with treatment of recurrent cellulitis of the skin folds  and leg ulcers.  She was attempted treatment approximately 3 weeks ago and  at that time was started on p.o. amoxicillin which was tried for about 10  days with no avail, then she was on p.o. Levaquin for approximately 1 week  with no avail.  The patient then had a PICC line place and had IV Rocephin  started, which she tells me has not had an effect either, but then 4 days  ago, started developing diarrhea.  The diarrhea is described as profuse,  severe and non-relenting even with p.r.n. Imodium.  Symptoms continued for  the next 3 days and the patient could not take it any more, and came into  the emergency room.  I suspect that likely she has C. difficile, given her  extensive antibiotics she has been on the last few days.  She currently  tells me that she is unable to care for herself while she has this diarrhea  at home.  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  Other than that,  she has no complaints.   REVIEW OF SYSTEMS:  She denies any headache, visual changes, dysphagia,  chest pain, palpitations, shortness of breath, wheeze, cough, hematuria and  constipation.  She does complain of chronic pain, diarrhea, leg pain.  Review of systems is otherwise  negative.   PAST MEDICAL HISTORY:  The patient's past medical history includes:  1.  Morbid obesity.  2.  Psoriasis.  3.  Chronic pain with deconditioning.  4.  Diabetes mellitus.  5.  Antiphospholipid syndrome.  6.  Myasthenia gravis.  7.  Sjogren's syndrome.  8.  Depression.  9.  History of partial hysterectomy.  10. Asthma.  11. Cholecystectomy.  12. Appendectomy.  13. Pilonidal cyst surgery.   MEDICATIONS:  Medications based on previous history and physical, as the  patient is unable to give me a full list, she tells me that she has lost  some of her medicines, include:  1.  Lantus 20 units subcu nightly.  2.  Methadone 10 mg p.o. five times a day.  3.  Phentermine 37.5 mg p.o. daily.  4.  Colace 100 mg p.o. daily.  5.  Prevacid DS one to two tabs p.o. b.i.d. p.r.n.  6.  Ketoconazole cream 2% b.i.d. to affected areas.  7.  Effexor 75 mg p.o. daily.  8.  Aspirin 81 mg p.o. daily.  9.  Prozac 20 mg p.o. daily.  10. Xanax -- although unsure of the dose.  11. Dovonex 0.05% cream topically to affected areas b.i.d.  12. Triamcinolone cream topically to affected areas b.i.d., 0.1%.  13. Artificial tears one drop p.r.n.  14. Vicodin one to two p.o. p.r.n.  15. Saline nose drops p.r.n.   ALLERGIES:  The patient has allergies to TETRACYCLINE and MUSCLE RELAXANTS.   HABITS:  She quit smoking in 2001.  She occasionally drinks.  No drug use.   FAMILY HISTORY:  Noncontributory, although the patient was adopted.   PHYSICAL EXAM:  VITALS ON ADMISSION:  Temperature 99.4, blood pressure  113/63, heart rate 90, respirations 24, O2 SAT 92% on room air.  GENERAL:  The patient is alert and oriented x3, in no apparent distress.  HEENT:  Normocephalic and atraumatic.  Mucous membranes are moist.  She has  a narrow airway.  HEART:  Regular rate and rhythm, S1 and S2.  LUNGS:  Decreased breath sounds secondary to her body habitus.  ABDOMEN:  Abdomen is soft, obese, nontender.  Positive  bowel sounds.  EXTREMITIES:  She has diffuse psoriasis plaques on the extensor surfaces of  her body.  Her lower extremities show evidence of cellulitis, especially on  the right lower extremity, although it is somewhat difficult to discern when  intermixed with her psoriasis plaques as well.  She has extensive flaking of  these plaques.  She has 2+ pitting edema with moderate 1+ palpable pulses.   LABORATORY WORK:  White count of 14.8 with a 79% shift, H&H 13.1 and 39.7,  MCV of 82, platelet count 234,000.  Sodium is 138, potassium 3.8, chloride  104, bicarb 28, BUN 10, creatinine 0.9, glucose of 106.   ASSESSMENT AND PLAN:  1.  Diarrhea, suspected Clostridium difficile.  Cultures have been sent.  We      will start the patient on oral Flagyl and put her in isolation.  2.  Recurrent cellulitis.  We will start the patient on intravenous Zosyn.      She already has a peripherally inserted central catheter line placed as      an outpatient.  3.  Diabetes mellitus.  Continue her Lantus as well as sliding scale.  She      likely will need prescriptions for new doses of Lantus.  4.  Severe psoriasis.  Continue topical creams.  5.  Depression.  Continue antidepressants.  6.  Morbid obesity.  The patient will need a bariatric bed.  7.  Chronic pain issues.  Continue methadone.  For now, while she is here,      give her intravenous morphine for pain until she can get into a better      bed and more comfortable.  The patient will likely need to stay in the      hospital for the next 1-2 days until her diarrhea symptoms have      improved, then she can return home.       SKK/MEDQ  D:  08/29/2004  T:  08/29/2004  Job:  [id number]   cc:   Joycelyn Rua, M.D.  [address] [place]  [place] [address]  Fax: [phone]

## 2010-07-20 NOTE — H&P (Signed)
[name], [name] NO.:  [id number]   MEDICAL RECORD NO.:  [id number]          PATIENT TYPE:  INP   LOCATION:  1823                         FACILITY:  [facility]   PHYSICIAN:  Lonia Blood, M.D.       DATE OF BIRTH:  13-Aug-1945   DATE OF ADMISSION:  09/04/2005  DATE OF DISCHARGE:                                HISTORY & PHYSICAL   PRIMARY CARE PHYSICIAN:  Dr. Leonard Downing.   CHIEF COMPLAINT:  Right-sided abdominal wall increased discharge and pain.   HISTORY OF PRESENT ILLNESS:  [name] is a 65 year old woman with a  history of severe psoriasis, as well as recurrent abdominal wall cellulitis,  who presents to the [facility] emergency room with increased erythema and  drainage of the right side of her abdominal wall.  The patient has had  recurrent admissions for the same problem and reports that she had also had  some fever and chills recently.  She did not take any antibiotics recently.   PAST MEDICAL HISTORY:  1.  Psoriasis.  2.  Sjogren's syndrome.  3.  Antiphospholipid antibody syndrome.  4.  Morbid obesity.  5.  Mild asthma.  6.  Depression.  7.  Psoriasis.  8.  Recurrent cellulitis of the abdominal wall.  9.  Diabetes mellitus.   SOCIAL HISTORY:  The patient is married and lives at home with her husband.  She is essentially bed-bound.  She does not drink alcohol.  Does not smoke  cigarettes.  The patient has daughter and a grandson.   FAMILY HISTORY:  Noncontributory.   REVIEW OF SYSTEMS:  Positive for some mild headaches.  Negative for chest  pain.  Negative for cough.  Negative for abdominal pain.  Negative for  nausea, vomiting.  Negative for diarrhea.  Other systems as per HPI.  All  other systems are negative.   PHYSICAL EXAMINATION:  VITAL SIGNS:  On admission, temperature  98.8, blood  pressure 127/77, pulse 103, respirations 20s, saturation 96% on room air.  GENERAL APPEARANCE.  A morbidly obese woman in no acute distress, alert,  oriented, lying in bed.  Able to provide a full history.  HEENT:  Head is normocephalic, atraumatic.  Eyes are dry.  There is  increased erythema around the eyelids.  Mouth is with dry mucosa.  Extraocular movements intact.  NECK:  Supple without JVD.  Throat is clear.  CHEST:  Clear to auscultation bilaterally.  HEART:  Regular rate and rhythm without murmurs, rubs or gallops.  ABDOMEN:  Obese, nontender, nondistended.  Bowel sounds are present.  On the  right side of the abdominal wall, there is increased erythema, tenderness  and weeping discharge.  SKIN:  There are extensive areas of plaque formation consistent with  psoriasis with a poorly translucent appearance that extends on both lower  extremities, both upper extremities, on the abdominal wall and on the  breasts.  NEUROLOGIC:  The patient is able to move all 4 extremities, but she is  severely deconditioned.  The muscle mass is significantly decreased.   LABORATORY VALUES:  Pending at time of  admission.   ASSESSMENT/PLAN:  1.  Cellulitis of the right abdominal wall, most likely pathogens or skin      flora with strep and staph being the uttermost pathogens.  The plan is      to treat this patient with intravenous vancomycin and assess the      response on a daily basis.  2.  Candida intertrigo.  Will treat the patient with intravenous Diflucan      and local nystatin.  3.  Severe psoriasis.  The patient does see Dr. Terri Piedra for this problem.      Will try to get him in consultation.  I would also start the patient on      systemic prednisone, due to this fact that her psoriasis appears to be      at this point in time widespread.  4.  Diabetes mellitus.  Will start the patient on Lantus and sliding scale      insulin.  Adjust as needed.  5.  Depression.  Will continue with Prozac.  6.  Antiphospholipid antibody syndrome.  Will continue with aspirin for now      and use cautious deep vein thrombosis prophylaxis.       Lonia Blood, M.D.  Electronically Signed     SL/MEDQ  D:  09/04/2005  T:  09/04/2005  Job:  [id number]   cc:   Olene Craven, M.D.  Fax: [phone]

## 2010-07-20 NOTE — H&P (Signed)
[name], [name] NO.:  [id number]   MEDICAL RECORD NO.:  [id number]          PATIENT TYPE:  EMS   LOCATION:  MAJO                         FACILITY:  [facility]   PHYSICIAN:  Melissa L. Ladona Ridgel, MD  DATE OF BIRTH:  10-16-1945   DATE OF ADMISSION:  10/13/2004  DATE OF DISCHARGE:                                HISTORY & PHYSICAL   CHIEF COMPLAINT:  Pain and swelling of the right thigh and buttock.   PRIMARY CARE PHYSICIAN:  Dr. Silvestre Moment.   HISTORY OF PRESENT ILLNESS:  The patient is a 65 year old white female with  a past medical history for obesity, psoriasis with recurrent fungal and  bacterial cellulitis.  The patient has underlying autoimmune diseases which  contribute to this as well as poor medical compliance.  The patient today  presents for worsening skin breakdown and oozing of the right side of her  panus and thigh as well as fevers.  She denies any nausea, vomiting, chills,  but has stated felt she was hot and having fever.  She states areas of her  right thigh have been oozing significantly and drenched the bed overnight.   REVIEW OF SYSTEMS:  She has a 22-pound weight loss intentionally on  phentermine.  She has a headache right now and no nausea, no vomiting, no  diarrhea.  All other review of systems are negative.   PAST MEDICAL HISTORY:  1.  Antiphospholipid syndrome on aspirin.  2. Myasthenia gravis.  3.      Sjogren's syndrome.  4. Asthma.  5. Diabetes.  6. Chronic pain.  7.      Psoriasis.  8. Depression.   PAST SURGICAL HISTORY:  1.  Patrial hysterectomy.  2. Cholecystectomy.  3. Appendectomy.  4.      Pilonidal cyst repair.   SOCIAL HISTORY:  She quit tobacco in 2001.  She occasionally has ethanol.  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  She is married and in a  questionably functional relationship.   FAMILY HISTORY:  Unknown.  She is adopted.   ALLERGIES:  Tetracycline and muscle relaxants, anything that may exacerbate  her  myasthenia.   MEDICATIONS:  1.  She uses distilled water or acetic acid to flush her Foley which is a      chronic indwelling Foley.  2. Effexor XR 75 mg daily.  3. Glyburide 5 mg      p.o. b.i.d.  4. Prosaid 1-2 tabs p.o. b.i.d. for spasms in her bladder.      5. Zantac 75 mg daily.  6. Imodium p.r.n.  7. Flagyl she has leftover      from previous hospitalization.  8. Vicodin 5/500, 1-2 tabs q.4-6h.      p.r.n.  9. Methadone 10 mg, 1 p.o. t.i.d.  10. Phentermine 3.75, 1 p.o.      daily.  11. Fluoxetine 20 mg p.o. daily.  12. Fluconazole 100 mg daily x      two weeks.  This is an old prescription.  13. She uses an albuterol MDI.      14. Lantus 20 units  subcutaneous daily.  15. Nasal saline.  16. She has      some old Fentanyl patches but she does not use them.   PHYSICAL EXAMINATION:  VITAL SIGNS: temperature 98.9, blood pressure 97/64,  pulse 98, respirations 12, saturations 96% on room air.  GENERAL:  This is a pleasant white female in no acute distress.  She is  normocephalic, atraumatic.  Pupils equal, round, reactive to light.  Extraocular muscles are intact.  Mucus membranes are dry.  Cheeks are  flushed with telangiectasias.  NECK:  Supple.  There is no JVD, no lymph nodes and no carotid bruits.  CHEST:  Decreased breath sounds in all areas secondary to her body habitus.  No rhonchi, rales or wheezes are noted.  CARDIOVASCULAR:  Regular rate and rhythm, positive S1, S2, no S3, S4.  She  has distant heart sounds.  No murmurs, rubs or gallops are noted.  ABDOMEN:  Obese.  Soft, nontender, nondistended.  BREASTS:  Obese.  She had an area of psoriasis on the right breast nipple  and underneath her breasts she has areas of obvious fungal infection.  Her  pannus is enlarged and swollen with some areas of weeping erythema,  especially underneath the pannus which on the right side versus the left has  greater yeast involvement with denuded skin and irritation.  BUTTOCKS:  Appears the  same macerated, red with denuded skin secondary to  yeast infection.  EXTREMITIES:  Varying degrees of psoriatic plaque, some are bright red with  scale, others are pale salmon with scale.  The area of the right upper thigh  appears to be superinfected as there is cellulitis in the surrounding  tissues that are not involved with plaque.  NEUROLOGIC:  She is nonfocal, awake, alert, oriented.  Cranial nerves II-XII  are intact.  Power is 5/5.  Her motor function is only limited by her body  habitus.   LABORATORY DATA:  White count is 14.7, hemoglobin 13.5, hematocrit 40.9,  platelets 220.  Sodium 136, potassium 3.9, chloride 104, CO2 29, BUN 11,  creatinine 0.7, glucose 158.   ASSESSMENT/PLAN:  This is a 65 year old white female with severe psoriasis  being admitted for recurrent fungal and likely bacterial superinfection.  1.  Psoriasis.  Exacerbation with superinfection.  Will apply triamcinolone      to the psoriatic plaques on her extremities but not use this under the      pannus.  This will be b.i.d.  I will hold off on using Dovonex at this      time.  Ketoconazole 2% cream b.i.d. under the pannus b.i.d.  We use      Miloplex bandages if she starts to develop weeping from the wounds as      these will be absorptive and nonadhesive.  I would like to start and      continue oral Diflucan.  2.  Chronic pain.  Will continue her Vicodin and methadone.  3.  Asthma.  Will continue her MDI.  4.  Antiphospholipid syndrome.  Will continue her aspirin 81 mg.  5.  Diabetes.  Will continue her Lantus 20 units subcutaneous and her      Glyburide 5 mg daily as well as sliding scale insulin.  6.  GU.  She has a chronic indwelling Foley.  Will change her bag and tubing      as it appears to be majorly blocked.  We used acetic acid 0.5% solution      to flush  the catheter daily and p.r.n. as well as sterile water at least     b.i.d.  I will provide her with lidocaine topical for the perineal area       p.r.n. for catheter pain.  We will continue her Prosaid or the      equivalent formulary antispasmodic.  7.  Psychiatric.  Will continue her Effexor and fluoxetine.  8.  P.r.n. nasal saline for allergies.  9.  Myasthenia gravis is in remission.      Melissa L. Ladona Ridgel, MD  Electronically Signed     MLT/MEDQ  D:  10/13/2004  T:  10/13/2004  Job:  [id number]   cc:   Joycelyn Rua, M.D.  [address] [place]  [place] [address]  Fax: [phone]

## 2010-07-20 NOTE — Op Note (Signed)
[place]  Patient:    [name], [name] Visit Number: [id number] MRN: [id number]          Service Type: DSU Location: [id number] Attending Physician:  Retia Passe Dictated by:   Vania Rea. Warren Danes, D.D.S. Proc. Date: 12/02/00 Admit Date:  12/02/2000 Discharge Date: 12/03/2000                             Operative Report  PREOPERATIVE DIAGNOSIS:  Maxillary and mandibular nonrestorable teeth #2, 3, 4, 5, 6, 7, 8, 9, 10, 11, 12, 13, 14, 19, 20, 21, 22, 23, 24, 25, 26, 27, 28, 29, and 32.  POSTOPERATIVE DIAGNOSIS:  Maxillary and mandibular nonrestorable teeth #2, 3, 4, 5, 6, 7, 8, 9, 10, 11, 12, 13, 14, 19, 20, 21, 22, 23, 24, 25, 26, 27, 28, 29, and 32.  PROCEDURE:  Removal of the above teeth.  SURGEON:  Vania Rea. Warren Danes, D.D.S.  FIRST ASSISTANT:  Tinnie Gens.  ANESTHESIA:  MAC.  ESTIMATED BLOOD LOSS:  Less than 100 cc.  FLUID REPLACEMENT:  Approximately 1300 cc crystalloid solution.  COMPLICATIONS:  None apparent.  INDICATION FOR PROCEDURE:  [name] is a 65 year old female who was referred to my office for removal of her remaining dentition.  The patient presented with multiple medical complicating factors, including hypertension, Sjogrens syndrome, antiphospholipid syndrome, and chronic pain medication use and abuse.  Due to her medical history, it was recommended that the teeth be removed under operating room conditions.  DESCRIPTION OF PROCEDURE:  On December 02, 2000, [name] was taken to the [facility] major operating suite, where she was placed on the operating room table in a supine position.  Following successful MAC anesthesia, the patients face, mouth, and oral cavity were prepped and draped in the usual sterile operating room fashion.  Attention was then directed intraorally, where a 4 x 4 gauze was placed as a throat screen.  Approximately 14 cc of 0.5% Xylocaine containing 1:200,000 epinephrine were  infiltrated in the maxillary palatal and buccal soft tissues in the right and left inferior alveolar nerve distribution.  Attention was then directed toward the maxillary arch, where a full-thickness mucoperiosteal flap was elevated around the buccal and palatal areas of the teeth using a #9 Molt periosteal elevator. The maxillary teeth were then subluxated from the alveolus using 11A elevator and removed from the oral cavity using a 150 dental forceps.  Alveoloplasties were then performed using a Stryker rotary osteotome and a #8 round bur.  In a similar fashion, the mandibular dentition was removed as well.  The surgical sites were irrigated with sterile saline irrigation solution and suctioned. The mucoperiosteal margins were then approximated and sutured in a continuous interlocking fashion using 4-0 chromic suture material on an FS2 needle.  The throat screen was removed.  [name] was then allowed to awaken from the anesthesia and taken to the recovery room, where she tolerated the procedure well and without apparent complication. Dictated by:   Vania Rea. Warren Danes, D.D.S. Attending Physician:  Retia Passe DD:  12/03/00 TD:  12/04/00 Job: [id number]

## 2010-07-20 NOTE — Discharge Summary (Signed)
[name], [name]              ACCOUNT NO.:  [id number]   MEDICAL RECORD NO.:  [id number]          PATIENT TYPE:  INP   LOCATION:  6727                         FACILITY:  [facility]   PHYSICIAN:  Kela Millin, M.D.DATE OF BIRTH:  1946/03/04   DATE OF ADMISSION:  08/29/2004  DATE OF DISCHARGE:  09/08/2004                                 DISCHARGE SUMMARY   DISCHARGE DIAGNOSES:  1.  Recurrent cellulitis, lower extremities.  2.  Psoriasis.  3.  Fungal dermatitis.  4.  Diarrhea, presumed Clostridium difficile.  5.  Diabetes mellitus.  6.  Chronic pain with deconditioning.  7.  Morbid obesity.  8.  History of antiphospholipid syndrome.  9.  History of myasthenia gravis.  10. History of Sjogren's syndrome.  11. History of asthma.   HISTORY:  The patient is a 65 year old white female with a history of  recurrent cellulitis of her lower extremities/superinfection of psoriasis  plaques in both lower extremities, who had been on outpatient IV antibiotics  for cellulitis as well and presented with complaints of diarrhea.  The  patient's other past medical history is significant for morbid obesity,  myasthenia gravis, extensive psoriasis, chronic pain with deconditioning,  and diabetes mellitus, and it was noted that she had last been hospitalized  in March 2006 for inpatient treatment of cellulitis as well.  She reported  that __________ on amoxicillin p.o., which she received for 10 days with no  improvement of her cellulitis, and then she was put on Levaquin for about a  week, still with no improvement of the cellulitis.  Following this the  patient had a PICC line placed and was started on IV Rocephin, which she had  received four about four days prior to her admission, and it was following  that that she developed the diarrhea.  She reported that she was having  profuse and unrelenting diarrhea even with Imodium p.r.n.  Her symptoms  continued for about three days prior to her  admission, and she came to the  emergency room.  The patient stated that she was unable to take care of  herself while having that much diarrhea at home and that she lived with her  husband and a home health aide was also assisting them.  The patient was  admitted to the [facility] service for further evaluation and  management of suspected C. difficile diarrhea.   PHYSICAL EXAMINATION:  VITAL SIGNS:  Upon admission, her physical exam as  per Dr. Tresa Endo revealed a temperature of 99.4 with a blood pressure of  113/63, a pulse of 90, a respiratory rate of 20, O2 saturation of 92%,  percent, and the pertinent findings on exam:  GENERAL:  She was morbidly obese.  ABDOMEN:  Obese, soft, nontender.  Bowel sounds were present.  EXTREMITIES:  On her extremities, she had diffuse psoriatic plaques on the  extensor surfaces of her body, and her lower extremity had findings  consistent with cellulitis, especially of the right lower extremity, and  this was intermixed with her psoriatic plaques.  The patient also had +2  pitting edema with moderate +  1 palpable pulses.   LABORATORY DATA:  White cell count was 14.8 with a neutrophil count of 79%.  Her hemoglobin was 13.1 with a hematocrit of 39.7%, a platelet count 234.  Sodium 138, potassium 3.8, chloride 104, bicarb 28, BUN 10, creatinine 0.9,  glucose of 106.   HOSPITAL COURSE:  Problem 1. RECURRENT CELLULITIS:  Upon admission, the  patient was started on IV Zosyn.  She remained afebrile with an improvement  of her leukocytosis during her hospital stay.  Since she had already failed  outpatient antibiotics, it was deemed necessary for the patient to complete  a 10-day course of Zosyn in the hospital.  The patient's skin lesions  improved significantly, with almost a complete resolution of the edema.  The  erythema and psoriatic plaques also decreased significantly.  The patient has completed her antibiotic course at this time.  She is   afebrile, she is tolerating p.o. well, and she will be discharged to follow  up with her primary care physician.   Problem 2. PSORIASIS, SEVERE:  The patient was maintained on her Dovonex as  well as triamcinolone creams while in the hospital with a marked improvement  in the psoriatic lesions.   Problem 3. FUNGAL DERMATITIS:  The patient was put on ketoconazole as well  as oral Diflucan, her skin has improved significantly, and she will continue  the oral Diflucan upon discharge, and she is to follow up with her primary  care physician.   Problem 4. DIARRHEA:  The patient was empirically started on oral Flagyl  after attempting to collect her stools for a few days unsuccessfully.  The  stool studies were finally done and the C. difficile was negative x2.  The  patient was maintained on her Flagyl and is to continue taking it upon  discharge to complete a 14-day course.  Her diarrhea has improved  significantly.  She is having on average about two to three stools per day,  and they are more formed at this time.  The patient is also on Questran.  She is to follow up with her primary care physician.   Problem 5. DIABETES MELLITUS:  The patient was maintained on Lantus during  hospital stay.  Her Accu-Checks were monitored and she was also covered with  sliding scale insulin.   Problem 6. DEPRESSION:  The patient was continued on Prozac during her  hospital stay.   Problem 7. CHRONIC PAIN:  The patient was maintained on her methadone, and  this will be continued on discharge.   DISCHARGE MEDICATIONS:  1.  Flagyl 500 mg one p.o. t.i.d. x10 days.  2.  Diflucan 100 mg one p.o. q.d. x14 days.  3.  Lantus 20 units subcu q.h.s.  4.  Methadone 10 mg one p.o. five times per day.  5.  Phentermine 37.5 mg one p.o. q.d.  6.  Questran 4 g two times per day for diarrhea.  7.  Imodium 2 g one p.o. p.r.n. after loose stools for a maximum of four     tablets per day.  8.  Pepcid 20 mg two times  per day.  9.  Ketoconazole 2% cream applied two times per day to affected areas.  10. Nystatin powder, apply to affected areas three times per day.  11. Dovonex 0.05% cream, apply to affected areas b.i.d.  12. Triamcinolone cream 0.1% cream, apply to affected areas b.i.d.  13. The patient to continue aspirin, Vicodin, Prozac, Xanax, as well as  Artificial Tears and nasal saline spray.   DISCHARGE CONDITION:  Improved/stable.   FOLLOW-UP CARE:  1.  Patient to follow up with primary care physician, Dr. Joycelyn Rua, in      one week.  2.  Home health RN for chronic for Foley catheter as well as dressing      changes to right upper thigh area with Mepilex ordered dressing changes.       ACV/MEDQ  D:  09/08/2004  T:  09/08/2004  Job:  [id number]   cc:   Joycelyn Rua, M.D.  [address] [place]  [place] [address]  Fax: [phone]

## 2010-07-20 NOTE — H&P (Signed)
[name], CHESTNUTT NO.:  [id number]   MEDICAL RECORD NO.:  [id number]          PATIENT TYPE:  INP   LOCATION:  1826                         FACILITY:  [facility]   PHYSICIAN:  Hollice Espy, M.D.DATE OF BIRTH:  [birth year]   DATE OF ADMISSION:  01/23/2005  DATE OF DISCHARGE:                                HISTORY & PHYSICAL   PRIMARY CARE PHYSICIAN:  Joycelyn Rua, M.D.   CHIEF COMPLAINT:  Infection of the abdomen and leg.   HISTORY OF PRESENT ILLNESS:  The patient is a 65 year old white female with  a past medical history of psoriasis, morbid obesity, antiphospholipid  syndrome, and diabetes, who has recurrent episodes of panniculitis and  recurrent cellulitis of the legs.  Her social situation is that she lives at  home and is cared for by her husband and manages relatively well, although  she declined attempts at putting her a higher care level facility.  She had  noted that over the last few days that she started having increasing  erythema, swelling, and pain of her fundus, as well as underneath the folds  of her skin and abdomen and legs, as well as having some mild weeping.  She  was concerned and came into the emergency room for further evaluation.  The  labs checked noted a white count of 15.3 with an 87% shift.  The rest of her  laboratories were unremarkable.  The patient is having significant pain from  this, and on examination she lifted up her folds showing areas of erythema,  as well as what appears to be yeast-like areas.  In addition, she also is  complaining that her psoriasis has been flaring up.  She was previously in  the hospital about 3 months ago for a psoriasis infection.  She has a  history of non-compliance with medication that leads to increased psoriatic  plaques that become superinfected with fungus and bacteria.  Currently, she  denies any headaches, vision changes, or dysphagia.  No chest pain,  palpitations, shortness of  breath, wheeze, cough.  She does complain of some  abdominal pain at the areas of cellulitis infection.  She denies any  hematuria or dysuria.  She has a Foley catheter in.   REVIEW OF SYSTEMS:  Otherwise negative.   PAST MEDICAL HISTORY:  1.  Depression.  2.  Obesity.  3.  Medical noncompliance.  4.  Psoriasis.  5.  Diabetes.  6.  Chronic pain.  7.  Asthma, at baseline.  8.  Antiphospholipid syndrome.  9.  Recurrent episodes of cellulitis and panniculitis.   MEDICATIONS:  1.  The patient is on Prozac 20 daily.  2.  Effexor 75 p.o. daily.  3.  Phentermine 37.5 p.o. daily.  4.  Methadone 10 p.o. t.i.d.  5.  Glyburide 5 p.o. b.i.d.  6.  Lantus 20 p.o. nightly.  7.  Aspirin 81 p.o. daily.  8.  Zantac 75 p.o. daily.  9.  Nizoral cream applied to the abdomen b.i.d.  10. Triamcinolone cream applied topically b.i.d.   ALLERGIES:  1.  TETRACYCLINE.  2.  MUSCLE RELAXERS.  FAMILY HISTORY:  Noncontributory.   PHYSICAL EXAMINATION:  VITAL SIGNS:  The patient's vitals on admission  revealed a temperature of 97.1, heart rate 106, blood pressure 124/72,  respirations 24, O2 saturation 94% on room air.  GENERAL:  The patient is alert and oriented x3, in no apparent distress.  HEENT:  Normocephalic and atraumatic.  She is morbidly obese.  She has a  very narrow airway.  HEART:  Regular rate and rhythm.  S1 and S2.  LUNGS:  Decreased breath sounds throughout secondary to body habitus.  ABDOMEN:  Obese, distended.  She has evidence of erythema and psoriatic  plaques across her abdomen underneath the skin folds, going down both of her  legs, most extensively on the anterior tibial portions of her legs with  raised plaques, looking both cellulitis and psoriatic.  There is evidence of  some scraped plaques that are bleeding on the superficial anterior tibias.  These extend down to her buttocks, as well, from what I can tell.   LABORATORY DATA:  White count 15.3, hemoglobin and hematocrit  13.3 and 39.9.  MCV 81.  Platelet count 248.  There was an 87% shift.  Blood cultures drawn  and pending.  Sodium 136, potassium 2.9, chloride 103, bicarbonate 31, BUN  11, creatinine 0.7, glucose 150.   ASSESSMENT AND PLAN:  1.  Cellulitis/panniculitis.  I will start the patient on IV Ancef.  2.  Psoriasis.  Continue triamcinolone cream on affected areas twice a day.  3.  Diabetes.  Continue glyburide and Lantus.  4.  Chronic pain.  Continue methadone.  5.  Depression.  Continue Prozac and Effexor.  6.  Will also ask for a wound care consult and follow the patient's labs.      Once her white count has normalized, continue medications and discharge      her home.      Hollice Espy, M.D.  Electronically Signed     SKK/MEDQ  D:  01/23/2005  T:  01/23/2005  Job:  [id number]   cc:   Joycelyn Rua, M.D.  Fax: [phone]

## 2010-07-20 NOTE — Discharge Summary (Signed)
NAMECARELI, LUZADER              ACCOUNT NO.:  [id number]   MEDICAL RECORD NO.:  [id number]          PATIENT TYPE:  INP   LOCATION:  0482                         FACILITY:  [facility]   PHYSICIAN:  Melissa L. Ladona Ridgel, MD  DATE OF BIRTH:  07/17/1945   DATE OF ADMISSION:  05/14/2004  DATE OF DISCHARGE:                                 DISCHARGE SUMMARY   Date of discharge is yet to be determined.   DISCHARGING DIAGNOSES:  1.  Decompensated psoriasis with superinfection, likely bacterial as well as      fungal.  The patient has responded favorably to oral antifungal agents      as well as antibiotics in addition to topical psoriatic treatments.  Her      psoriatic plaques have decreased in thickness significantly, and there      is evidence for a new skin growth in all areas.  Her left upper thigh,      which appeared edematous and cellulitic has improved significantly to      the point where she actually has folds in the skin of her knee, and the      plaque on her leg is no longer as thick and irritated.  She will follow      up after discharge with her dermatologist, Dr. Terri Piedra.  She should make      an appointment with Dr. Terri Piedra in 2 weeks following discharge.  2.  Chronic pain with deconditioning.  The patient has been seen by Dr.      Faith Rogue as an outpatient and, at this time, she has been      maintained on methadone 10 mg t.i.d., Vicodin 5/500, 1-2 tabs q.6h.      p.r.n. for pain.  At rest, this appears to maintain her chronic back      pain and lower leg pain.  She still complains of pain with activity, and      therefore, a rehabilitation stay at some point might be necessary in      order to titrate her pain medications to her level of activity.  3.  Depression.  The patient has been maintained on Effexor 37.5 mg daily as      well as fluoxetine 20 mg daily.  4.  Diabetes.  The patient has a hemoglobin A1c of 9.9 with poor dietary      habits and poor insight into the  use of diet for her diabetes.  She is      heavily guided by likes and dislikes of food and the limitations of her      obesity, as she does not have a stable caregiver.  Her husband attempts      to help but is not always able to do the best job.  At this time, the      patient is on Lantus 15 units which we plan to titrate to effect and may      discharge to home on an oral hypoglycemic depending on how these would      affect her care.  We will leave an  addendum to this discharge dictation      with her final medication listing.  The patient will be referred for an      outpatient diabetic management session which should include her spouse.  5.  Antiphospholipid syndrome.  The patient is maintained on aspirin 81 mg      daily for this.  6.  Myasthenia gravis.  At this time, she is in remission and does not      require any treatment.  7.  Chronic lower extremity swelling and volume overload.  I have elected to      start this patient on hydrochlorothiazide 25 mg daily which has had a      good effect.  Her renal function is continuing to tolerate this, and her      potassium has been stable.  I will recommend that she be discharged to      home on this level; however, my partner will reevaluate her prior to      discharge.  8.  Diarrhea.  This may well be antibiotic associated diarrhea; however, at      this time, we are continuing to evaluate her stools for C. difficile as      she has been on clindamycin and quinolone.  At this time, she has 2 more      days of moxifloxacin left, and I will elect to start her on Questran      until the second C. difficile culture returns.  If that second C.      difficile is negative, it may be safe to start her on some Imodium but,      at this time, I will elect to use Questran.  The patient will be unable      to take care of herself at home, as she has diarrhea, and therefore, our      goal for discharge will be to have formed stools.  9.  Morbid  obesity.  The patient continues to take phentermine.  We have      continued this in the hospital, and she should follow up with her      primary care physician regarding this.   CURRENT MEDICATIONS:  1.  Effexor XR 37.5 mg daily.  2.  Aspirin 81 mg daily.  3.  Prozac 20 mg daily.  4.  She can resume Zantac as directed.  5.  In the hospital, she has been maintained on Protonix 40 mg daily.  6.  Diflucan 200 mg p.o. q.24h.  She is day 6 at this time and likely should      have a full 14 day course of Diflucan.  7.  She uses Xopenex 0.05% cream b.i.d. which should be continued at home.      Dr. Terri Piedra recommends Triamcinolone 0.1% cream b.i.d. to all affected      areas except for her pannus.  She will need a 1 pound jar to take home      with her.  8.  Ketoconazole 2% cream b.i.d. to the areas under her pannus.  This should      be applied prior to any powder application, and she can go home with a      60 g tube on discharge.  The patient should follow up with Dr. Terri Piedra      regarding further therapy in approximately 2 weeks after discharge.  9.  Lantus which at this time is 15 units subcu q.h.s.  This may  be up-      titrated prior to her discharge depending on her blood sugars, so please      see addendum dictations with her updated medication list.  10. Methadone 10 mg p.o. t.i.d.  11. Phentermine 37.5 mg p.o. daily.  12. Hydrochlorothiazide 25 mg daily.  13. Colace 100 mg daily.  We will be holding this for diarrhea.  14. Cipro 500 mg p.o. q.8h. x 2 more days.  15. Ambien 5 mg p.o. q.h.s. p.r.n.  16. Uracid tabs 2-4 tabs p.o. b.i.d. p.r.n. for bladder spasms.  The patient      can resume her Prosed/DS 1-2 tabs which replaces this.  17. Lidocaine 5% ointment p.r.n. to the perineum for pain related to her      catheter.  18. Vicodin 1-2 tabs p.o. q.6h. p.r.n. for pain.  19. 0.5% acetic acid solution for flushing her catheter once daily and      p.r.n.  20. Sterile water for  irrigation p.r.n. 21. Artificial Tears 1 drop p.r.n.   Please see dictated addendum for the updated medication list at the time of  the patient's discharge.   HISTORY OF PRESENT ILLNESS:  The patient is a 65 year old Caucasian female,  who presented to the emergency room with increasing leg swelling, pain, and  redness at her left thigh.  She was found to have worsening redness and pain  surrounding a psoriatic plaque which was felt to be an infected area with  cellulitis.  She was therefore admitted to the hospital for further  antibiotic treatment and treatment of her decompensated psoriasis.  The  patient was admitted to the general medical floor.  She was seen by her  dermatologist, Dr. Terri Piedra, and we settled on a both oral and topical  treatment for her psoriasis, consisting of ketoconazole to treat an obvious  systemic yeast infection as well as Dovonex and triamcinolone ointments.  The patient responded initially favorably to the use of __________ on the  first night with decreased leg swelling and erythema to the right thigh.  With the addition of Zosyn for better anaerobic coverage, the patient  developed an acute flair of macular lesions on her forehead and increased  intensity of her psoriatic plaques with new plaques on the chest and arms,  making me concerned for a potential drug reaction.  We therefore  discontinued her Zosyn and started her on Cipro to cover both recent UTI as  well as skin anaerobic coverage and clindamycin.  As the lesions began to  respond, it was obvious that the clindamycin did not need to be continued.  Therefore, it was discontinued, I believe, on day #3.  Her ciprofloxacin was  continued as was oral Diflucan to treat obvious systemic yeast infection  with probable superinfection of her psoriasis.  The patient continued to  respond favorably with topical as well as oral therapy.  We added  hydrochlorothiazide for the lower extremity edema which was  obviously  chronic and complicating the psoriatic plaques.  The patient has tolerated  hydrochlorothiazide 25 mg daily quite favorably with decrease in swelling of  the lower extremities.  A repeat weight has not been accomplished during the  course of the hospital stay, and so I cannot comment on the decrease in  total body losses.  I would recommend continuing this at discharge, as she  appears to be dependent on this and benefitting from it.  The patient was  seen in-hospital by our diabetic management team, and  we worked together to  attempt to put together a simplified diabetic care management.  The patient  exhibited some understanding and knowledge of the importance of her diet;  however, it is controlled quite intensely by her internal drives and  comfort, and therefore, tends to avoid foods that would otherwise favorably  impact her diabetes.  She has requested potential dietary conference with  both she and her husband in order to facilitate appropriate dietary management at home, as the patient's husband is the primary food-delivering  agent in the house, and he tends to bring her things that he would eat and  things that are easy.  We will therefore make an outpatient appointment for  them for the diabetic management office in order to attempt to educate both  of them on appropriate food choices.  The patient's hospital course was  complicated by the onset of diarrhea.  At this time, one C. difficile was  negative.  We have discontinued her bowel-softening agent and are awaiting a  second C. difficile culture.  We will start Questran today, just once a day  dose and ensure that the second C. difficile is negative before starting  Imodium.  Once her diarrhea has resolved, the patient likely could go home  with home health care for her Foley as well as her skin care.  At this time,  with the diarrhea and her morbid obesity, I feel that she would return to  the hospital quite  quickly because she would be unable to care for herself.   Future options for this lady with regard to her morbid obesity and  deconditioning are physical therapy at home which should initiated at  discharge.  I have spoken with Dr. Faith Rogue, who will gladly receive  information from the physical therapist at home regarding her level of  ability and progress.  If she appears not be able to progress at home, he  would consider her for an inpatient stay if appropriate.   The patient's other medical conditions that have impacted her during this  stay, her Sjogren's syndrome, myasthenia gravis, and antiphospholipid  syndrome appeared to be at baseline.  Because of her lower extremity edema  and antiphospholipid syndrome, I did ultrasound her lower extremities to  assure that there were no occult blood clots.  This test was negative.  She  did not need any intervention at this time for these conditions.  During the  course of this stay, her asthma did flare on two occasions but responded  favorably to her nebulizer.  She takes albuterol 2.5 mg daily p.r.n.   At this time, if the patient's diarrhea resolves and she has adequate home  care, she likely could be discharged to home with physical therapy and  outpatient diabetic care management.  Please see the follow up discharge  summary  from my partner with the new medication list and any changes in her progress  between the time of this dictation and her actual discharge.  Please note  that patient has been provided with the paperwork for a home glucose  monitor, and we will instruct her on delivery of her insulin as well as the  checking of her sugars.      MLT/MEDQ  D:  05/21/2004  T:  05/21/2004  Job:  [id number]   cc:   Ranelle Oyster, M.D.  [address] [place]  [place] [address]  Fax: [phone]   Tally Joe, M.D.  [phone]  [address]  [place], [place] [address]  Fax: [phone]   Elinor Parkinson. Worthy Rancher, M.D.  [address] [place]  [place] [address]  Fax: [phone]

## 2010-07-20 NOTE — Discharge Summary (Signed)
[name], [name] NO.:  [id number]   MEDICAL RECORD NO.:  [id number]          PATIENT TYPE:  INP   LOCATION:  3035                         FACILITY:  [facility]   PHYSICIAN:  Lonia Blood, M.D.       DATE OF BIRTH:  [birth year]   DATE OF ADMISSION:  02/28/2006  DATE OF DISCHARGE:  03/21/2006                               DISCHARGE SUMMARY   For a complete list of discharge diagnoses please refer to the  previously 2 dictated discharge summaries done by Dr. Brien Few on March 12, 2006 as well as Dr. Angelena Sole on March 16, 2006.   From March 17, 2006 to March 21, 2006, the patient has been waiting  patiently on a regular floor at [facility] for a bed in a  skilled nursing facility.  On March 21, 2006, a bed opened up at a  local SNF and [name] was transferred there via ambulance.  None of  her medications were altered and none of her acute medical problems have  changed.      Lonia Blood, M.D.  Electronically Signed     SL/MEDQ  D:  04/30/2006  T:  05/01/2006  Job:  [id number]   cc:   Olene Craven, M.D.

## 2010-07-20 NOTE — Discharge Summary (Signed)
[place]   Patient:    [name], [name] Visit Number: [id number] MRN: [id number]          Service Type: MED Attending Physician:  Gracelyn Nurse Dictated by:   Gracelyn Nurse, M.D. Admit Date:  05/22/2001 Discharge Date: 06/01/2001                             Discharge Summary  ADDENDUM   DISCHARGE MEDICATIONS: 1. Diflucan 100 mg b.i.d. x1 week. 2. Keflex 500 mg t.i.d. x2 weeks. 3. Vicodin 5/50 q.4h. p.r.n. 4. Temovate cream 0.05% apply twice a day. 5. Dovonex cream 0.005% apply b.i.d. 6. Silvadene 1% cream apply b.i.d. 7. Triamcinolone 1% mixed 1:1 with Silvadene 1% apply to groin twice a day. 8. Prozac 20 mg q.d. 9. Celebrex 200 mg q.d. Dictated by:   Gracelyn Nurse, M.D. Attending Physician:  Marcelino Duster D DD:  06/01/01 TD:  06/01/01 Job: [id number]

## 2010-07-20 NOTE — H&P (Signed)
[name], COLCLASURE NO.:  [id number]   MEDICAL RECORD NO.:  [id number]          PATIENT TYPE:  EMS   LOCATION:  ED                           FACILITY:  [facility]   PHYSICIAN:  Jackie Plum, M.D.DATE OF BIRTH:  1945-07-31   DATE OF ADMISSION:  05/15/2004  DATE OF DISCHARGE:                                HISTORY & PHYSICAL   CHIEF COMPLAINT:  Right lower extremity pain, redness, and swelling.   HISTORY OF PRESENT ILLNESS:  The patient is a 65 year old Caucasian lady  with a history of psoriasis, myasthenia gravis, and antiphospholipid  antibody syndrome.  She has a history of Sjogren's syndrome.  The patient  presented to the ED on account of the above complaints.  She had gone to see  her primary care physician, and was referred to the ED on account of  worsening swelling and redness of the right lower extremity.  In the  emergency room, x-rays of the feet were done, the results of which are still  pending at the time of this dictation.  The hospitalist service was asked to  verify admission.  The patient gives a history of low-grade fever without  chills.  She indicates that she has pain in the affected area.  No history  of chest pain or shortness of breath.   PAST MEDICAL HISTORY:  As in the HPI above.   CURRENT MEDICATIONS:  1.  Effexor XR 37.5 daily.  2.  Aspirin 81 mg daily for her antiphospholipid antibody syndrome.  3.  Prozac 20 mg daily.  4.  Zantac.  5.  Phentamine.  6.  Methadone 20 mg daily.   SOCIAL HISTORY:  She lives with her husbnd, who was with her.  She gets  around with a wheelchair.   PHYSICAL EXAMINATION:  VITAL SIGNS:  Blood pressure 126/73, pulse 98,  respirations 98% on room air.  GENERAL:  Morbidly obese lady sitting in a wheelchair, in no acute  cardiopulmonary distress.  She was not toxic looking.  She was not in  distress.  HEENT:  Normocephalic and atraumatic.  Pupils equal, round and reactive to  light.  Extraocular  movements were intact.  Oropharynx moist.  No exudate or  erythema.  LUNGS:  Equal breath sounds.  No crackles.  CARDIAC:  Regular.  No gallops.  ABDOMEN:  Obese, soft, nontender.  EXTREMITIES:  She has typical psoriatic lesions of silvery scales on an  erythematous base on all extremities.  In addition, she has worsening  erythema superimposed on areas of psoriatic lesions on the right lower  extremity, encroaching up to her lower right side.  They are warm to touch  and tender to palpation.  CNS:  Nonfocal.   LABORATORY WORK:  WBC count 13.6, hematocrit 14.2, hematocrit 41.3, MCV  82.0, platelet count of 208.  Sodium 154, potassium 3.8, chloride 97, CO2 of  28, glucose 268, BUN 8, creatinine 0.8, calcium 8.6.   IMPRESSION:  1.  Psoriasis with superimposed secondary cellulitis.  Still has a skin      infection.  Plan - the patient will be admitted for  IV antibiotics.      Will need to follow up on her x-rays.  2.  Hyperglycemia in an obese lady.  Rule out diabetes mellitus.      Supplemental analgesics and other supportive measures while the patient      is in the hospital.  She will get blood work fasting  glucose level, as      well as lipids in the morning      GO/MEDQ  D:  05/15/2004  T:  05/15/2004  Job:  [id number]

## 2010-12-14 LAB — URINALYSIS, ROUTINE W REFLEX MICROSCOPIC
Glucose, UA: NEGATIVE
Nitrite: POSITIVE — AB
Specific Gravity, Urine: 1.029
pH: 6

## 2010-12-14 LAB — BASIC METABOLIC PANEL
BUN: 11
Creatinine, Ser: 0.65
GFR calc non Af Amer: >60
Glucose, Bld: 149 — ABNORMAL HIGH

## 2010-12-14 LAB — URINE MICROSCOPIC-ADD ON

## 2010-12-14 LAB — CBC
MCV: 78.5
Platelets: 237
RDW: 17.9 — ABNORMAL HIGH
WBC: 10.8 — ABNORMAL HIGH

## 2011-01-17 ENCOUNTER — Inpatient Hospital Stay (HOSPITAL_COMMUNITY)
Admission: EM | Admit: 2011-01-17 | Discharge: 2011-01-22 | DRG: 603 | Disposition: A | Discharge status: Skilled Nursing Facility | Payer: Medicare Other | Attending: Internal Medicine | Admitting: Internal Medicine

## 2011-01-17 DIAGNOSIS — Z6841 Body Mass Index (BMI) 40.0 and over, adult: Secondary | ICD-10-CM

## 2011-01-17 DIAGNOSIS — IMO0001 Reserved for inherently not codable concepts without codable children: Secondary | ICD-10-CM | POA: Diagnosis present

## 2011-01-17 DIAGNOSIS — F411 Generalized anxiety disorder: Secondary | ICD-10-CM | POA: Diagnosis present

## 2011-01-17 DIAGNOSIS — L02419 Cutaneous abscess of limb, unspecified: Secondary | ICD-10-CM | POA: Diagnosis present

## 2011-01-17 DIAGNOSIS — D6861 Antiphospholipid syndrome: Secondary | ICD-10-CM | POA: Diagnosis present

## 2011-01-17 DIAGNOSIS — B354 Tinea corporis: Secondary | ICD-10-CM | POA: Diagnosis present

## 2011-01-17 DIAGNOSIS — F329 Major depressive disorder, single episode, unspecified: Secondary | ICD-10-CM | POA: Diagnosis present

## 2011-01-17 DIAGNOSIS — Z8614 Personal history of Methicillin resistant Staphylococcus aureus infection: Secondary | ICD-10-CM

## 2011-01-17 DIAGNOSIS — F3289 Other specified depressive episodes: Secondary | ICD-10-CM | POA: Diagnosis present

## 2011-01-17 DIAGNOSIS — D6859 Other primary thrombophilia: Secondary | ICD-10-CM | POA: Diagnosis present

## 2011-01-17 DIAGNOSIS — L408 Other psoriasis: Secondary | ICD-10-CM | POA: Diagnosis present

## 2011-01-17 DIAGNOSIS — L0390 Cellulitis, unspecified: Secondary | ICD-10-CM

## 2011-01-17 DIAGNOSIS — L02219 Cutaneous abscess of trunk, unspecified: Principal | ICD-10-CM | POA: Diagnosis present

## 2011-01-17 DIAGNOSIS — G7000 Myasthenia gravis without (acute) exacerbation: Secondary | ICD-10-CM | POA: Diagnosis present

## 2011-01-17 HISTORY — DX: Urinary tract infection, site not specified: N39.0

## 2011-01-17 HISTORY — DX: Depression, unspecified: F32.A

## 2011-01-17 HISTORY — DX: Psoriasis, unspecified: L40.9

## 2011-01-17 HISTORY — DX: Sjogren syndrome, unspecified: M35.00

## 2011-01-17 HISTORY — DX: Other complications of anesthesia, initial encounter: T88.59XA

## 2011-01-17 HISTORY — DX: Dorsalgia, unspecified: M54.9

## 2011-01-17 HISTORY — DX: Major depressive disorder, single episode, unspecified: F32.9

## 2011-01-17 HISTORY — DX: Morbid (severe) obesity due to excess calories: E66.01

## 2011-01-17 HISTORY — DX: Anxiety disorder, unspecified: F41.9

## 2011-01-17 HISTORY — DX: Other chronic pain: G89.29

## 2011-01-17 HISTORY — DX: Cellulitis, unspecified: L03.90

## 2011-01-17 HISTORY — DX: Methicillin resistant Staphylococcus aureus infection as the cause of diseases classified elsewhere: B95.62

## 2011-01-17 HISTORY — DX: Diverticulosis of intestine, part unspecified, without perforation or abscess without bleeding: K57.90

## 2011-01-17 HISTORY — DX: Adverse effect of unspecified anesthetic, initial encounter: T41.45XA

## 2011-01-17 HISTORY — DX: Antiphospholipid syndrome: D68.61

## 2011-01-17 HISTORY — DX: Ventral hernia without obstruction or gangrene: K43.9

## 2011-01-17 LAB — DIFFERENTIAL
Basophils Absolute: 0 10*3/uL (ref 0.0–0.1)
Basophils Relative: 0 % (ref 0–1)
Lymphocytes Relative: 13 % (ref 12–46)
Monocytes Absolute: 0.5 10*3/uL (ref 0.1–1.0)
Neutro Abs: 9.1 10*3/uL — ABNORMAL HIGH (ref 1.7–7.7)
Neutrophils Relative %: 81 % — ABNORMAL HIGH (ref 43–77)

## 2011-01-17 LAB — COMPREHENSIVE METABOLIC PANEL
ALT: 10 U/L (ref 0–35)
AST: 12 U/L (ref 0–37)
Albumin: 2.8 g/dL — ABNORMAL LOW (ref 3.5–5.2)
Alkaline Phosphatase: 140 U/L — ABNORMAL HIGH (ref 39–117)
CO2: 31 mEq/L (ref 19–32)
Chloride: 100 mEq/L (ref 96–112)
Creatinine, Ser: 0.72 mg/dL (ref 0.50–1.10)
GFR calc non Af Amer: 88 mL/min — ABNORMAL LOW (ref >90)
Potassium: 4 mEq/L (ref 3.5–5.1)
Sodium: 137 mEq/L (ref 135–145)
Total Bilirubin: 0.1 mg/dL — ABNORMAL LOW (ref 0.3–1.2)

## 2011-01-17 LAB — CBC
HCT: 38.3 % (ref 36.0–46.0)
Hemoglobin: 12.1 g/dL (ref 12.0–15.0)
MCHC: 31.6 g/dL (ref 30.0–36.0)
RDW: 14.6 % (ref 11.5–15.5)
WBC: 11.2 10*3/uL — ABNORMAL HIGH (ref 4.0–10.5)

## 2011-01-17 MED ORDER — CLINDAMYCIN PHOSPHATE 600 MG/50ML IV SOLN
600.0000 mg | Freq: Once | INTRAVENOUS | Status: AC
  Administered 2011-01-17: 600 mg via INTRAVENOUS
  Filled 2011-01-17: qty 50

## 2011-01-17 MED ORDER — SODIUM CHLORIDE 0.9 % IV BOLUS (SEPSIS)
1000.0000 mL | Freq: Once | INTRAVENOUS | Status: AC
Start: 2011-01-17 — End: 2011-01-17
  Administered 2011-01-17: 1000 mL via INTRAVENOUS

## 2011-01-17 MED ORDER — INSULIN ASPART 100 UNIT/ML ~~LOC~~ SOLN
0.0000 [IU] | Freq: Every day | SUBCUTANEOUS | Status: DC
  Administered 2011-01-19 – 2011-01-21 (×2): 2 [IU] via SUBCUTANEOUS

## 2011-01-17 MED ORDER — ONDANSETRON HCL 4 MG/2ML IJ SOLN
4.0000 mg | Freq: Once | INTRAMUSCULAR | Status: AC
  Administered 2011-01-17: 4 mg via INTRAVENOUS
  Filled 2011-01-17: qty 2

## 2011-01-17 MED ORDER — HYDROMORPHONE HCL PF 1 MG/ML IJ SOLN
1.0000 mg | Freq: Once | INTRAMUSCULAR | Status: AC
  Administered 2011-01-17: 1 mg via INTRAVENOUS
  Filled 2011-01-17: qty 1

## 2011-01-17 MED ORDER — INSULIN ASPART 100 UNIT/ML ~~LOC~~ SOLN
0.0000 [IU] | Freq: Three times a day (TID) | SUBCUTANEOUS | Status: DC
  Administered 2011-01-18: 11 [IU] via SUBCUTANEOUS
  Administered 2011-01-18 (×2): 4 [IU] via SUBCUTANEOUS
  Administered 2011-01-19: 7 [IU] via SUBCUTANEOUS
  Administered 2011-01-19 – 2011-01-21 (×5): 4 [IU] via SUBCUTANEOUS
  Administered 2011-01-22: 3 [IU] via SUBCUTANEOUS
  Administered 2011-01-22: 4 [IU] via SUBCUTANEOUS
  Filled 2011-01-17: qty 3

## 2011-01-17 MED ORDER — INSULIN ASPART 100 UNIT/ML ~~LOC~~ SOLN
6.0000 [IU] | Freq: Three times a day (TID) | SUBCUTANEOUS | Status: DC
  Administered 2011-01-18 – 2011-01-22 (×14): 6 [IU] via SUBCUTANEOUS

## 2011-01-17 NOTE — ED Provider Notes (Signed)
History     CSN: [id number] Arrival date & time: 01/17/2011  4:49 PM   First MD Initiated Contact with Patient 01/17/11 1701      Chief Complaint  Patient presents with  . Wound Infection    (Consider location/radiation/quality/duration/timing/severity/associated sxs/prior treatment) HPI This elderly female with multiple medical problems now presents with concerns of skin lesions. Notably the patient has a history of recurrent cellulitis, as well as psoriasis. She notes that over the past 3 weeks she has gradually developed increasing erythema, distention, and episodic spontaneous drainage on lesions along her right side. The patient is morbidly obese, and the lesions and multiple skin folds about the right superior hip. Since onset she has not achieved relief with oral narcotics. The pain is described as a burning, sharp pain. She notes that over this timeframe she has been subjectively febrile, nauseous, with 2 episodes of emesis within the past several days. She denies any disorientation, confusion, chest pain, new dyspnea, significant abdominal pain, or any other focal new complaints. Past Medical History  Diagnosis Date  . MRSA cellulitis   . Psoriasis   . Diabetes mellitus   . Obesities, morbid   . Sjogren's disease   . UTI (urinary tract infection)   . Myasthenia gravis   . Antiphospholipid syndrome   . Diverticulosis   . Anxiety   . Depression   . Asthma   . Ventral hernia     Past Surgical History  Procedure Date  . Cholecystectomy   . Abdominal hysterectomy     History reviewed. No pertinent family history.  History  Substance Use Topics  . Smoking status: Former Games developer  . Smokeless tobacco: Never Used  . Alcohol Use: No    OB History    Grav Para Term Preterm Abortions TAB SAB Ect Mult Living                  Review of Systems Gen: Per HPI HEENT: No HA CV: No CP Resp: No new dyspnea Abd: Per HPI, otherwise negative Musk: Per HPI, otherwise  negative Neuro: No dysesthesia, or focal changes GU: Per HPI, otherwise negative Skin: hpi Psych: Neg  Allergies  Penicillins  Home Medications  No current outpatient prescriptions on file.  BP 112/58  Pulse 112  Temp(Src) 99.5 F (37.5 C) (Oral)  Resp 22  SpO2 95%  Physical Exam  Constitutional: She is oriented to person, place, and time.       This morbidly obese female is reclining in a bariatric bed, with visible shortness of breath. She is otherwise in no distress.  HENT:  Head: Normocephalic and atraumatic.  Eyes: Conjunctivae are normal. Pupils are equal, round, and reactive to light.  Cardiovascular: Tachycardia present.   Pulmonary/Chest: Tachypnea noted. No respiratory distress. She has decreased breath sounds.  Abdominal: Bowel sounds are normal.       There are multiple redundant skin fold about the patient's abdomen. The patient does not have tenderness to palpation, other than on the focal skin lesions.  Musculoskeletal: She exhibits edema. She exhibits no tenderness.  Neurological: She is alert and oriented to person, place, and time. No cranial nerve deficit. Coordination normal.  Skin:       There are diffuse, innumerable psoriatic lesions. Notably on the patient's right hip, and to confluent patches, there is minimally raised, erythematous, indurated skin. There is no focal fluctuance, though there is a mild area of spontaneous drainage on the inferior of these lesions.  Psychiatric: She has  a normal mood and affect.    ED Course  Procedures (including critical care time)  Labs Reviewed - No data to display No results found.   No diagnosis found.    MDM  This 65 year old female with multiple medical problems now presents with concerns over worsening of right sided skin lesions. On exam the patient is in no distress, though she is grossly deconditioned, and morbidly obese. The patient's skin lesions are suggestive of cellulitis, though this may be  progression of her chronic psoriatic changes. Given the description of the patient has a relatively rapid progression, there is concern for cellulitis. With patient's multiple medical problems, tinnitus or progression of disease she will be admitted for further evaluation and management, and anticipated wound care.        Gerhard Munch, MD 01/17/11 2028

## 2011-01-17 NOTE — ED Notes (Signed)
ZOX:[id number]<AV> Expected date:01/17/11<BR> Expected time: 4:05 PM<BR> Means of arrival:Ambulance<BR> Comments:<BR> EMS 26 PTAR, 65 yof infected wound ETA 10

## 2011-01-17 NOTE — H&P (Signed)
PCP:   Pearson Grippe, MD, MD   Chief Complaint: Increased pain and redness on the right side   HPI: [name] is an 65 y.o. female nursing home resident with history of morbid obesity, prior MRSA cellulitis, psoriasis, phospholipid syndrome, Sjogren's syndrome, myasthenia gravis, brought into the [facility] long emergency room because she has increase in redness, swelling,  and pain, not controlled by her usual pain medications. She was given clindamycin in the emergency room.  Rewiew of Systems:  The patient denies anorexia, fever, weight loss,, vision loss, decreased hearing, hoarseness, chest pain, syncope, dyspnea on exertion, peripheral edema, balance deficits, hemoptysis, abdominal pain, melena, hematochezia, severe indigestion/heartburn, hematuria, incontinence, genital sores, muscle weakness, suspicious skin lesions, transient blindness, difficulty walking, depression, unusual weight change, abnormal bleeding, enlarged lymph nodes, angioedema, and breast masses.    Past Medical History  Diagnosis Date  . MRSA cellulitis   . Psoriasis   . Obesities, morbid   . Sjogren's disease   . UTI (urinary tract infection)   . Antiphospholipid syndrome   . Diverticulosis   . Anxiety   . Depression   . Asthma   . Ventral hernia   . Diabetes mellitus   . Myasthenia gravis   . Myasthenia gravis   . Myasthenia gravis   . Back pain, chronic     missing some vertebra per pt  . Complication of anesthesia     can't have muscle relaxers -my. gravis    Past Surgical History  Procedure Date  . Abdominal hysterectomy   . Pilonidal cyst excision   . Cholecystectomy   . Appendectomy     Medications:  HOME MEDS: Prior to Admission medications   Medication Sig Start Date End Date Taking? Authorizing Provider  acidophilus (RISAQUAD) CAPS Take 1 capsule by mouth 2 (two) times daily.     Yes Historical Provider, MD  albuterol (PROVENTIL HFA;VENTOLIN HFA) 108 (90 BASE) MCG/ACT inhaler Inhale  1-2 puffs into the lungs every 4 (four) hours as needed. shortness of breath \     Yes Historical Provider, MD  aspirin EC 81 MG tablet Take 81 mg by mouth daily.     Yes Historical Provider, MD  buPROPion (WELLBUTRIN XL) 150 MG 24 hr tablet Take 150 mg by mouth daily.     Yes Historical Provider, MD  cholecalciferol (VITAMIN D) 400 UNITS TABS Take 800 Units by mouth daily.     Yes Historical Provider, MD  FLUoxetine (PROZAC) 10 MG capsule Take 10 mg by mouth daily.     Yes Historical Provider, MD  furosemide (LASIX) 20 MG tablet Take 20 mg by mouth 2 (two) times daily.     Yes Historical Provider, MD  glyBURIDE (DIABETA) 5 MG tablet Take 5 mg by mouth daily with breakfast.     Yes Historical Provider, MD  HYDROmorphone (DILAUDID) 2 MG tablet Take 2 mg by mouth every 6 (six) hours as needed. For pain    Yes Historical Provider, MD  lamoTRIgine (LAMICTAL) 25 MG tablet Take 50 mg by mouth daily.     Yes Historical Provider, MD  methadone (DOLOPHINE) 10 MG tablet Take 10 mg by mouth 2 (two) times daily.     Yes Historical Provider, MD  methylcellulose (ARTIFICIAL TEARS) 1 % ophthalmic solution Place 1 drop into both eyes daily as needed.     Yes Historical Provider, MD  omeprazole (PRILOSEC) 20 MG capsule Take 20 mg by mouth daily.     Yes Historical Provider, MD  sodium chloride (OCEAN) 0.65 % nasal spray Place 1 spray into the nose as needed. May keep at bedside    Yes Historical Provider, MD     Allergies:  Allergies  Allergen Reactions  . Penicillins Other (See Comments)    Reaction=unknown    Social History:   reports that she has quit smoking. She has never used smokeless tobacco. She reports that she does not drink alcohol or use illicit drugs.  Family History: History reviewed. No pertinent family history.   Physical Exam: Filed Vitals:   01/17/11 1626 01/17/11 1649 01/17/11 2015  BP: 128/72 112/58 137/60  Pulse:  112 110  Temp:  99.5 F (37.5 C)   TempSrc:  Oral   Resp:   22 20  SpO2:  95% 92%   Blood pressure 137/60, pulse 110, temperature 99.5 F (37.5 C), temperature source Oral, resp. rate 20, SpO2 92.00%.  GEN:  Pleasant person lying in the stretcher in no acute distress; cooperative with exam.  She is morbidly obese PSYCH:  alert and oriented x4; does not appear anxious does not appear depressed; affect is normal HEENT: Mucous membranes pink and anicteric; PERRLA; EOM intact; no cervical lymphadenopathy nor thyromegaly or carotid bruit; no JVD; Breasts:: Not examined CHEST WALL: No tenderness CHEST: Normal respiration, clear to auscultation bilaterally HEART: Regular rate and rhythm; no murmurs rubs or gallops BACK: No kyphosis or scoliosis; no CVA tenderness ABDOMEN: Obese, soft non-tender; no masses, no organomegaly, normal abdominal bowel sounds; no pannus; no intertriginous candida. Rectal Exam: Not done EXTREMITIES: No bone or joint deformity; age-appropriate arthropathy of the hands and knees; no edema; no ulcerations. Genitalia: not examined PULSES: 2+ and symmetric SKIN: There are severe psoriatic patches throughout her body.  There is erythema on her right buttock area extending to her left flank.  There is severe tenderness.  She also has bilateral lower ext edema as well. CNS: Cranial nerves 2-12 grossly intact no focal neurologic deficit   Labs & Imaging Results for orders placed during the hospital encounter of 01/17/11 (from the past 48 hour(s))  CBC     Status: Abnormal   Collection Time   01/17/11  6:15 PM      Component Value Range Comment   WBC 11.2 (*) 4.0 - 10.5 (K/uL)    RBC 4.75  3.87 - 5.11 (MIL/uL)    Hemoglobin 12.1  12.0 - 15.0 (g/dL)    HCT 16.1  09.6 - 04.5 (%)    MCV 80.6  78.0 - 100.0 (fL)    MCH 25.5 (*) 26.0 - 34.0 (pg)    MCHC 31.6  30.0 - 36.0 (g/dL)    RDW 40.9  81.1 - 91.4 (%)    Platelets 214  150 - 400 (K/uL)   DIFFERENTIAL     Status: Abnormal   Collection Time   01/17/11  6:15 PM      Component  Value Range Comment   Neutrophils Relative 81 (*) 43 - 77 (%)    Neutro Abs 9.1 (*) 1.7 - 7.7 (K/uL)    Lymphocytes Relative 13  12 - 46 (%)    Lymphs Abs 1.4  0.7 - 4.0 (K/uL)    Monocytes Relative 5  3 - 12 (%)    Monocytes Absolute 0.5  0.1 - 1.0 (K/uL)    Eosinophils Relative 1  0 - 5 (%)    Eosinophils Absolute 0.1  0.0 - 0.7 (K/uL)    Basophils Relative 0  0 - 1 (%)  Basophils Absolute 0.0  0.0 - 0.1 (K/uL)   COMPREHENSIVE METABOLIC PANEL     Status: Abnormal   Collection Time   01/17/11  6:15 PM      Component Value Range Comment   Sodium 137  135 - 145 (mEq/L)    Potassium 4.0  3.5 - 5.1 (mEq/L)    Chloride 100  96 - 112 (mEq/L)    CO2 31  19 - 32 (mEq/L)    Glucose, Bld 275 (*) 70 - 99 (mg/dL)    BUN 11  6 - 23 (mg/dL)    Creatinine, Ser 6.21  0.50 - 1.10 (mg/dL)    Calcium 30.8  8.4 - 10.5 (mg/dL)    Total Protein 6.7  6.0 - 8.3 (g/dL)    Albumin 2.8 (*) 3.5 - 5.2 (g/dL)    AST 12  0 - 37 (U/L)    ALT 10  0 - 35 (U/L)    Alkaline Phosphatase 140 (*) 39 - 117 (U/L)    Total Bilirubin 0.1 (*) 0.3 - 1.2 (mg/dL)    GFR calc non Af Amer 88 (*) >90 (mL/min)    GFR calc Af Amer >90  >90 (mL/min)    No results found.    Assessment Present on Admission:  .Psoriasis (a type of skin inflammation) .Cellulitis and abscess of buttock .Diabetes mellitus .Obesity (BMI 30-39.9) .Myasthenia gravis .Antiphospholipid syndrome   PLAN:  Need to be admitted to general medical floor and given adequate pain control.  She will be given clindamycin and will get a wound care consult.  Her other medications will be continued and she will be put on insulin sliding scale.  He is currently a full code and we will honor this wish.  It seems that she need more of the local wound care rather than antibiotic treatment, as I don't think infection is a major role here.   Other plans as per orders.    [name] 01/17/2011, 9:11 PM

## 2011-01-18 DIAGNOSIS — L02219 Cutaneous abscess of trunk, unspecified: Secondary | ICD-10-CM

## 2011-01-18 DIAGNOSIS — L03319 Cellulitis of trunk, unspecified: Secondary | ICD-10-CM

## 2011-01-18 DIAGNOSIS — L0231 Cutaneous abscess of buttock: Secondary | ICD-10-CM

## 2011-01-18 DIAGNOSIS — L02419 Cutaneous abscess of limb, unspecified: Secondary | ICD-10-CM

## 2011-01-18 DIAGNOSIS — L03119 Cellulitis of unspecified part of limb: Secondary | ICD-10-CM

## 2011-01-18 DIAGNOSIS — L03317 Cellulitis of buttock: Secondary | ICD-10-CM

## 2011-01-18 LAB — CBC
HCT: 37.3 % (ref 36.0–46.0)
MCHC: 31.1 g/dL (ref 30.0–36.0)
RDW: 14.8 % (ref 11.5–15.5)

## 2011-01-18 LAB — GLUCOSE, CAPILLARY

## 2011-01-18 LAB — BASIC METABOLIC PANEL
BUN: 10 mg/dL (ref 6–23)
Chloride: 100 mEq/L (ref 96–112)
Creatinine, Ser: 0.78 mg/dL (ref 0.50–1.10)
GFR calc Af Amer: >90 mL/min (ref >90)

## 2011-01-18 LAB — MRSA PCR SCREENING: MRSA by PCR: POSITIVE — AB

## 2011-01-18 LAB — HEMOGLOBIN A1C: Hgb A1c MFr Bld: 8.8 % — ABNORMAL HIGH (ref <5.7)

## 2011-01-18 MED ORDER — PANTOPRAZOLE SODIUM 40 MG PO TBEC
40.0000 mg | DELAYED_RELEASE_TABLET | Freq: Every day | ORAL | Status: DC
  Administered 2011-01-18 – 2011-01-22 (×5): 40 mg via ORAL
  Filled 2011-01-18 (×6): qty 1

## 2011-01-18 MED ORDER — BUPROPION HCL ER (XL) 150 MG PO TB24
150.0000 mg | ORAL_TABLET | Freq: Every day | ORAL | Status: DC
  Administered 2011-01-18 – 2011-01-22 (×5): 150 mg via ORAL
  Filled 2011-01-18 (×6): qty 1

## 2011-01-18 MED ORDER — ENOXAPARIN SODIUM 40 MG/0.4ML ~~LOC~~ SOLN
40.0000 mg | SUBCUTANEOUS | Status: DC
  Administered 2011-01-18 – 2011-01-22 (×5): 40 mg via SUBCUTANEOUS
  Filled 2011-01-18 (×6): qty 0.4

## 2011-01-18 MED ORDER — CHOLECALCIFEROL 10 MCG (400 UNIT) PO TABS
800.0000 [IU] | ORAL_TABLET | Freq: Every day | ORAL | Status: DC
  Administered 2011-01-18 – 2011-01-22 (×5): 800 [IU] via ORAL
  Filled 2011-01-18 (×6): qty 2

## 2011-01-18 MED ORDER — SODIUM CHLORIDE 0.9 % IV SOLN
250.0000 mL | INTRAVENOUS | Status: DC
  Administered 2011-01-18 – 2011-01-22 (×5): 250 mL via INTRAVENOUS

## 2011-01-18 MED ORDER — GLYBURIDE 5 MG PO TABS
5.0000 mg | ORAL_TABLET | Freq: Every day | ORAL | Status: DC
  Administered 2011-01-18 – 2011-01-22 (×5): 5 mg via ORAL
  Filled 2011-01-18 (×6): qty 1

## 2011-01-18 MED ORDER — POLYVINYL ALCOHOL 1.4 % OP SOLN
1.0000 [drp] | Freq: Every day | OPHTHALMIC | Status: DC | PRN
  Administered 2011-01-19: 1 [drp] via OPHTHALMIC
  Filled 2011-01-18: qty 15

## 2011-01-18 MED ORDER — SALINE NASAL SPRAY 0.65 % NA SOLN
1.0000 | NASAL | Status: DC | PRN
  Administered 2011-01-19: 1 via NASAL
  Filled 2011-01-18: qty 44

## 2011-01-18 MED ORDER — METHADONE HCL 10 MG PO TABS
10.0000 mg | ORAL_TABLET | Freq: Two times a day (BID) | ORAL | Status: DC
  Administered 2011-01-18 – 2011-01-22 (×10): 10 mg via ORAL
  Filled 2011-01-18 (×10): qty 1

## 2011-01-18 MED ORDER — FUROSEMIDE 20 MG PO TABS
20.0000 mg | ORAL_TABLET | Freq: Two times a day (BID) | ORAL | Status: DC
  Administered 2011-01-18 – 2011-01-22 (×10): 20 mg via ORAL
  Filled 2011-01-18 (×13): qty 1

## 2011-01-18 MED ORDER — ASPIRIN EC 81 MG PO TBEC
81.0000 mg | DELAYED_RELEASE_TABLET | Freq: Every day | ORAL | Status: DC
  Administered 2011-01-18 – 2011-01-22 (×5): 81 mg via ORAL
  Filled 2011-01-18 (×6): qty 1

## 2011-01-18 MED ORDER — LAMOTRIGINE 25 MG PO TABS
50.0000 mg | ORAL_TABLET | Freq: Every day | ORAL | Status: DC
  Administered 2011-01-18 – 2011-01-22 (×5): 50 mg via ORAL
  Filled 2011-01-18 (×6): qty 2

## 2011-01-18 MED ORDER — RISAQUAD PO CAPS
1.0000 | ORAL_CAPSULE | Freq: Two times a day (BID) | ORAL | Status: DC
  Administered 2011-01-18 – 2011-01-22 (×10): 1 via ORAL
  Filled 2011-01-18 (×13): qty 1

## 2011-01-18 MED ORDER — FLUOXETINE HCL 10 MG PO CAPS
10.0000 mg | ORAL_CAPSULE | Freq: Every day | ORAL | Status: DC
  Administered 2011-01-18 – 2011-01-22 (×5): 10 mg via ORAL
  Filled 2011-01-18 (×6): qty 1

## 2011-01-18 MED ORDER — HYDROMORPHONE HCL PF 2 MG/ML IJ SOLN
2.0000 mg | INTRAMUSCULAR | Status: DC | PRN
  Administered 2011-01-18 – 2011-01-20 (×20): 2 mg via INTRAVENOUS
  Filled 2011-01-18 (×20): qty 1

## 2011-01-18 MED ORDER — SODIUM CHLORIDE 0.9 % IJ SOLN
3.0000 mL | INTRAMUSCULAR | Status: DC | PRN
  Administered 2011-01-18: 3 mL via INTRAVENOUS

## 2011-01-18 MED ORDER — CLINDAMYCIN PHOSPHATE 600 MG/50ML IV SOLN
600.0000 mg | Freq: Four times a day (QID) | INTRAVENOUS | Status: DC
  Administered 2011-01-18 – 2011-01-22 (×19): 600 mg via INTRAVENOUS
  Filled 2011-01-18 (×22): qty 50

## 2011-01-18 MED ORDER — SODIUM CHLORIDE 0.9 % IJ SOLN
3.0000 mL | Freq: Two times a day (BID) | INTRAMUSCULAR | Status: DC
  Administered 2011-01-18 – 2011-01-20 (×3): 3 mL via INTRAVENOUS
  Administered 2011-01-21: 21:00:00 via INTRAVENOUS

## 2011-01-18 MED ORDER — FLUCONAZOLE 100 MG PO TABS
100.0000 mg | ORAL_TABLET | Freq: Every day | ORAL | Status: DC
  Administered 2011-01-18 – 2011-01-22 (×5): 100 mg via ORAL
  Filled 2011-01-18 (×6): qty 1

## 2011-01-18 MED ORDER — ALBUTEROL SULFATE HFA 108 (90 BASE) MCG/ACT IN AERS
1.0000 | INHALATION_SPRAY | RESPIRATORY_TRACT | Status: DC | PRN
  Filled 2011-01-18: qty 6.7

## 2011-01-18 NOTE — Progress Notes (Signed)
Inpatient Diabetes Program Recommendations  AACE/ADA: New Consensus Statement on Inpatient Glycemic Control (2009)  Target Ranges:  Prepandial:   less than 140 mg/dL      Peak postprandial:   less than 180 mg/dL (1-2 hours)      Critically ill patients:  140 - 180 mg/dL   Reason for Visit: Hyperglycemia  Inpatient Diabetes Program Recommendations Insulin - Basal: Needs basal insulin - Add Lantus 12 units QHS. Titrate until FBS<150 mg/dL Outpatient Referral: OP Diabetes Education consult for elevated HgbA1C - 8.8%  Note:

## 2011-01-18 NOTE — Progress Notes (Signed)
INITIAL ADULT NUTRITION ASSESSMENT Date: 01/18/2011   Time: 3:17 PM Reason for Assessment: Health history  ASSESSMENT: Female 65 y.o.  Dx: Cellulitis and abscess of buttock  Hx:  Past Medical History  Diagnosis Date  . MRSA cellulitis   . Psoriasis   . Obesities, morbid   . Sjogren's disease   . UTI (urinary tract infection)   . Antiphospholipid syndrome   . Diverticulosis   . Anxiety   . Depression   . Asthma   . Ventral hernia   . Diabetes mellitus   . Myasthenia gravis   . Myasthenia gravis   . Myasthenia gravis   . Back pain, chronic     missing some vertebra per pt  . Complication of anesthesia     can't have muscle relaxers -my. gravis   Related Meds:  Scheduled Meds:   . acidophilus  1 capsule Oral BID  . aspirin EC  81 mg Oral Daily  . buPROPion  150 mg Oral Daily  . cholecalciferol  800 Units Oral Daily  . clindamycin (CLEOCIN) IV  600 mg Intravenous Once  . clindamycin (CLEOCIN) IV  600 mg Intravenous Q6H  . enoxaparin  40 mg Subcutaneous Q24H  . fluconazole  100 mg Oral Daily  . FLUoxetine  10 mg Oral Daily  . furosemide  20 mg Oral BID  . glyBURIDE  5 mg Oral Q breakfast  .  HYDROmorphone (DILAUDID) injection  1 mg Intravenous Once  . insulin aspart  0-20 Units Subcutaneous TID WC  . insulin aspart  0-5 Units Subcutaneous QHS  . insulin aspart  6 Units Subcutaneous TID WC  . lamoTRIgine  50 mg Oral Daily  . methadone  10 mg Oral BID  . ondansetron  4 mg Intravenous Once  . pantoprazole  40 mg Oral Q1200  . sodium chloride  1,000 mL Intravenous Once  . sodium chloride  3 mL Intravenous Q12H   Continuous Infusions:   . sodium chloride 250 mL (01/18/11 0103)   PRN Meds:.albuterol, HYDROmorphone, polyvinyl alcohol, sodium chloride, sodium chloride  Ht: 5\' 8"  (172.7 cm)  Wt: 364 lb 10.3 oz (165.4 kg)  Ideal Wt: 63.9 kg  % Ideal Wt: 258  Usual Wt: 145.4-150kg % Usual Wt: 110-113  Body mass index is 55.44 kg/(m^2).  Food/Nutrition  Related Hx: Pt reports not eating well PTA due to not liking the food choices at the facility. Noted pt's weight up from usual, likely related to fluid retention. Pt has not been on any nutritional supplements in the past. Pt reports difficulty swallowing r/t Myasthenia gravis occurs very rarely, typically only when pt is stressed or tired. Pt reports being unable to produce saliva r/t Sjogren's disease, and tries to always sip on water to help with this problem. Pt eating well during lunch. Pt frustrated with meal choices and being on diabetic diet and states that she was not a diabetic diet at her facility and that her blood sugars are elevated because of her wound.   Labs:  CMP     Component Value Date/Time   NA 137 01/18/2011 0515   K 4.0 01/18/2011 0515   CL 100 01/18/2011 0515   CO2 31 01/18/2011 0515   GLUCOSE 220* 01/18/2011 0515   BUN 10 01/18/2011 0515   CREATININE 0.78 01/18/2011 0515   CALCIUM 9.8 01/18/2011 0515   PROT 6.7 01/17/2011 1815   ALBUMIN 2.8* 01/17/2011 1815   AST 12 01/17/2011 1815   ALT 10 01/17/2011 1815  ALKPHOS 140* 01/17/2011 1815   BILITOT 0.1* 01/17/2011 1815   GFRNONAA 86* 01/18/2011 0515   GFRAA >90 01/18/2011 0515   CBG (last 3)   Basename 01/18/11 1140 01/18/11 0746  GLUCAP 279* 186*    Intake/Output Summary (Last 24 hours) at 01/18/11 1527 Last data filed at 01/18/11 1402  Gross per 24 hour  Intake    780 ml  Output      0 ml  Net    780 ml    Diet Order: Carb Control  IVF:    sodium chloride Last Rate: 250 mL (01/18/11 0103)    Estimated Nutritional Needs:   Kcal:1650-1900 Protein:75-95g Fluid:1.6-1.9L  NUTRITION DIAGNOSIS: -Predicted suboptimal energy intake (NI-1.6).  Status: Ongoing -Suspect pt with some degree of social malnutrition r/t pt with reported poor intake PTA and presence of fluid accumulation (bilateral lower extremity edema)   RELATED TO: dry mouth, food preferences  AS EVIDENCE BY: pt  statement  MONITORING/EVALUATION(Goals): Pt to consume >90% of meals  EDUCATION NEEDS: -Education needs addressed - educated pt on nutrition therapy for dry mouth, encouraged use of hard candies to stimulate saliva secretion and continuing sipping on water as needed.   INTERVENTION: Encouraged increased intake. Encouraged pt to try different things on the menu and eat well to promote wound healing. Will monitor.   Dietitian # [phone]  DOCUMENTATION CODES Per approved criteria  -Morbid Obesity    Marshall Cork 01/18/2011, 3:17 PM

## 2011-01-18 NOTE — Progress Notes (Signed)
Nursing- positive for MRSA text sent to Dr Waymon Amato to inform of results.

## 2011-01-18 NOTE — Consults (Signed)
[name] is an 65 y.o. female.   Primary Care ZO:XWRUE Selena Batten Requesting Physician: Dr. 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 She's had multiple admissions for MRSA cellulitis complicated by her psoriasis and morbid obesity. She was transferred from the skilled nursing facility to the ER for evaluation and inpatient treatment of these new fluid collection which are on her right lateral abdomen going to her back. This particular area it is approximately 12 x 8 in. She has a second area on her right lateral thigh anterior which is approximately 5 x 7". She says these draining a green colored fluid. The hospitalist as this is malodorous. She has a another collection under her left axilla which has drained in the past and gotten smaller. We're asked to see in consultation for possible abscess formations at these sites.  Past Medical History  Diagnosis Date  . MRSA cellulitis   . Psoriasis   . Obesities, morbid   . Sjogren's disease   . UTI (urinary tract infection)   . Antiphospholipid syndrome   . Diverticulosis   . Anxiety   . Depression   . Asthma   . Ventral hernia   . Diabetes mellitus   . Myasthenia gravis   . Myasthenia gravis   . Myasthenia gravis   . Back pain, chronic     missing some vertebra per pt  . Complication of anesthesia     can't have muscle relaxers -my. gravis    Past Surgical History  Procedure Date  . Abdominal hysterectomy   . Pilonidal cyst excision   . Cholecystectomy   . Appendectomy     History reviewed. No pertinent family history. Social History:  reports that she has quit smoking. She has never used smokeless tobacco. She reports that she does not drink alcohol or use illicit drugs.  Allergies:  Allergies  Allergen  Reactions  . Penicillins Other (See Comments)    Reaction=unknown    Medications Prior to Admission  Medication Dose Route Frequency Provider Last Rate Last Dose  . 0.9 %  sodium chloride infusion  250 mL Intravenous Continuous Peter Le 1 mL/hr at 01/18/11 0103 250 mL at 01/18/11 0103  . acidophilus (RISAQUAD) capsule 1 capsule  1 capsule Oral BID Houston Siren   1 capsule at 01/18/11 0955  . albuterol (PROVENTIL HFA;VENTOLIN HFA) 108 (90 BASE) MCG/ACT inhaler 1-2 puff  1-2 puff Inhalation Q4H PRN Houston Siren      . aspirin EC tablet 81 mg  81 mg Oral Daily Peter Le   81 mg at 01/18/11 0954  . buPROPion (WELLBUTRIN XL) 24 hr tablet 150 mg  150 mg Oral Daily Peter Le   150 mg at 01/18/11 0955  . cholecalciferol (VITAMIN D) tablet 800 Units  800 Units Oral Daily Peter Le   800 Units at 01/18/11 0955  . clindamycin (CLEOCIN) IVPB 600 mg  600 mg Intravenous Once Gerhard Munch, MD   600 mg at 01/17/11 1840  . clindamycin (CLEOCIN) IVPB 600 mg  600 mg Intravenous Q6H Peter Le   600 mg at 01/18/11 1128  . enoxaparin (LOVENOX) injection 40 mg  40 mg Subcutaneous Q24H Peter Le   40 mg at 01/18/11 0841  . fluconazole (DIFLUCAN) tablet 100 mg  100 mg Oral Daily Anand Hongalgi   100  mg at 01/18/11 1121  . FLUoxetine (PROZAC) capsule 10 mg  10 mg Oral Daily Peter Le   10 mg at 01/18/11 0955  . furosemide (LASIX) tablet 20 mg  20 mg Oral BID Peter Le   20 mg at 01/18/11 0954  . glyBURIDE (DIABETA) tablet 5 mg  5 mg Oral Q breakfast Houston Siren   5 mg at 01/18/11 1610  . HYDROmorphone (DILAUDID) injection 1 mg  1 mg Intravenous Once Gerhard Munch, MD   1 mg at 01/17/11 1807  . HYDROmorphone (DILAUDID) injection 2 mg  2 mg Intravenous Q2H PRN Houston Siren   2 mg at 01/18/11 1120  . insulin aspart (novoLOG) injection 0-20 Units  0-20 Units Subcutaneous TID WC Peter Le   11 Units at 01/18/11 1141  . insulin aspart (novoLOG) injection 0-5 Units  0-5 Units Subcutaneous QHS Houston Siren      . insulin aspart (novoLOG) injection  6 Units  6 Units Subcutaneous TID WC Peter Le   6 Units at 01/18/11 1140  . lamoTRIgine (LAMICTAL) tablet 50 mg  50 mg Oral Daily Peter Le   50 mg at 01/18/11 0956  . methadone (DOLOPHINE) tablet 10 mg  10 mg Oral BID Peter Le   10 mg at 01/18/11 0954  . ondansetron (ZOFRAN) injection 4 mg  4 mg Intravenous Once Gerhard Munch, MD   4 mg at 01/17/11 1807  . pantoprazole (PROTONIX) EC tablet 40 mg  40 mg Oral Q1200 Peter Le   40 mg at 01/18/11 1139  . polyvinyl alcohol (LIQUIFILM TEARS) 1.4 % ophthalmic solution 1 drop  1 drop Both Eyes Daily PRN Houston Siren      . sodium chloride (OCEAN) 0.65 % nasal spray 1 spray  1 spray Nasal PRN Houston Siren      . sodium chloride 0.9 % bolus 1,000 mL  1,000 mL Intravenous Once Gerhard Munch, MD   1,000 mL at 01/17/11 1800  . sodium chloride 0.9 % injection 3 mL  3 mL Intravenous Q12H Peter Le   3 mL at 01/18/11 0105  . sodium chloride 0.9 % injection 3 mL  3 mL Intravenous PRN Houston Siren       No current outpatient prescriptions on file as of 01/18/2011.    Review of systems: 10 point review of systems was noncontributory. She is basically bedbound with limited activity and psoriasis and her skin issues are her primary problems at this time.  Blood pressure 112/58, pulse 93, temperature 98.1 F (36.7 C), temperature source Oral, resp. rate 20, height 5\' 8"  (1.727 m), weight 165.4 kg (364 lb 10.3 oz), SpO2 92.00%. Physical exam: The patient is a 65 year old morbidly obese woman in no acute distress. Head: Normocephalic EENT: Within normal limits Neck: No JVD, no thyromegaly, no bruits. Chest wall: She has psoriasis over her entire chest and used and used infections under each breast. She has a smaller fluctuant fluid collection under the left axilla. Chest: Clear to auscultation anteriorly Cardiac: No murmurs rubs or gallops heard. Abdomen: She has a midline abdominal incision with hernia . She has psoriasis which covers her entire body. She has large  panniculus with a yeast infection. She has on the lateral posterior asked that of her abdomen and a fluctuant collection which is approximately 12 x 8" in diameter. Bowel sounds are present abdomen is soft and nontender. GU/rectal: deferred Lower extremities: Positive for some edema and psoriasis appear Neurologic: cranial nerves are grossly, no focal deficit; Psych: Normal  affect  Results for orders placed during the hospital encounter of 01/17/11 (from the past 48 hour(s))  CBC     Status: Abnormal   Collection Time   01/17/11  6:15 PM      Component Value Range Comment   WBC 11.2 (*) 4.0 - 10.5 (K/uL)    RBC 4.75  3.87 - 5.11 (MIL/uL)    Hemoglobin 12.1  12.0 - 15.0 (g/dL)    HCT 40.9  81.1 - 91.4 (%)    MCV 80.6  78.0 - 100.0 (fL)    MCH 25.5 (*) 26.0 - 34.0 (pg)    MCHC 31.6  30.0 - 36.0 (g/dL)    RDW 78.2  95.6 - 21.3 (%)    Platelets 214  150 - 400 (K/uL)   DIFFERENTIAL     Status: Abnormal   Collection Time   01/17/11  6:15 PM      Component Value Range Comment   Neutrophils Relative 81 (*) 43 - 77 (%)    Neutro Abs 9.1 (*) 1.7 - 7.7 (K/uL)    Lymphocytes Relative 13  12 - 46 (%)    Lymphs Abs 1.4  0.7 - 4.0 (K/uL)    Monocytes Relative 5  3 - 12 (%)    Monocytes Absolute 0.5  0.1 - 1.0 (K/uL)    Eosinophils Relative 1  0 - 5 (%)    Eosinophils Absolute 0.1  0.0 - 0.7 (K/uL)    Basophils Relative 0  0 - 1 (%)    Basophils Absolute 0.0  0.0 - 0.1 (K/uL)   COMPREHENSIVE METABOLIC PANEL     Status: Abnormal   Collection Time   01/17/11  6:15 PM      Component Value Range Comment   Sodium 137  135 - 145 (mEq/L)    Potassium 4.0  3.5 - 5.1 (mEq/L)    Chloride 100  96 - 112 (mEq/L)    CO2 31  19 - 32 (mEq/L)    Glucose, Bld 275 (*) 70 - 99 (mg/dL)    BUN 11  6 - 23 (mg/dL)    Creatinine, Ser 0.86  0.50 - 1.10 (mg/dL)    Calcium 57.8  8.4 - 10.5 (mg/dL)    Total Protein 6.7  6.0 - 8.3 (g/dL)    Albumin 2.8 (*) 3.5 - 5.2 (g/dL)    AST 12  0 - 37 (U/L)    ALT 10  0 -  35 (U/L)    Alkaline Phosphatase 140 (*) 39 - 117 (U/L)    Total Bilirubin 0.1 (*) 0.3 - 1.2 (mg/dL)    GFR calc non Af Amer 88 (*) >90 (mL/min)    GFR calc Af Amer >90  >90 (mL/min)   BASIC METABOLIC PANEL     Status: Abnormal   Collection Time   01/18/11  5:15 AM      Component Value Range Comment   Sodium 137  135 - 145 (mEq/L)    Potassium 4.0  3.5 - 5.1 (mEq/L)    Chloride 100  96 - 112 (mEq/L)    CO2 31  19 - 32 (mEq/L)    Glucose, Bld 220 (*) 70 - 99 (mg/dL)    BUN 10  6 - 23 (mg/dL)    Creatinine, Ser 4.69  0.50 - 1.10 (mg/dL)    Calcium 9.8  8.4 - 10.5 (mg/dL)    GFR calc non Af Amer 86 (*) >90 (mL/min)    GFR calc Af Amer >90  >  90 (mL/min)   CBC     Status: Abnormal   Collection Time   01/18/11  5:15 AM      Component Value Range Comment   WBC 9.0  4.0 - 10.5 (K/uL)    RBC 4.61  3.87 - 5.11 (MIL/uL)    Hemoglobin 11.6 (*) 12.0 - 15.0 (g/dL)    HCT 16.1  09.6 - 04.5 (%)    MCV 80.9  78.0 - 100.0 (fL)    MCH 25.2 (*) 26.0 - 34.0 (pg)    MCHC 31.1  30.0 - 36.0 (g/dL)    RDW 40.9  81.1 - 91.4 (%)    Platelets 228  150 - 400 (K/uL)   GLUCOSE, CAPILLARY     Status: Abnormal   Collection Time   01/18/11  7:46 AM      Component Value Range Comment   Glucose-Capillary 186 (*) 70 - 99 (mg/dL)    Comment 1 Notify RN     GLUCOSE, CAPILLARY     Status: Abnormal   Collection Time   01/18/11 11:40 AM      Component Value Range Comment   Glucose-Capillary 279 (*) 70 - 99 (mg/dL)    Comment 1 Notify RN      No results found.     Assessment/Plan Impression: 1. Severe psoriasis and a history of MRSA infections. 2. 2 large fluctuant fluid collection right side, and one on the left 3. Morbid Obesity 4.Requires SNF care Patient Active Problem List  Diagnoses  . Psoriasis (a type of skin inflammation)  . Cellulitis and abscess of buttock  . Diabetes mellitus  . Obesity (BMI 30-39.9)  . Myasthenia gravis  . Antiphospholipid syndrome   plan: I will discuss with Dr.  Daphine Deutscher, consider aspiration and sent for culture and analysis. This is will [facility] physician assistant for Dr. Luretha Murphy. Shavon Ashmore 01/18/2011, 12:38 PM   Ja

## 2011-01-18 NOTE — Progress Notes (Signed)
Morbidly obese 65yo WF with hx Type II DM admitted with cellulitis.  On glyburide 5 mg QD at home.  HgbA1C 8.8.  Needs better glucose control for healing.  Probably needs to go home on insulin.  See Glycemic Control note.

## 2011-01-18 NOTE — Progress Notes (Signed)
Subjective: Patient has multiple complaints. She complains of pain all over. She says she has seen a dermatologist for her psoriasis but has not been receiving medications for the same as per instructions at the nursing facility. She complains of fungal infection underneath her breasts and groin folds and between her thighs. She complains of increasing swelling and bogginess right upper lateral thigh with drainage.  Objective: Blood pressure 112/58, pulse 93, temperature 98.1 F (36.7 C), temperature source Oral, resp. rate 20, height 5\' 8"  (1.727 m), weight 165.4 kg (364 lb 10.3 oz), SpO2 92.00%.  Intake/Output Summary (Last 24 hours) at 01/18/11 1050 Last data filed at 01/18/11 0945  Gross per 24 hour  Intake    680 ml  Output      0 ml  Net    680 ml   Patient was examined with her nurse and the IV nurse in the room. General exam: Moderately obese but comfortable and in no obvious distress. Respiratory system: Reduced breath sounds in the bases but rest of lung fields are clear. No increased work of breathing. Cardiovascular system: First and second heart sounds heard regular. No JVD or murmurs Gastrointestinal system: Abdomen is obese. Soft and normal bowel sounds heard. Central nervous system: Alert and oriented. No focal neurological deficits Skin: #1. Patient has extensive lesions of psoriasis all over her body. #2. Extensive area of erythematous, satellite lesions with some of 80 is an oozing underneath bilateral breasts, groin and between her thighs with left being worse than the right. This is highly suggestive of fungal infection. #3. Patient has extensive redundant skinfold of her abdomen. There is a large AV of erythema, scaling, tenderness on one such right lateral abdominal skin fold. There is no fluctuation. No drainage in this area. There is an area of swelling, redness, tenderness and possible fluctuation on the right upper lateral thigh. There is some drainage which seems to  have a foul order. This area is large.  Lab Results: Basic Metabolic Panel:  Basename 01/18/11 0515 01/17/11 1815  NA 137 137  K 4.0 4.0  CL 100 100  CO2 31 31  GLUCOSE 220* 275*  BUN 10 11  CREATININE 0.78 0.72  CALCIUM 9.8 10.2  MG -- --  PHOS -- --   Liver Function Tests:  Basename 01/17/11 1815  AST 12  ALT 10  ALKPHOS 140*  BILITOT 0.1*  PROT 6.7  ALBUMIN 2.8*   No results found for this basename: LIPASE:2,AMYLASE:2 in the last 72 hours No results found for this basename: AMMONIA:2 in the last 72 hours CBC:  Basename 01/18/11 0515 01/17/11 1815  WBC 9.0 11.2*  NEUTROABS -- 9.1*  HGB 11.6* 12.1  HCT 37.3 38.3  MCV 80.9 80.6  PLT 228 214   Cardiac Enzymes: No results found for this basename: CKTOTAL:3,CKMB:3,CKMBINDEX:3,TROPONINI:3 in the last 72 hours BNP: No results found for this basename: POCBNP:3 in the last 72 hours D-Dimer: No results found for this basename: DDIMER:2 in the last 72 hours CBG:  Basename 01/18/11 0746  GLUCAP 186*   Hemoglobin A1C: No results found for this basename: HGBA1C in the last 72 hours Fasting Lipid Panel: No results found for this basename: CHOL,HDL,LDLCALC,TRIG,CHOLHDL,LDLDIRECT in the last 72 hours Thyroid Function Tests: No results found for this basename: TSH,T4TOTAL,FREET4,T3FREE,THYROIDAB in the last 72 hours Anemia Panel: No results found for this basename: VITAMINB12,FOLATE,FERRITIN,TIBC,IRON,RETICCTPCT in the last 72 hours Coagulation: No results found for this basename: LABPROT:2,INR:2 in the last 72 hours Urine Drug Screen:  Alcohol  Level: No results found for this basename: ETH:2 in the last 72 hours Urinalysis:  Misc. Labs:   Micro Results: No results found for this or any previous visit (from the past 240 hour(s)).  Studies/Results: No results found.  Medications: Scheduled Meds:   . acidophilus  1 capsule Oral BID  . aspirin EC  81 mg Oral Daily  . buPROPion  150 mg Oral Daily  .  cholecalciferol  800 Units Oral Daily  . clindamycin (CLEOCIN) IV  600 mg Intravenous Once  . clindamycin (CLEOCIN) IV  600 mg Intravenous Q6H  . enoxaparin  40 mg Subcutaneous Q24H  . fluconazole  100 mg Oral Daily  . FLUoxetine  10 mg Oral Daily  . furosemide  20 mg Oral BID  . glyBURIDE  5 mg Oral Q breakfast  .  HYDROmorphone (DILAUDID) injection  1 mg Intravenous Once  . insulin aspart  0-20 Units Subcutaneous TID WC  . insulin aspart  0-5 Units Subcutaneous QHS  . insulin aspart  6 Units Subcutaneous TID WC  . lamoTRIgine  50 mg Oral Daily  . methadone  10 mg Oral BID  . ondansetron  4 mg Intravenous Once  . pantoprazole  40 mg Oral Q1200  . sodium chloride  1,000 mL Intravenous Once  . sodium chloride  3 mL Intravenous Q12H   Continuous Infusions:   . sodium chloride 250 mL (01/18/11 0103)   PRN Meds:.albuterol, HYDROmorphone, polyvinyl alcohol, sodium chloride, sodium chloride  Assessment/Plan:  1. Cellulitis versus infected Psoriatic lesions of right lateral abdominal skin folds and right thigh. Rule out abscess in the right thigh. 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 Previous wound culture grew MRSA which was sensitive to clindamycin. Continue intravenous clindamycin. Surgical consultation has been requested. Wound care nursing consultation is pending. 2. Extensive fungal infection of skin under the breasts and groin: We'll treat with oral fluconazole and monitor. 3. Extensive psoriasis: We'll need to consult and follow up with dermatology as an outpatient. 4. Uncontrolled type 2 diabetes mellitus. Continue sliding scale insulin and oral meds and monitor. 5. Morbid obesity.      [name] 01/18/2011, 10:50 AM

## 2011-01-18 NOTE — Progress Notes (Signed)
UR completed 

## 2011-01-19 LAB — GLUCOSE, CAPILLARY
Glucose-Capillary: 185 mg/dL — ABNORMAL HIGH (ref 70–99)
Glucose-Capillary: 198 mg/dL — ABNORMAL HIGH (ref 70–99)

## 2011-01-19 MED ORDER — INSULIN GLARGINE 100 UNIT/ML ~~LOC~~ SOLN
5.0000 [IU] | Freq: Every day | SUBCUTANEOUS | Status: DC
  Administered 2011-01-19 – 2011-01-21 (×3): 5 [IU] via SUBCUTANEOUS
  Filled 2011-01-19: qty 3

## 2011-01-19 NOTE — Progress Notes (Signed)
     Subjective: [name], clear drainage from right flank and r thigh  Objective: Vital signs in last 24 hours: Temp:  [98.2 F (36.8 C)-98.8 F (37.1 C)] 98.7 F (37.1 C) (11/17 0619) Pulse Rate:  [85-99] 99  (11/17 0619) Resp:  [18-20] 20  (11/17 0619) BP: (119-144)/(50-60) 142/50 mmHg (11/17 0619) SpO2:  [91 %-94 %] 93 % (11/17 0619) Last BM Date: 01/17/11  Intake/Output from previous day: 11/16 0701 - 11/17 0700 In: 580 [P.O.:480; IV Piggyback:100] Out: -  Intake/Output this shift:    Abd/Ext/Skin: Severe psoriatic rash. Apparent deep SQ fluid pockets R pannus and R thigh.  Mild cellulitis  Lab Results:   [facility] 01/18/11 0515 01/17/11 1815  WBC 9.0 11.2*  HGB 11.6* 12.1  HCT 37.3 38.3  PLT 228 214   BMET  Basename 01/18/11 0515 01/17/11 1815  NA 137 137  K 4.0 4.0  CL 100 100  CO2 31 31  GLUCOSE 220* 275*  BUN 10 11  CREATININE 0.78 0.72  CALCIUM 9.8 10.2   PT/INR No results found for this basename: LABPROT:2,INR:2 in the last 72 hours ABG No results found for this basename: PHART:2,PCO2:2,PO2:2,HCO3:2 in the last 72 hours  Studies/Results: No results found.  Anti-infectives: Anti-infectives     Start     Dose/Rate Route Frequency Ordered Stop   01/18/11 1130   fluconazole (DIFLUCAN) tablet 100 mg        100 mg Oral Daily 01/18/11 1050     01/18/11 0015   clindamycin (CLEOCIN) IVPB 600 mg        600 mg 100 mL/hr over 30 Minutes Intravenous 4 times per day 01/18/11 0001     01/17/11 1800   clindamycin (CLEOCIN) IVPB 600 mg        600 mg 100 mL/hr over 30 Minutes Intravenous  Once 01/17/11 1735 01/17/11 1910          Assessment/Plan: I suspect fluid collections R pannus and thigh represent seromas rather than frank abscesses.  They may be infected.  I would recommend US guided aspiration of drainage on Monday    LOS: 2 days    [name] T 01/19/2011  kj

## 2011-01-19 NOTE — Progress Notes (Signed)
Subjective: Patient says he is feeling better. Pain on the right side is controlled. Oozing from the right thigh is better.  Objective: Blood pressure 142/50, pulse 99, temperature 98.7 F (37.1 C), temperature source Oral, resp. rate 20, height 5\' 8"  (1.727 m), weight 165.4 kg (364 lb 10.3 oz), SpO2 93.00%.  Intake/Output Summary (Last 24 hours) at 01/19/11 1638 Last data filed at 01/19/11 1300  Gross per 24 hour  Intake    480 ml  Output      0 ml  Net    480 ml   General exam: Moderately obese but comfortable and in no obvious distress. Respiratory system: Reduced breath sounds in the bases but rest of lung fields are clear. No increased work of breathing. Cardiovascular system: First and second heart sounds heard regular. No JVD or murmurs Gastrointestinal system: Abdomen is obese. Soft and normal bowel sounds heard. Central nervous system: Alert and oriented. No focal neurological deficits Skin: #1. Patient has extensive lesions of psoriasis all over her body. #2. Extensive area of erythematous, satellite lesions with some oozing underneath bilateral breasts, groin and between her thighs with left being worse than the right. This is highly suggestive of fungal infection. #3. Patient has extensive redundant skinfold of her abdomen. There is a large AV of erythema, scaling, tenderness on one such right lateral abdominal skin fold. There is no fluctuation. No drainage in this area. There is an area of swelling, redness, tenderness and possible fluctuation on the right upper lateral thigh. There is decrease in erythema and oozing. The lesions overall look slightly better than yesterday.  Lab Results: Basic Metabolic Panel:  Basename 01/18/11 0515 01/17/11 1815  NA 137 137  K 4.0 4.0  CL 100 100  CO2 31 31  GLUCOSE 220* 275*  BUN 10 11  CREATININE 0.78 0.72  CALCIUM 9.8 10.2  MG -- --  PHOS -- --   Liver Function Tests:  Basename 01/17/11 1815  AST 12  ALT 10  ALKPHOS 140*    BILITOT 0.1*  PROT 6.7  ALBUMIN 2.8*   No results found for this basename: LIPASE:2,AMYLASE:2 in the last 72 hours No results found for this basename: AMMONIA:2 in the last 72 hours CBC:  Basename 01/18/11 0515 01/17/11 1815  WBC 9.0 11.2*  NEUTROABS -- 9.1*  HGB 11.6* 12.1  HCT 37.3 38.3  MCV 80.9 80.6  PLT 228 214   Cardiac Enzymes: No results found for this basename: CKTOTAL:3,CKMB:3,CKMBINDEX:3,TROPONINI:3 in the last 72 hours BNP: No results found for this basename: POCBNP:3 in the last 72 hours D-Dimer: No results found for this basename: DDIMER:2 in the last 72 hours CBG:  Basename 01/19/11 1122 01/19/11 0737 01/18/11 2143 01/18/11 1827 01/18/11 1140 01/18/11 0746  GLUCAP 218* 185* 163* 189* 279* 186*   Hemoglobin A1C:  Basename 01/18/11 0515  HGBA1C 8.8*   Fasting Lipid Panel: No results found for this basename: CHOL,HDL,LDLCALC,TRIG,CHOLHDL,LDLDIRECT in the last 72 hours Thyroid Function Tests: No results found for this basename: TSH,T4TOTAL,FREET4,T3FREE,THYROIDAB in the last 72 hours Anemia Panel: No results found for this basename: VITAMINB12,FOLATE,FERRITIN,TIBC,IRON,RETICCTPCT in the last 72 hours Coagulation: No results found for this basename: LABPROT:2,INR:2 in the last 72 hours Urine Drug Screen:  Alcohol Level: No results found for this basename: ETH:2 in the last 72 hours Urinalysis:  Misc. Labs:   Micro Results: Recent Results (from the past 240 hour(s))  MRSA PCR SCREENING     Status: Abnormal   Collection Time   01/18/11  2:22 PM  Component Value Range Status Comment   MRSA by PCR POSITIVE (*) NEGATIVE  Final     Studies/Results: No results found.  Medications: Scheduled Meds:    . acidophilus  1 capsule Oral BID  . aspirin EC  81 mg Oral Daily  . buPROPion  150 mg Oral Daily  . cholecalciferol  800 Units Oral Daily  . clindamycin (CLEOCIN) IV  600 mg Intravenous Q6H  . enoxaparin  40 mg Subcutaneous Q24H  .  fluconazole  100 mg Oral Daily  . FLUoxetine  10 mg Oral Daily  . furosemide  20 mg Oral BID  . glyBURIDE  5 mg Oral Q breakfast  . insulin aspart  0-20 Units Subcutaneous TID WC  . insulin aspart  0-5 Units Subcutaneous QHS  . insulin aspart  6 Units Subcutaneous TID WC  . lamoTRIgine  50 mg Oral Daily  . methadone  10 mg Oral BID  . pantoprazole  40 mg Oral Q1200  . sodium chloride  3 mL Intravenous Q12H   Continuous Infusions:    . sodium chloride 250 mL (01/18/11 2146)   PRN Meds:.albuterol, HYDROmorphone, polyvinyl alcohol, sodium chloride, sodium chloride  Assessment/Plan:  1. Cellulitis versus infected Psoriatic lesions of right lateral abdominal skin folds and right thigh. Surgeon's have consulted and suggest that the fluid collections in the right pannus and thigh represent seromas rather than frank abscesses. They recommend ultrasound-guided aspiration on Monday. 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 Previous wound culture grew MRSA which was sensitive to clindamycin. Continue intravenous clindamycin.Wound care nursing consultation is pending. 2. Extensive fungal infection of skin under the breasts and groin: We'll treat with oral fluconazole and monitor. 3. Extensive psoriasis: We'll need to consult and follow up with dermatology as an outpatient. 4. Uncontrolled type 2 diabetes mellitus. Continue sliding scale insulin and oral meds and monitor. Will add low-dose Lantus at bedtime. 5. Morbid obesity.      [name] 01/19/2011, 4:38 PM

## 2011-01-19 NOTE — Progress Notes (Signed)
Attempted see Pt.  Unable to awaken Pt.  Will attempt to f/u with Pt tomorrow.  Providence Crosby, LCSWA Clinical Social Work [phone]

## 2011-01-20 LAB — GLUCOSE, CAPILLARY
Glucose-Capillary: 86 mg/dL (ref 70–99)
Glucose-Capillary: 190 mg/dL — ABNORMAL HIGH (ref 70–99)

## 2011-01-20 MED ORDER — HYDROMORPHONE HCL PF 2 MG/ML IJ SOLN
1.0000 mg | INTRAMUSCULAR | Status: DC | PRN
  Administered 2011-01-20 – 2011-01-22 (×9): 2 mg via INTRAVENOUS
  Filled 2011-01-20 (×9): qty 1

## 2011-01-20 NOTE — Progress Notes (Signed)
  Subjective: Alert and afebrile. No real change in the symptomatic fluid collections in her right pannus and right thigh. Her biggest concern is that she is going to lose her bed at the nursing home if we don't send her back immediately.  Objective: Vital signs in last 24 hours: Temp:  [98.3 F (36.8 C)-98.4 F (36.9 C)] 98.3 F (36.8 C) (11/18 0621) Pulse Rate:  [90] 90  (11/17 1430) Resp:  [20] 20  (11/17 1430) BP: (140)/(73) 140/73 mmHg (11/17 1430) SpO2:  [90 %] 90 % (11/17 1430) Weight:  [364 lb 10.3 oz (165.4 kg)] 364 lb 10.3 oz (165.4 kg) (11/18 0621) Last BM Date: 01/17/11  Intake/Output from previous day: 11/17 0701 - 11/18 0700 In: 1252 [P.O.:480; I.V.:622; IV Piggyback:150] Out: -  Intake/Output this shift:    GI: morbidly obese. Huge pannus. Chronic skin rash. Palpable flow of large fluid collection in the right lateral pannus and the right lateral thigh.  Lab Results:   [facility] 01/18/11 0515 01/17/11 1815  WBC 9.0 11.2*  HGB 11.6* 12.1  HCT 37.3 38.3  PLT 228 214   BMET  Basename 01/18/11 0515 01/17/11 1815  NA 137 137  K 4.0 4.0  CL 100 100  CO2 31 31  GLUCOSE 220* 275*  BUN 10 11  CREATININE 0.78 0.72  CALCIUM 9.8 10.2   PT/INR No results found for this basename: LABPROT:2,INR:2 in the last 72 hours ABG No results found for this basename: PHART:2,PCO2:2,PO2:2,HCO3:2 in the last 72 hours  Studies/Results: No results found.  Anti-infectives: Anti-infectives     Start     Dose/Rate Route Frequency Ordered Stop   01/18/11 1130   fluconazole (DIFLUCAN) tablet 100 mg        100 mg Oral Daily 01/18/11 1050     01/18/11 0015   clindamycin (CLEOCIN) IVPB 600 mg        600 mg 100 mL/hr over 30 Minutes Intravenous 4 times per day 01/18/11 0001     01/17/11 1800   clindamycin (CLEOCIN) IVPB 600 mg        600 mg 100 mL/hr over 30 Minutes Intravenous  Once 01/17/11 1735 01/17/11 1910          Assessment/Plan:  I advise that the patient be  sent to radiology tomorrow morning for ultrasound guided drainage of the fluid collections in the right pannus and right lateral thigh.  I would ask radiology to put the largest drain possible in.  Fluids should be sent for culture.  I would avoid open drainage of these areas because of anticipated poor tissue integrity and poor wound healing.  LOS: 3 days    Evalee Gerard M 01/20/2011

## 2011-01-20 NOTE — Progress Notes (Signed)
Subjective: Improving skin lesions and decreasing pain on the right side.  Objective: Blood pressure 140/73, pulse 90, temperature 98.3 F (36.8 C), temperature source Oral, resp. rate 20, height 5\' 8"  (1.727 m), weight 165.4 kg (364 lb 10.3 oz), SpO2 90.00%.  Intake/Output Summary (Last 24 hours) at 01/20/11 1806 Last data filed at 01/20/11 0900  Gross per 24 hour  Intake    483 ml  Output      0 ml  Net    483 ml   General exam: comfortable. Respiratory system: clear. No increased work of breathing. Cardiovascular system: First and second heart sounds heard regular. No JVD or murmurs Gastrointestinal system: Abdomen is obese. Soft and normal bowel sounds heard. Central nervous system: Alert and oriented. No focal neurological deficits Skin: Improvement in all the skin lesions with reduction redness, wheezing.  Lab Results: Basic Metabolic Panel:  Basename 01/18/11 0515 01/17/11 1815  NA 137 137  K 4.0 4.0  CL 100 100  CO2 31 31  GLUCOSE 220* 275*  BUN 10 11  CREATININE 0.78 0.72  CALCIUM 9.8 10.2  MG -- --  PHOS -- --   Liver Function Tests:  Basename 01/17/11 1815  AST 12  ALT 10  ALKPHOS 140*  BILITOT 0.1*  PROT 6.7  ALBUMIN 2.8*   No results found for this basename: LIPASE:2,AMYLASE:2 in the last 72 hours No results found for this basename: AMMONIA:2 in the last 72 hours CBC:  Basename 01/18/11 0515 01/17/11 1815  WBC 9.0 11.2*  NEUTROABS -- 9.1*  HGB 11.6* 12.1  HCT 37.3 38.3  MCV 80.9 80.6  PLT 228 214   Cardiac Enzymes: No results found for this basename: CKTOTAL:3,CKMB:3,CKMBINDEX:3,TROPONINI:3 in the last 72 hours BNP: No results found for this basename: POCBNP:3 in the last 72 hours D-Dimer: No results found for this basename: DDIMER:2 in the last 72 hours CBG:  Basename 01/20/11 1643 01/20/11 1137 01/20/11 0756 01/19/11 2129 01/19/11 1706 01/19/11 1122  GLUCAP 190* 86 165* 208* 198* 218*   Hemoglobin A1C:  Basename 01/18/11 0515    HGBA1C 8.8*   Fasting Lipid Panel: No results found for this basename: CHOL,HDL,LDLCALC,TRIG,CHOLHDL,LDLDIRECT in the last 72 hours Thyroid Function Tests: No results found for this basename: TSH,T4TOTAL,FREET4,T3FREE,THYROIDAB in the last 72 hours Anemia Panel: No results found for this basename: VITAMINB12,FOLATE,FERRITIN,TIBC,IRON,RETICCTPCT in the last 72 hours Coagulation: No results found for this basename: LABPROT:2,INR:2 in the last 72 hours Urine Drug Screen:  Alcohol Level: No results found for this basename: ETH:2 in the last 72 hours Urinalysis:  Misc. Labs:   Micro Results: Recent Results (from the past 240 hour(s))  MRSA PCR SCREENING     Status: Abnormal   Collection Time   01/18/11  2:22 PM      Component Value Range Status Comment   MRSA by PCR POSITIVE (*) NEGATIVE  Final     Studies/Results: No results found.  Medications: Scheduled Meds:    . acidophilus  1 capsule Oral BID  . aspirin EC  81 mg Oral Daily  . buPROPion  150 mg Oral Daily  . cholecalciferol  800 Units Oral Daily  . clindamycin (CLEOCIN) IV  600 mg Intravenous Q6H  . enoxaparin  40 mg Subcutaneous Q24H  . fluconazole  100 mg Oral Daily  . FLUoxetine  10 mg Oral Daily  . furosemide  20 mg Oral BID  . glyBURIDE  5 mg Oral Q breakfast  . insulin aspart  0-20 Units Subcutaneous TID WC  .  insulin aspart  0-5 Units Subcutaneous QHS  . insulin aspart  6 Units Subcutaneous TID WC  . insulin glargine  5 Units Subcutaneous QHS  . lamoTRIgine  50 mg Oral Daily  . methadone  10 mg Oral BID  . pantoprazole  40 mg Oral Q1200  . sodium chloride  3 mL Intravenous Q12H   Continuous Infusions:    . sodium chloride 250 mL (01/20/11 1507)   PRN Meds:.albuterol, HYDROmorphone, polyvinyl alcohol, sodium chloride, sodium chloride  Assessment/Plan:  1. Cellulitis versus infected Psoriatic lesions of right lateral abdominal skin folds and right thigh. Surgeon's have consulted and suggest that  the fluid collections in the right pannus and thigh represent seromas rather than frank abscesses. They recommend ultrasound-guided aspiration on Monday. 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 Previous wound culture grew MRSA which was sensitive to clindamycin. Continue intravenous clindamycin.Wound care nursing consultation is pending. 2. Extensive fungal infection of skin under the breasts and groin: We'll treat with oral fluconazole and monitor. 3. Extensive psoriasis: We'll need to consult and follow up with dermatology as an outpatient. 4. Uncontrolled type 2 diabetes mellitus. Continue sliding scale insulin and oral meds and monitor. Will add low-dose Lantus at bedtime. 5. Morbid obesity.  We'll request ultrasound-guided aspiration of fluid collections in the right abdominal pannus and right lateral thigh.    [name] 01/20/2011, 6:06 PM

## 2011-01-21 ENCOUNTER — Inpatient Hospital Stay (HOSPITAL_COMMUNITY): Payer: Medicare Other

## 2011-01-21 LAB — GLUCOSE, CAPILLARY
Glucose-Capillary: 159 mg/dL — ABNORMAL HIGH (ref 70–99)
Glucose-Capillary: 230 mg/dL — ABNORMAL HIGH (ref 70–99)

## 2011-01-21 MED ORDER — CHLORHEXIDINE GLUCONATE CLOTH 2 % EX PADS
6.0000 | MEDICATED_PAD | Freq: Every day | CUTANEOUS | Status: DC
  Administered 2011-01-21: 6 via TOPICAL

## 2011-01-21 MED ORDER — MUPIROCIN 2 % EX OINT
1.0000 "application " | TOPICAL_OINTMENT | Freq: Two times a day (BID) | CUTANEOUS | Status: DC
  Administered 2011-01-21 – 2011-01-22 (×3): 1 via NASAL
  Filled 2011-01-21: qty 22

## 2011-01-21 NOTE — H&P (Signed)
  NAME:  ISLAH, EVE NO.:  [id number]  MEDICAL RECORD NO.:  [id number]          PATIENT TYPE:  REC  LOCATION:  FOOT                         FACILITY:  [facility]  PHYSICIAN:  Ardath Sax, M.D.     DATE OF BIRTH:  11-Jun-1945  DATE OF ADMISSION: DATE OF DISCHARGE:                             HISTORY & PHYSICAL   This is a morbidly obese 65 year old lady from a nursing home who has many problems including myasthenia gravis type 2 diabetes, Sjogren syndrome, and she has chronic problems with dryness and then secondary cellulitis, especially of her right upper leg.  She came here today because she has some redness and dryness and I just thought she needed to use Eucerin moisturizers and if she had any further problems, probably to see a dermatologist as she might need antibiotics from time to time.  She is on many medicines including she rubs on cortisone cream on her legs.  She has also been on vancomycin until recently.  She takes inhalers for COPD.  She is also on Lasix, potassium, glyburide, RisaQuad, and I looked her over pretty carefully and I felt like we did not need to do any followup here as she does not have any cellulitis at this time.  She will come back if that ever occurs and I encouraged her to keep her skin moist, to take frequent showers, and to see a dermatologist if she has any problems.     Ardath Sax, M.D.     PP/MEDQ  D:  02/07/2010  T:  02/08/2010  Job:  [id number]  Electronically Signed by Ardath Sax  on 01/21/2011 01:35:13 PM

## 2011-01-21 NOTE — Plan of Care (Signed)
Problem: Phase I Progression Outcomes Goal: Initial discharge plan identified Outcome: Progressing Pt plans to be d/c'd to SNF at [facility]

## 2011-01-21 NOTE — Progress Notes (Signed)
  Patient seen for IR evaluation for possible drainage of right thigh and right pannus collection.  No recent imaging.  Right thigh and pannus with significant psoriasis, erythema and tenderness with small area of skin breakdown to posterior thigh.  No recent imaging present.  Patient does not want drainage procedure - states has never had before and is concerned with sepsis and further risk of infection with foreign body being present and her bed-bound status.  Patient agreeable after lengthy conversation to at least allow imaging - if areas seen to drain - may just aspirate to send off for culture analysis which would minimize further infection risks.  Patient verbalized her agreement with this plan.  [facility]  PA made aware of our plan.

## 2011-01-21 NOTE — Progress Notes (Signed)
Subjective: IR in room and pt hesitant to have a drainage done.  She says she's had these before and no drainage was needed.  She has also eaten, so Radiology will plan to to an ultrasound and the discuss with Dr. Waymon Amato & Derrell Lolling.  Objective: Vital signs in last 24 hours: Temp:  [98.3 F (36.8 C)-98.4 F (36.9 C)] 98.3 F (36.8 C) (11/19 0550) Pulse Rate:  [82-86] 82  (11/19 0550) Resp:  [18] 18  (11/19 0550) SpO2:  [93 %-96 %] 93 % (11/19 0550) Last BM Date: 01/20/11  Intake/Output from previous day: 11/18 0701 - 11/19 0700 In: 1169.2 [P.O.:480; I.V.:689.2] Out: -  Intake/Output this shift:      Lab Results:  No results found for this basename: WBC:2,HGB:2,HCT:2,PLT:2 in the last 72 hours  BMET No results found for this basename: NA:2,K:2,CL:2,CO2:2,GLUCOSE:2,BUN:2,CREATININE:2,CALCIUM:2 in the last 72 hours PT/INR No results found for this basename: LABPROT:2,INR:2 in the last 72 hours   Studies/Results: No results found.  Anti-infectives: Anti-infectives     Start     Dose/Rate Route Frequency Ordered Stop   01/18/11 1130   fluconazole (DIFLUCAN) tablet 100 mg        100 mg Oral Daily 01/18/11 1050     01/18/11 0015   clindamycin (CLEOCIN) IVPB 600 mg        600 mg 100 mL/hr over 30 Minutes Intravenous 4 times per day 01/18/11 0001     01/17/11 1800   clindamycin (CLEOCIN) IVPB 600 mg        600 mg 100 mL/hr over 30 Minutes Intravenous  Once 01/17/11 1735 01/17/11 1910         Current Facility-Administered Medications  Medication Dose Route Frequency Provider Last Rate Last Dose  . 0.9 %  sodium chloride infusion  250 mL Intravenous Continuous Peter Le 1 mL/hr at 01/20/11 1507 250 mL at 01/20/11 1507  . acidophilus (RISAQUAD) capsule 1 capsule  1 capsule Oral BID Houston Siren   1 capsule at 01/21/11 1057  . albuterol (PROVENTIL HFA;VENTOLIN HFA) 108 (90 BASE) MCG/ACT inhaler 1-2 puff  1-2 puff Inhalation Q4H PRN Houston Siren      . aspirin EC tablet 81 mg   81 mg Oral Daily Peter Le   81 mg at 01/21/11 1057  . buPROPion (WELLBUTRIN XL) 24 hr tablet 150 mg  150 mg Oral Daily Peter Le   150 mg at 01/21/11 1057  . Chlorhexidine Gluconate Cloth 2 % PADS 6 each  6 each Topical Q0600 Anand Hongalgi      . cholecalciferol (VITAMIN D) tablet 800 Units  800 Units Oral Daily Peter Le   800 Units at 01/21/11 1056  . clindamycin (CLEOCIN) IVPB 600 mg  600 mg Intravenous Q6H Peter Le   600 mg at 01/21/11 0539  . enoxaparin (LOVENOX) injection 40 mg  40 mg Subcutaneous Q24H Peter Le   40 mg at 01/21/11 0818  . fluconazole (DIFLUCAN) tablet 100 mg  100 mg Oral Daily Anand Hongalgi   100 mg at 01/21/11 1057  . FLUoxetine (PROZAC) capsule 10 mg  10 mg Oral Daily Peter Le   10 mg at 01/21/11 1057  . furosemide (LASIX) tablet 20 mg  20 mg Oral BID Peter Le   20 mg at 01/21/11 1057  . glyBURIDE (DIABETA) tablet 5 mg  5 mg Oral Q breakfast Houston Siren   5 mg at 01/21/11 0819  . HYDROmorphone (DILAUDID) injection 1-2 mg  1-2 mg Intravenous Q4H PRN Theadora Rama  Hongalgi   2 mg at 01/21/11 0833  . insulin aspart (novoLOG) injection 0-20 Units  0-20 Units Subcutaneous TID WC Peter Le   4 Units at 01/20/11 1803  . insulin aspart (novoLOG) injection 0-5 Units  0-5 Units Subcutaneous QHS Houston Siren   2 Units at 01/19/11 2242  . insulin aspart (novoLOG) injection 6 Units  6 Units Subcutaneous TID WC Peter Le   6 Units at 01/21/11 0819  . insulin glargine (LANTUS) injection 5 Units  5 Units Subcutaneous QHS Anand Hongalgi   5 Units at 01/20/11 2215  . lamoTRIgine (LAMICTAL) tablet 50 mg  50 mg Oral Daily Peter Le   50 mg at 01/21/11 1056  . methadone (DOLOPHINE) tablet 10 mg  10 mg Oral BID Peter Le   10 mg at 01/21/11 1057  . mupirocin ointment (BACTROBAN) 2 % 1 application  1 application Nasal BID Anand Hongalgi      . pantoprazole (PROTONIX) EC tablet 40 mg  40 mg Oral Q1200 Peter Le   40 mg at 01/20/11 1231  . polyvinyl alcohol (LIQUIFILM TEARS) 1.4 % ophthalmic solution 1 drop  1 drop  Both Eyes Daily PRN Houston Siren   1 drop at 01/19/11 1156  . sodium chloride (OCEAN) 0.65 % nasal spray 1 spray  1 spray Nasal PRN Houston Siren   1 spray at 01/19/11 1155  . sodium chloride 0.9 % injection 3 mL  3 mL Intravenous Q12H Peter Le   3 mL at 01/20/11 2221  . sodium chloride 0.9 % injection 3 mL  3 mL Intravenous PRN Peter Le   3 mL at 01/18/11 2146  . DISCONTD: HYDROmorphone (DILAUDID) injection 2 mg  2 mg Intravenous Q2H PRN Houston Siren   2 mg at 01/20/11 1427    Assessment/Plan  Cellulitis with large fluid collections, right thigh and lateral abdomen. Patient Active Problem List  Diagnoses  . Psoriasis (a type of skin inflammation)  . Cellulitis and abscess of buttock  . Diabetes mellitus  . Obesity (BMI 30-39.9)  . Myasthenia gravis  . Antiphospholipid syndrome  Pt is agreeable to [place] TODAY, discuss options after [place], will need to be NPO before an attempt to drain.  LOS: 4 days    [name],[name] 01/21/2011  Patient seen. Very concerned that indwelling drain will cause secondary infection, which is a possibility.  Perhaps performing simple needle aspiration of both areas and awaiting the culture results would address everyone's concerns.  Ernestene Mention 01/21/2011 1:39 PM

## 2011-01-21 NOTE — Progress Notes (Signed)
Subjective: Improving skin lesions and decreasing pain on the right side. No new complaints  Objective: Blood pressure 140/73, pulse 82, temperature 98.3 F (36.8 C), temperature source Oral, resp. rate 18, height 5\' 8"  (1.727 m), weight 165.4 kg (364 lb 10.3 oz), SpO2 93.00%.  Intake/Output Summary (Last 24 hours) at 01/21/11 1739 Last data filed at 01/21/11 1400  Gross per 24 hour  Intake 1449.24 ml  Output      0 ml  Net 1449.24 ml   General exam: comfortable. Respiratory system: clear. No increased work of breathing. Cardiovascular system: First and second heart sounds heard regular. No JVD or murmurs Gastrointestinal system: Abdomen is obese. Soft and normal bowel sounds heard. Central nervous system: Alert and oriented. No focal neurological deficits Skin: Improvement in all the skin lesions with reduction redness, swelling on the right abdominal pannus and right lateral thigh persists..  Lab Results: Basic Metabolic Panel: No results found for this basename: NA:2,K:2,CL:2,CO2:2,GLUCOSE:2,BUN:2,CREATININE:2,CALCIUM:2,MG:2,PHOS:2 in the last 72 hours Liver Function Tests: No results found for this basename: AST:2,ALT:2,ALKPHOS:2,BILITOT:2,PROT:2,ALBUMIN:2 in the last 72 hours No results found for this basename: LIPASE:2,AMYLASE:2 in the last 72 hours No results found for this basename: AMMONIA:2 in the last 72 hours CBC: No results found for this basename: WBC:2,NEUTROABS:2,HGB:2,HCT:2,MCV:2,PLT:2 in the last 72 hours Cardiac Enzymes: No results found for this basename: CKTOTAL:3,CKMB:3,CKMBINDEX:3,TROPONINI:3 in the last 72 hours BNP: No results found for this basename: POCBNP:3 in the last 72 hours D-Dimer: No results found for this basename: DDIMER:2 in the last 72 hours CBG:  Basename 01/21/11 1248 01/20/11 2138 01/20/11 1643 01/20/11 1137 01/20/11 0756 01/19/11 2129  GLUCAP 111* 153* 190* 86 165* 208*   Hemoglobin A1C: No results found for this basename: HGBA1C in  the last 72 hours Fasting Lipid Panel: No results found for this basename: CHOL,HDL,LDLCALC,TRIG,CHOLHDL,LDLDIRECT in the last 72 hours Thyroid Function Tests: No results found for this basename: TSH,T4TOTAL,FREET4,T3FREE,THYROIDAB in the last 72 hours Anemia Panel: No results found for this basename: VITAMINB12,FOLATE,FERRITIN,TIBC,IRON,RETICCTPCT in the last 72 hours Coagulation: No results found for this basename: LABPROT:2,INR:2 in the last 72 hours Urine Drug Screen:  Alcohol Level: No results found for this basename: ETH:2 in the last 72 hours Urinalysis:  Misc. Labs:   Micro Results: Recent Results (from the past 240 hour(s))  MRSA PCR SCREENING     Status: Abnormal   Collection Time   01/18/11  2:22 PM      Component Value Range Status Comment   MRSA by PCR POSITIVE (*) NEGATIVE  Final     Studies/Results: No results found.  Medications: Scheduled Meds:    . acidophilus  1 capsule Oral BID  . aspirin EC  81 mg Oral Daily  . buPROPion  150 mg Oral Daily  . Chlorhexidine Gluconate Cloth  6 each Topical Q0600  . cholecalciferol  800 Units Oral Daily  . clindamycin (CLEOCIN) IV  600 mg Intravenous Q6H  . enoxaparin  40 mg Subcutaneous Q24H  . fluconazole  100 mg Oral Daily  . FLUoxetine  10 mg Oral Daily  . furosemide  20 mg Oral BID  . glyBURIDE  5 mg Oral Q breakfast  . insulin aspart  0-20 Units Subcutaneous TID WC  . insulin aspart  0-5 Units Subcutaneous QHS  . insulin aspart  6 Units Subcutaneous TID WC  . insulin glargine  5 Units Subcutaneous QHS  . lamoTRIgine  50 mg Oral Daily  . methadone  10 mg Oral BID  . mupirocin ointment  1 application  Nasal BID  . pantoprazole  40 mg Oral Q1200  . sodium chloride  3 mL Intravenous Q12H   Continuous Infusions:    . sodium chloride 250 mL (01/20/11 1507)   PRN Meds:.albuterol, HYDROmorphone, polyvinyl alcohol, sodium chloride, sodium chloride, DISCONTD: HYDROmorphone  Assessment/Plan:  1. Cellulitis  versus infected Psoriatic lesions of right lateral abdominal skin folds and right thigh. Surgeons and interventional radiology consulted. The patient is anxious about getting any procedures such as needed aspiration or incision and drainage. Currently she is agreeable to getting an ultrasound of the right abdominal as well as thigh pannus. Continue intravenous clindamycin.Wound care nursing consultation is pending. 2. Extensive fungal infection of skin under the breasts and groin: significantly improved 3. Extensive psoriasis: We'll need to consult and follow up with dermatology as an outpatient. 4. Uncontrolled type 2 diabetes mellitus. Continue sliding scale insulin and oral meds and monitor. Will add low-dose Lantus at bedtime. 5. Morbid obesity.      HONGALGI,ANAND 01/21/2011, 5:39 PM

## 2011-01-22 ENCOUNTER — Other Ambulatory Visit (HOSPITAL_COMMUNITY): Payer: Medicare Other

## 2011-01-22 LAB — GLUCOSE, CAPILLARY: Glucose-Capillary: 123 mg/dL — ABNORMAL HIGH (ref 70–99)

## 2011-01-22 MED ORDER — INSULIN ASPART 100 UNIT/ML ~~LOC~~ SOLN: 6.0000 [IU] | Freq: Three times a day (TID) | SUBCUTANEOUS | Status: DC

## 2011-01-22 MED ORDER — CLINDAMYCIN HCL 300 MG PO CAPS: 300.0000 mg | ORAL_CAPSULE | Freq: Four times a day (QID) | ORAL | Status: DC

## 2011-01-22 MED ORDER — FLUCONAZOLE 100 MG PO TABS: 100.0000 mg | ORAL_TABLET | Freq: Every day | ORAL | Status: DC

## 2011-01-22 MED ORDER — INSULIN GLARGINE 100 UNIT/ML ~~LOC~~ SOLN: 5.0000 [IU] | Freq: Every day | SUBCUTANEOUS | Status: DC

## 2011-01-22 MED ORDER — METHADONE HCL 10 MG PO TABS: 10.0000 mg | ORAL_TABLET | Freq: Two times a day (BID) | ORAL | Status: DC

## 2011-01-22 MED ORDER — HYDROMORPHONE HCL 2 MG PO TABS: 2.0000 mg | ORAL_TABLET | Freq: Four times a day (QID) | ORAL | Status: DC | PRN

## 2011-01-22 MED ORDER — CLINDAMYCIN HCL 300 MG PO CAPS: 300.0000 mg | ORAL_CAPSULE | Freq: Four times a day (QID) | ORAL | Status: AC

## 2011-01-22 MED ORDER — FLUCONAZOLE 100 MG PO TABS: 100.0000 mg | ORAL_TABLET | Freq: Every day | ORAL | Status: AC

## 2011-01-22 NOTE — Discharge Summary (Signed)
DISCHARGE SUMMARY  [name]  MR#: [id number] DOB:05/21/1945   Date of Admission: 01/17/2011 Date of Discharge: 01/22/2011  Attending Physician:Janney Priego  Patient's PCP:KIM, Fayrene Fearing, MD, MD  Consults:  Gen. surgery: Dr. Luretha Murphy and Interventional radiology  Discharge Diagnoses: 1. Cellulitis of right lateral abdominal and right thigh pannus. 2. Fungal infection of skin under the breasts and groin 3. Extensive psoriasis 4. Uncontrolled type 2 diabetes mellitus 5. Morbid obesity 6. History of myasthenia gravis, antiphospholipid syndrome and Sjogren's disease. 7. History of anxiety and depression.    Current Discharge Medication List    START taking these medications   Details  clindamycin (CLEOCIN) 300 MG capsule Take 1 capsule (300 mg total) by mouth 4 (four) times daily. Qty: 20 capsule, Refills: 0    fluconazole (DIFLUCAN) 100 MG tablet Take 1 tablet (100 mg total) by mouth daily. Qty: 3 tablet, Refills: 0    insulin aspart (NOVOLOG) 100 UNIT/ML injection Inject 6 Units into the skin 3 (three) times daily with meals.    insulin glargine (LANTUS) 100 UNIT/ML injection Inject 5 Units into the skin at bedtime.      CONTINUE these medications which have CHANGED   Details  HYDROmorphone (DILAUDID) 2 MG tablet Take 1 tablet (2 mg total) by mouth every 6 (six) hours as needed for pain. For pain Qty: 10 tablet, Refills: 0    methadone (DOLOPHINE) 10 MG tablet Take 1 tablet (10 mg total) by mouth 2 (two) times daily. Qty: 6 tablet, Refills: 0      CONTINUE these medications which have NOT CHANGED   Details  acidophilus (RISAQUAD) CAPS Take 1 capsule by mouth 2 (two) times daily.      albuterol (PROVENTIL HFA;VENTOLIN HFA) 108 (90 BASE) MCG/ACT inhaler Inhale 1-2 puffs into the lungs every 4 (four) hours as needed. shortness of breath \\par       aspirin EC 81 MG tablet Take 81 mg by mouth daily.      buPROPion (WELLBUTRIN XL) 150 MG 24 hr tablet Take  150 mg by mouth daily.      cholecalciferol (VITAMIN D) 400 UNITS TABS Take 800 Units by mouth daily.      FLUoxetine (PROZAC) 10 MG capsule Take 10 mg by mouth daily.      furosemide (LASIX) 20 MG tablet Take 20 mg by mouth 2 (two) times daily.      glyBURIDE (DIABETA) 5 MG tablet Take 5 mg by mouth daily with breakfast.      lamoTRIgine (LAMICTAL) 25 MG tablet Take 50 mg by mouth daily.      methylcellulose (ARTIFICIAL TEARS) 1 % ophthalmic solution Place 1 drop into both eyes daily as needed.      omeprazole (PRILOSEC) 20 MG capsule Take 20 mg by mouth daily.      sodium chloride (OCEAN) 0.65 % nasal spray Place 1 spray into the nose as needed. May keep at bedside           Hospital Course: Patient is a 65 year old female, nursing home resident with extensive past medical history who apparently has seen a dermatologist in the past but has not been on medications for unclear reasons. She was sent to the emergency room due to worsening redness, swelling and pain especially on the right side of her abdomen and right thigh.  1. Cellulitis of the right lateral abdominal wall pannus and right lateral thigh pannus: Patient was admitted to the hospital. She was empirically started on IV clindamycin. The swellings on the  right lateral abdomen and right lateral thigh were suspicious for abscess. Surgeons were consulted. Ultrasound of those areas were done. There was no fluid collection. Her cellulitis has significantly improved. This may be complicating underlying psoriatic lesions. She will complete a total 10 days of antibiotics on 25th of November 2012. 2. Extensive fungal infection of the skin under the breasts and groin and groin: Patient will complete one week of Diflucan on 01/25/2011. These are also significantly improving. 3. Extensive psoriasis: Recommend outpatient consultation with her dermatologist and treatment of this as soon as possible. 4. Uncontrolled type 2 diabetes mellitus.  Low-dose Lantus and mealtime NovoLog has been added to her oral hypoglycemic agents. Blood sugars are reasonably controlled. 5.   Day of Discharge BP 140/73  Pulse 84  Temp(Src) 98.5 F (36.9 C) (Oral)  Resp 18  Ht 5\' 8"  (1.727 m)  Wt 165.4 kg (364 lb 10.3 oz)  BMI 55.44 kg/m2  SpO2 98%  Physical Exam: General exam: Morbidly obese. Comfortable. Respiratory system: Clear Cardiovascular system S1 and S2 heard, regular. No JVD. Gastrointestinal system: Abdomen nondistended, soft and normal bowel sounds heard. Central nervous system: Alert and oriented. No focal deficits. Skin: Moderate amount of redness and some scaling over the pannus on the right lateral abdomen and right upper lateral thigh. This is significantly better than on admission. There is no further oozing from the right thigh. The redness underneath her breasts and in the groin folds is significantly improved. She has extensive skin lesions from her psoriasis.  Results for orders placed during the hospital encounter of 01/17/11 (from the past 24 hour(s))  GLUCOSE, CAPILLARY     Status: Abnormal   Collection Time   01/21/11  6:33 PM      Component Value Range   Glucose-Capillary 159 (*) 70 - 99 (mg/dL)  GLUCOSE, CAPILLARY     Status: Abnormal   Collection Time   01/21/11 10:20 PM      Component Value Range   Glucose-Capillary 230 (*) 70 - 99 (mg/dL)   Comment 1 Notify RN    GLUCOSE, CAPILLARY     Status: Abnormal   Collection Time   01/22/11  8:07 AM      Component Value Range   Glucose-Capillary 170 (*) 70 - 99 (mg/dL)   Comment 1 Notify RN    GLUCOSE, CAPILLARY     Status: Abnormal   Collection Time   01/22/11 11:57 AM      Component Value Range   Glucose-Capillary 123 (*) 70 - 99 (mg/dL)   Comment 1 Notify RN     Laboratory Data:  1. Comprehensive metabolic panel on the 16th of November significant for glucose 206, alkaline phosphatase 140, albumin 2.8. 2. CBC significant for hemoglobin of  11.6. 3. Hemoglobin A1c was 8.8 4. Ultrasound:No evidence of abscess involving the subcutaneous tissues of the abdominal wall or the left thigh.  Disposition: Patient is discharged to skilled nursing facility in stable condition.  Follow-up Appts: Discharge Orders    Future Orders Please Complete By Expires   Diet - low sodium heart healthy      Increase activity slowly      Discharge instructions      Comments:   Keep skin dry and clean.   Call MD for:  temperature >100.4      Call MD for:  severe uncontrolled pain         Follow-up:  1. With Dr. Pearson Grippe: Call for an appointment to be seen in  5-7 days from hospital discharge. 2. Recommend outpatient dermatologist consult.  Tests Needing Follow-up: None.  SignedMarcellus Scott 01/22/2011, 1:41 PM

## 2011-01-22 NOTE — Progress Notes (Signed)
RN spoke with SW, Lyla Son, to get [facility]'s number.  [facility] called for the 2nd time to come and transport patient.  RN also spoke with Noreene Larsson at [facility] to let her know of the hold up.  RN to call Noreene Larsson when Sharin Mons is here to get the patient.

## 2011-01-22 NOTE — Progress Notes (Signed)
Agree with [name]' note. Call us PRN.  Ernestene Mention 01/22/2011 12:21 PM

## 2011-01-22 NOTE — Progress Notes (Signed)
Lyla Son, ED SW, was called regarding patient's transportation.  Patient's RN reported that the ambulance was called over 2 hours ago.  Will seek advice from another charge nurse until patient's transportation is clarified.

## 2011-01-22 NOTE — Progress Notes (Signed)
Subjective: Ultrasound of Right side of abdomen, and right lateral thigh:Edema, and indurated fat, with no abnormal fluid collection to suggest abscess.   Objective: Vital signs in last 24 hours: Temp:  [98.2 F (36.8 C)-98.5 F (36.9 C)] 98.5 F (36.9 C) (11/19 2245) Pulse Rate:  [80-84] 84  (11/19 2245) Resp:  [14-18] 18  (11/19 2245) SpO2:  [98 %] 98 % (11/19 2245) Last BM Date: 01/20/11  Intake/Output from previous day: 11/19 0701 - 11/20 0700 In: 970 [I.V.:220; IV Piggyback:750] Out: -  Intake/Output this shift:    General appearance: alert, cooperative and no distress No change in sites.  Lab Results:  No results found for this basename: WBC:2,HGB:2,HCT:2,PLT:2 in the last 72 hours  BMET No results found for this basename: NA:2,K:2,CL:2,CO2:2,GLUCOSE:2,BUN:2,CREATININE:2,CALCIUM:2 in the last 72 hours PT/INR No results found for this basename: LABPROT:2,INR:2 in the last 72 hours   Studies/Results: [place] Extrem Low Right Ltd (not Vascular)  01/21/2011  *RADIOLOGY REPORT*  Clinical Data: Cellulitis involving the abdominal wall and the right thigh.  Evaluate for possible abscess.  ULTRASOUND RIGHT LOWER EXTREMITY LIMITED 01/21/2011:  Technique:  Ultrasound examination of the region of interest in the right lower extremity was performed.  Comparison:  CT right lower extremity 12/04/2009 [facility].  Findings: Edema and indurated fat throughout the right side of the abdomen and the right lateral thigh, with no abnormal fluid collection to suggest abscess.  IMPRESSION: No evidence of abscess involving the subcutaneous tissues of the abdominal wall or the left thigh.  Original Report Authenticated By: Arnell Sieving, M.D.    Anti-infectives: Anti-infectives     Start     Dose/Rate Route Frequency Ordered Stop   01/18/11 1130   fluconazole (DIFLUCAN) tablet 100 mg        100 mg Oral Daily 01/18/11 1050     01/18/11 0015   clindamycin (CLEOCIN) IVPB 600 mg         600 mg 100 mL/hr over 30 Minutes Intravenous 4 times per day 01/18/11 0001     01/17/11 1800   clindamycin (CLEOCIN) IVPB 600 mg        600 mg 100 mL/hr over 30 Minutes Intravenous  Once 01/17/11 1735 01/17/11 1910         Current Facility-Administered Medications  Medication Dose Route Frequency Provider Last Rate Last Dose  . 0.9 %  sodium chloride infusion  250 mL Intravenous Continuous Peter Le 1 mL/hr at 01/21/11 1935 250 mL at 01/21/11 1935  . acidophilus (RISAQUAD) capsule 1 capsule  1 capsule Oral BID [place] Siren   1 capsule at 01/21/11 2111  . albuterol (PROVENTIL HFA;VENTOLIN HFA) 108 (90 BASE) MCG/ACT inhaler 1-2 puff  1-2 puff Inhalation Q4H PRN [place] Siren      . aspirin EC tablet 81 mg  81 mg Oral Daily Peter Le   81 mg at 01/21/11 1057  . buPROPion (WELLBUTRIN XL) 24 hr tablet 150 mg  150 mg Oral Daily Peter Le   150 mg at 01/21/11 1057  . Chlorhexidine Gluconate Cloth 2 % PADS 6 each  6 each Topical Q0600 Anand Hongalgi   6 each at 01/21/11 1247  . cholecalciferol (VITAMIN D) tablet 800 Units  800 Units Oral Daily Peter Le   800 Units at 01/21/11 1056  . clindamycin (CLEOCIN) IVPB 600 mg  600 mg Intravenous Q6H Peter Le   600 mg at 01/22/11 0513  . enoxaparin (LOVENOX) injection 40 mg  40 mg Subcutaneous Q24H Theron Arista  Le   40 mg at 01/21/11 0818  . fluconazole (DIFLUCAN) tablet 100 mg  100 mg Oral Daily Anand Hongalgi   100 mg at 01/21/11 1057  . FLUoxetine (PROZAC) capsule 10 mg  10 mg Oral Daily Peter Le   10 mg at 01/21/11 1057  . furosemide (LASIX) tablet 20 mg  20 mg Oral BID Peter Le   20 mg at 01/21/11 2111  . glyBURIDE (DIABETA) tablet 5 mg  5 mg Oral Q breakfast Houston Siren   5 mg at 01/21/11 0819  . HYDROmorphone (DILAUDID) injection 1-2 mg  1-2 mg Intravenous Q4H PRN Anand Hongalgi   2 mg at 01/22/11 0307  . insulin aspart (novoLOG) injection 0-20 Units  0-20 Units Subcutaneous TID WC Peter Le   4 Units at 01/21/11 1835  . insulin aspart (novoLOG) injection 0-5 Units   0-5 Units Subcutaneous QHS [place] Siren   2 Units at 01/21/11 2235  . insulin aspart (novoLOG) injection 6 Units  6 Units Subcutaneous TID WC Peter Le   6 Units at 01/21/11 1835  . insulin glargine (LANTUS) injection 5 Units  5 Units Subcutaneous QHS Anand Hongalgi   5 Units at 01/21/11 2112  . lamoTRIgine (LAMICTAL) tablet 50 mg  50 mg Oral Daily Peter Le   50 mg at 01/21/11 1056  . methadone (DOLOPHINE) tablet 10 mg  10 mg Oral BID [place] Siren   10 mg at 01/21/11 2111  . mupirocin ointment (BACTROBAN) 2 % 1 application  1 application Nasal BID Anand Hongalgi   1 application at 01/21/11 2112  . pantoprazole (PROTONIX) EC tablet 40 mg  40 mg Oral Q1200 Peter Le   40 mg at 01/21/11 1246  . polyvinyl alcohol (LIQUIFILM TEARS) 1.4 % ophthalmic solution 1 drop  1 drop Both Eyes Daily PRN [place] Siren   1 drop at 01/19/11 1156  . sodium chloride (OCEAN) 0.65 % nasal spray 1 spray  1 spray Nasal PRN [place] Siren   1 spray at 01/19/11 1155  . sodium chloride 0.9 % injection 3 mL  3 mL Intravenous Q12H Houston Siren      . sodium chloride 0.9 % injection 3 mL  3 mL Intravenous PRN [place] Siren   3 mL at 01/18/11 2146    Assessment/Plan    Cellulits with abdominal wall, R lateral thigh with inflammatory changes. Patient Active Problem List  Diagnoses  . Psoriasis (a type of skin inflammation)  . Cellulitis and abscess of buttock  . Diabetes mellitus  . Obesity (BMI 30-39.9)  . Myasthenia gravis  . Antiphospholipid syndrome  Plan:  No apparent fluid collections to drain.  I don't see any surgical issues at this point. Will be available if you need [place].  Please call.  LOS: 5 days    Tashanda Fuhrer 01/22/2011

## 2011-03-20 IMAGING — CT CT EXTREM LOW W/O CM*R*
1 of 2 series · 1 of 14 positions shown, 2 images · non-contrast
Comparison: None

CLINICAL DATA: Right lateral thigh pain and swelling.

CT RIGHT THIGH WITHOUT CONTRAST
TECHNIQUE: Multidetector CT imaging of the right thigh was
performed according to the standard protocol without intravenous
contrast. Multiplanar CT image reconstructions were also generated.

[Series 2: control scan 2.0 b60s · axial · 0.51mm/px · z∈[-374,-374]mm · 1 of 1 slices shown, 2 images]
[im 1/1  soft-tissue]
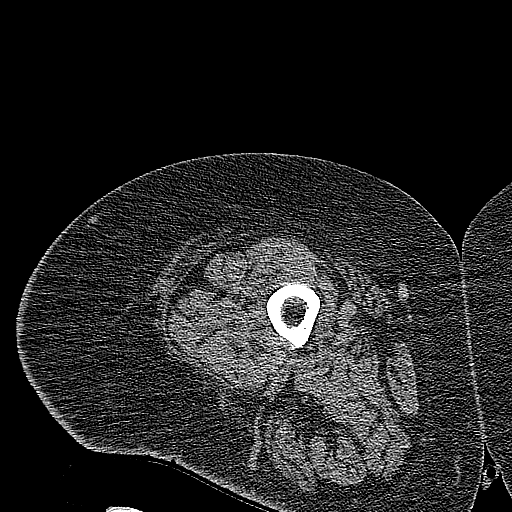
[im 1/1  bone]
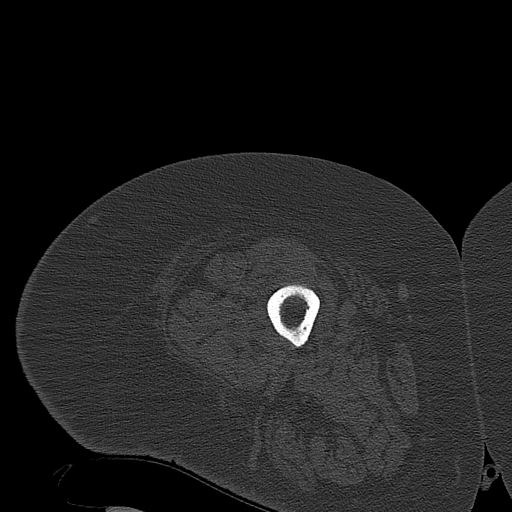

[1 of 14 positions shown; findings below may reference images not displayed]

FINDINGS: There is mild diffuse edema like changes in the
subcutaneous fat along the lateral aspect of the right thigh.  This
may reflects cellulitis.  Slight skin thickening is also present.
No definite CT findings for myofasciitis.  No discrete soft tissue
abscess.  No definite destructive bony changes involving the right
femur.  The hip and knee joints are maintained.  Mild degenerative
changes are noted.
IMPRESSION: 1.  Findings suggest cellulitis involving the right lateral thigh.
2.  No CT findings to suggest myofasciitis, discrete soft tissue
abscess or osteomyelitis.

## 2011-09-09 ENCOUNTER — Other Ambulatory Visit (HOSPITAL_COMMUNITY): Payer: Self-pay | Admitting: Internal Medicine

## 2011-09-09 DIAGNOSIS — A4902 Methicillin resistant Staphylococcus aureus infection, unspecified site: Secondary | ICD-10-CM

## 2011-09-10 ENCOUNTER — Ambulatory Visit (HOSPITAL_COMMUNITY)
Admission: RE | Admit: 2011-09-10 | Discharge: 2011-09-10 | Disposition: A | Discharge status: Home / Self Care | Payer: Medicare Other | Source: Ambulatory Visit | Attending: Internal Medicine | Admitting: Internal Medicine

## 2011-09-10 ENCOUNTER — Other Ambulatory Visit (HOSPITAL_COMMUNITY): Payer: Self-pay | Admitting: Internal Medicine

## 2011-09-10 DIAGNOSIS — L0291 Cutaneous abscess, unspecified: Secondary | ICD-10-CM | POA: Insufficient documentation

## 2011-09-10 DIAGNOSIS — L0390 Cellulitis, unspecified: Secondary | ICD-10-CM | POA: Insufficient documentation

## 2011-09-10 DIAGNOSIS — A4902 Methicillin resistant Staphylococcus aureus infection, unspecified site: Secondary | ICD-10-CM

## 2011-09-10 MED ORDER — LIDOCAINE HCL 1 % IJ SOLN
INTRAMUSCULAR | Status: AC
  Filled 2011-09-10: qty 20

## 2011-09-10 NOTE — Procedures (Signed)
US/fluoroscopic guided right DL basilic vein PICC placed. Length 47 cm. Tip SVC/RA junction. No immediate complications.

## 2013-10-16 ENCOUNTER — Inpatient Hospital Stay (HOSPITAL_COMMUNITY)
Admission: EM | Admit: 2013-10-16 | Discharge: 2013-10-21 | DRG: 871 | Disposition: A | Discharge status: Skilled Nursing Facility | Payer: Medicare Other | Attending: Internal Medicine | Admitting: Internal Medicine

## 2013-10-16 ENCOUNTER — Encounter (HOSPITAL_COMMUNITY): Payer: Self-pay | Admitting: Emergency Medicine

## 2013-10-16 ENCOUNTER — Emergency Department (HOSPITAL_COMMUNITY): Payer: Medicare Other

## 2013-10-16 DIAGNOSIS — E1142 Type 2 diabetes mellitus with diabetic polyneuropathy: Secondary | ICD-10-CM | POA: Diagnosis present

## 2013-10-16 DIAGNOSIS — Z7401 Bed confinement status: Secondary | ICD-10-CM

## 2013-10-16 DIAGNOSIS — J690 Pneumonitis due to inhalation of food and vomit: Secondary | ICD-10-CM

## 2013-10-16 DIAGNOSIS — M3500 Sicca syndrome, unspecified: Secondary | ICD-10-CM | POA: Diagnosis present

## 2013-10-16 DIAGNOSIS — D6859 Other primary thrombophilia: Secondary | ICD-10-CM | POA: Diagnosis present

## 2013-10-16 DIAGNOSIS — J441 Chronic obstructive pulmonary disease with (acute) exacerbation: Secondary | ICD-10-CM | POA: Diagnosis present

## 2013-10-16 DIAGNOSIS — R569 Unspecified convulsions: Secondary | ICD-10-CM | POA: Diagnosis present

## 2013-10-16 DIAGNOSIS — E662 Morbid (severe) obesity with alveolar hypoventilation: Secondary | ICD-10-CM | POA: Diagnosis present

## 2013-10-16 DIAGNOSIS — L0231 Cutaneous abscess of buttock: Secondary | ICD-10-CM

## 2013-10-16 DIAGNOSIS — G9349 Other encephalopathy: Secondary | ICD-10-CM | POA: Diagnosis present

## 2013-10-16 DIAGNOSIS — J45901 Unspecified asthma with (acute) exacerbation: Secondary | ICD-10-CM | POA: Diagnosis present

## 2013-10-16 DIAGNOSIS — Z794 Long term (current) use of insulin: Secondary | ICD-10-CM

## 2013-10-16 DIAGNOSIS — J189 Pneumonia, unspecified organism: Secondary | ICD-10-CM | POA: Diagnosis present

## 2013-10-16 DIAGNOSIS — A419 Sepsis, unspecified organism: Secondary | ICD-10-CM

## 2013-10-16 DIAGNOSIS — Z23 Encounter for immunization: Secondary | ICD-10-CM | POA: Diagnosis not present

## 2013-10-16 DIAGNOSIS — E119 Type 2 diabetes mellitus without complications: Secondary | ICD-10-CM

## 2013-10-16 DIAGNOSIS — Z66 Do not resuscitate: Secondary | ICD-10-CM | POA: Diagnosis present

## 2013-10-16 DIAGNOSIS — T400X1A Poisoning by opium, accidental (unintentional), initial encounter: Secondary | ICD-10-CM | POA: Diagnosis present

## 2013-10-16 DIAGNOSIS — D6861 Antiphospholipid syndrome: Secondary | ICD-10-CM | POA: Diagnosis present

## 2013-10-16 DIAGNOSIS — M549 Dorsalgia, unspecified: Secondary | ICD-10-CM | POA: Diagnosis present

## 2013-10-16 DIAGNOSIS — G7000 Myasthenia gravis without (acute) exacerbation: Secondary | ICD-10-CM | POA: Diagnosis present

## 2013-10-16 DIAGNOSIS — Z87891 Personal history of nicotine dependence: Secondary | ICD-10-CM | POA: Diagnosis not present

## 2013-10-16 DIAGNOSIS — A4102 Sepsis due to Methicillin resistant Staphylococcus aureus: Principal | ICD-10-CM

## 2013-10-16 DIAGNOSIS — L408 Other psoriasis: Secondary | ICD-10-CM | POA: Diagnosis present

## 2013-10-16 DIAGNOSIS — G8929 Other chronic pain: Secondary | ICD-10-CM | POA: Diagnosis present

## 2013-10-16 DIAGNOSIS — Z79899 Other long term (current) drug therapy: Secondary | ICD-10-CM

## 2013-10-16 DIAGNOSIS — R509 Fever, unspecified: Secondary | ICD-10-CM

## 2013-10-16 DIAGNOSIS — Z7982 Long term (current) use of aspirin: Secondary | ICD-10-CM

## 2013-10-16 DIAGNOSIS — L03317 Cellulitis of buttock: Secondary | ICD-10-CM

## 2013-10-16 DIAGNOSIS — Z9981 Dependence on supplemental oxygen: Secondary | ICD-10-CM | POA: Diagnosis not present

## 2013-10-16 DIAGNOSIS — J9600 Acute respiratory failure, unspecified whether with hypoxia or hypercapnia: Secondary | ICD-10-CM | POA: Diagnosis present

## 2013-10-16 DIAGNOSIS — R0902 Hypoxemia: Secondary | ICD-10-CM

## 2013-10-16 DIAGNOSIS — E669 Obesity, unspecified: Secondary | ICD-10-CM

## 2013-10-16 DIAGNOSIS — Z6841 Body Mass Index (BMI) 40.0 and over, adult: Secondary | ICD-10-CM

## 2013-10-16 DIAGNOSIS — F319 Bipolar disorder, unspecified: Secondary | ICD-10-CM | POA: Diagnosis present

## 2013-10-16 DIAGNOSIS — D638 Anemia in other chronic diseases classified elsewhere: Secondary | ICD-10-CM | POA: Diagnosis present

## 2013-10-16 DIAGNOSIS — K219 Gastro-esophageal reflux disease without esophagitis: Secondary | ICD-10-CM | POA: Diagnosis present

## 2013-10-16 DIAGNOSIS — L259 Unspecified contact dermatitis, unspecified cause: Secondary | ICD-10-CM | POA: Diagnosis present

## 2013-10-16 DIAGNOSIS — T40601A Poisoning by unspecified narcotics, accidental (unintentional), initial encounter: Secondary | ICD-10-CM | POA: Diagnosis present

## 2013-10-16 DIAGNOSIS — Z880 Allergy status to penicillin: Secondary | ICD-10-CM | POA: Diagnosis not present

## 2013-10-16 DIAGNOSIS — E1149 Type 2 diabetes mellitus with other diabetic neurological complication: Secondary | ICD-10-CM | POA: Diagnosis present

## 2013-10-16 DIAGNOSIS — E440 Moderate protein-calorie malnutrition: Secondary | ICD-10-CM | POA: Diagnosis present

## 2013-10-16 DIAGNOSIS — J9601 Acute respiratory failure with hypoxia: Secondary | ICD-10-CM

## 2013-10-16 DIAGNOSIS — E11610 Type 2 diabetes mellitus with diabetic neuropathic arthropathy: Secondary | ICD-10-CM

## 2013-10-16 DIAGNOSIS — L409 Psoriasis, unspecified: Secondary | ICD-10-CM

## 2013-10-16 HISTORY — DX: Bipolar disorder, unspecified: F31.9

## 2013-10-16 HISTORY — DX: Unspecified convulsions: R56.9

## 2013-10-16 LAB — CBC WITH DIFFERENTIAL/PLATELET
BASOS PCT: 0 % (ref 0–1)
Basophils Absolute: 0 10*3/uL (ref 0.0–0.1)
EOS PCT: 0 % (ref 0–5)
Eosinophils Absolute: 0 10*3/uL (ref 0.0–0.7)
HEMATOCRIT: 40.7 % (ref 36.0–46.0)
HEMOGLOBIN: 12.4 g/dL (ref 12.0–15.0)
LYMPHS ABS: 1.1 10*3/uL (ref 0.7–4.0)
Lymphocytes Relative: 6 % — ABNORMAL LOW (ref 12–46)
MCH: 25.4 pg — ABNORMAL LOW (ref 26.0–34.0)
MCHC: 30.5 g/dL (ref 30.0–36.0)
MCV: 83.4 fL (ref 78.0–100.0)
MONOS PCT: 5 % (ref 3–12)
Monocytes Absolute: 0.9 10*3/uL (ref 0.1–1.0)
NEUTROS ABS: 16.8 10*3/uL — AB (ref 1.7–7.7)
Neutrophils Relative %: 89 % — ABNORMAL HIGH (ref 43–77)
Platelets: ADEQUATE 10*3/uL (ref 150–400)
RBC: 4.88 MIL/uL (ref 3.87–5.11)
RDW: 17.2 % — AB (ref 11.5–15.5)
WBC: 18.8 10*3/uL — AB (ref 4.0–10.5)

## 2013-10-16 LAB — URINALYSIS, ROUTINE W REFLEX MICROSCOPIC
BILIRUBIN URINE: NEGATIVE
Glucose, UA: NEGATIVE mg/dL
KETONES UR: NEGATIVE mg/dL
Leukocytes, UA: NEGATIVE
NITRITE: NEGATIVE
PH: 5 (ref 5.0–8.0)
Protein, ur: NEGATIVE mg/dL
Specific Gravity, Urine: 1.017 (ref 1.005–1.030)
UROBILINOGEN UA: 0.2 mg/dL (ref 0.0–1.0)

## 2013-10-16 LAB — BLOOD GAS, ARTERIAL
ACID-BASE EXCESS: 3.1 mmol/L — AB (ref 0.0–2.0)
Acid-Base Excess: 4.5 mmol/L — ABNORMAL HIGH (ref 0.0–2.0)
BICARBONATE: 30.2 meq/L — AB (ref 20.0–24.0)
Bicarbonate: 31.5 mEq/L — ABNORMAL HIGH (ref 20.0–24.0)
DRAWN BY: 295031
Drawn by: 295031
O2 CONTENT: 5 L/min
O2 Content: 3 L/min
O2 Saturation: 91.4 %
O2 Saturation: 92.4 %
PCO2 ART: 61.6 mmHg — AB (ref 35.0–45.0)
PCO2 ART: 61.8 mmHg — AB (ref 35.0–45.0)
PO2 ART: 67.1 mmHg — AB (ref 80.0–100.0)
Patient temperature: 98.6
Patient temperature: 98.6
TCO2: 28.8 mmol/L (ref 0–100)
TCO2: 28 mmol/L (ref 0–100)
pH, Arterial: 7.329 — ABNORMAL LOW (ref 7.350–7.450)
pH, Arterial: 7.31 — ABNORMAL LOW (ref 7.350–7.450)
pO2, Arterial: 72.1 mmHg — ABNORMAL LOW (ref 80.0–100.0)

## 2013-10-16 LAB — I-STAT TROPONIN, ED: TROPONIN I, POC: 0.05 ng/mL (ref 0.00–0.08)

## 2013-10-16 LAB — COMPREHENSIVE METABOLIC PANEL
ALK PHOS: 147 U/L — AB (ref 39–117)
ALT: 8 U/L (ref 0–35)
ANION GAP: 12 (ref 5–15)
AST: 11 U/L (ref 0–37)
Albumin: 2.7 g/dL — ABNORMAL LOW (ref 3.5–5.2)
BILIRUBIN TOTAL: 0.4 mg/dL (ref 0.3–1.2)
BUN: 13 mg/dL (ref 6–23)
CHLORIDE: 95 meq/L — AB (ref 96–112)
CO2: 28 meq/L (ref 19–32)
CREATININE: 0.96 mg/dL (ref 0.50–1.10)
Calcium: 10 mg/dL (ref 8.4–10.5)
GFR calc Af Amer: 69 mL/min — ABNORMAL LOW (ref >90)
GFR, EST NON AFRICAN AMERICAN: 60 mL/min — AB (ref >90)
Glucose, Bld: 143 mg/dL — ABNORMAL HIGH (ref 70–99)
Potassium: 4.5 mEq/L (ref 3.7–5.3)
Sodium: 135 mEq/L — ABNORMAL LOW (ref 137–147)
Total Protein: 6.9 g/dL (ref 6.0–8.3)

## 2013-10-16 LAB — MRSA PCR SCREENING: MRSA by PCR: NEGATIVE

## 2013-10-16 LAB — I-STAT CG4 LACTIC ACID, ED: LACTIC ACID, VENOUS: 1.7 mmol/L (ref 0.5–2.2)

## 2013-10-16 LAB — URINE MICROSCOPIC-ADD ON

## 2013-10-16 LAB — GLUCOSE, CAPILLARY: GLUCOSE-CAPILLARY: 109 mg/dL — AB (ref 70–99)

## 2013-10-16 LAB — LACTIC ACID, PLASMA: LACTIC ACID, VENOUS: 2.1 mmol/L (ref 0.5–2.2)

## 2013-10-16 MED ORDER — SODIUM CHLORIDE 0.9 % IV SOLN
2000.0000 mg | Freq: Once | INTRAVENOUS | Status: AC
  Administered 2013-10-16: 2000 mg via INTRAVENOUS
  Filled 2013-10-16: qty 2000

## 2013-10-16 MED ORDER — PANTOPRAZOLE SODIUM 40 MG PO TBEC
40.0000 mg | DELAYED_RELEASE_TABLET | Freq: Every day | ORAL | Status: DC
  Administered 2013-10-17 – 2013-10-21 (×5): 40 mg via ORAL
  Filled 2013-10-16 (×5): qty 1

## 2013-10-16 MED ORDER — INSULIN ASPART 100 UNIT/ML ~~LOC~~ SOLN: 0.0000 [IU] | Freq: Every day | SUBCUTANEOUS | Status: DC

## 2013-10-16 MED ORDER — INSULIN GLARGINE 100 UNIT/ML ~~LOC~~ SOLN
35.0000 [IU] | Freq: Every day | SUBCUTANEOUS | Status: DC
  Administered 2013-10-16: 35 [IU] via SUBCUTANEOUS
  Filled 2013-10-16 (×2): qty 0.35

## 2013-10-16 MED ORDER — GLIPIZIDE 5 MG PO TABS
5.0000 mg | ORAL_TABLET | Freq: Every day | ORAL | Status: DC
  Administered 2013-10-18 – 2013-10-21 (×4): 5 mg via ORAL
  Filled 2013-10-16 (×7): qty 1

## 2013-10-16 MED ORDER — NALOXONE HCL 0.4 MG/ML IJ SOLN: 0.4000 mg | INTRAMUSCULAR | Status: DC | PRN

## 2013-10-16 MED ORDER — ONDANSETRON HCL 4 MG/2ML IJ SOLN: 4.0000 mg | Freq: Four times a day (QID) | INTRAMUSCULAR | Status: DC | PRN

## 2013-10-16 MED ORDER — DIPHENOXYLATE-ATROPINE 2.5-0.025 MG PO TABS: 2.0000 | ORAL_TABLET | Freq: Four times a day (QID) | ORAL | Status: DC | PRN

## 2013-10-16 MED ORDER — CLOBETASOL PROPIONATE 0.05 % EX CREA
TOPICAL_CREAM | Freq: Two times a day (BID) | CUTANEOUS | Status: DC
  Administered 2013-10-16 – 2013-10-20 (×8): via TOPICAL
  Filled 2013-10-16 (×4): qty 15

## 2013-10-16 MED ORDER — DEXTROSE 5 % IV SOLN
2.0000 g | Freq: Three times a day (TID) | INTRAVENOUS | Status: AC
  Administered 2013-10-16 – 2013-10-19 (×10): 2 g via INTRAVENOUS
  Filled 2013-10-16 (×11): qty 2

## 2013-10-16 MED ORDER — GABAPENTIN 300 MG PO CAPS
600.0000 mg | ORAL_CAPSULE | Freq: Three times a day (TID) | ORAL | Status: DC
  Administered 2013-10-16 – 2013-10-21 (×14): 600 mg via ORAL
  Filled 2013-10-16 (×17): qty 2

## 2013-10-16 MED ORDER — NALOXONE HCL 0.4 MG/ML IJ SOLN
0.4000 mg | Freq: Once | INTRAMUSCULAR | Status: AC
  Administered 2013-10-16: 0.4 mg via INTRAVENOUS
  Filled 2013-10-16: qty 1

## 2013-10-16 MED ORDER — SODIUM CHLORIDE 0.9 % IV SOLN
1000.0000 mL | Freq: Once | INTRAVENOUS | Status: DC
  Administered 2013-10-16: 1000 mL via INTRAVENOUS

## 2013-10-16 MED ORDER — VANCOMYCIN HCL 10 G IV SOLR
1500.0000 mg | Freq: Two times a day (BID) | INTRAVENOUS | Status: DC
  Administered 2013-10-17 – 2013-10-18 (×3): 1500 mg via INTRAVENOUS
  Filled 2013-10-16 (×4): qty 1500

## 2013-10-16 MED ORDER — HYDROCODONE-ACETAMINOPHEN 5-325 MG PO TABS: 1.0000 | ORAL_TABLET | ORAL | Status: DC | PRN

## 2013-10-16 MED ORDER — DEXTROSE 5 % IV SOLN
2.0000 g | Freq: Once | INTRAVENOUS | Status: AC
  Administered 2013-10-16: 2 g via INTRAVENOUS
  Filled 2013-10-16: qty 2

## 2013-10-16 MED ORDER — POLYETHYLENE GLYCOL 3350 17 G PO PACK
17.0000 g | PACK | Freq: Every day | ORAL | Status: DC | PRN
Start: 2013-10-16 — End: 2013-10-21
  Filled 2013-10-16: qty 1

## 2013-10-16 MED ORDER — ASPIRIN EC 81 MG PO TBEC
81.0000 mg | DELAYED_RELEASE_TABLET | Freq: Every day | ORAL | Status: DC
  Administered 2013-10-17 – 2013-10-21 (×5): 81 mg via ORAL
  Filled 2013-10-16 (×5): qty 1

## 2013-10-16 MED ORDER — SODIUM CHLORIDE 0.9 % IV SOLN
1000.0000 mL | INTRAVENOUS | Status: DC
  Administered 2013-10-16 – 2013-10-17 (×2): 1000 mL via INTRAVENOUS

## 2013-10-16 MED ORDER — SODIUM CHLORIDE 0.9 % IV SOLN: 1000.0000 mL | INTRAVENOUS | Status: DC

## 2013-10-16 MED ORDER — GUAIFENESIN-DM 100-10 MG/5ML PO SYRP: 5.0000 mL | ORAL_SOLUTION | ORAL | Status: DC | PRN

## 2013-10-16 MED ORDER — INSULIN ASPART 100 UNIT/ML ~~LOC~~ SOLN
0.0000 [IU] | Freq: Three times a day (TID) | SUBCUTANEOUS | Status: DC
  Administered 2013-10-17: 1 [IU] via SUBCUTANEOUS
  Administered 2013-10-18: 3 [IU] via SUBCUTANEOUS
  Administered 2013-10-18 (×2): 2 [IU] via SUBCUTANEOUS
  Administered 2013-10-19: 3 [IU] via SUBCUTANEOUS

## 2013-10-16 MED ORDER — RISAQUAD PO CAPS
1.0000 | ORAL_CAPSULE | Freq: Two times a day (BID) | ORAL | Status: DC
  Administered 2013-10-16 – 2013-10-21 (×10): 1 via ORAL
  Filled 2013-10-16 (×11): qty 1

## 2013-10-16 MED ORDER — LAMOTRIGINE 25 MG PO TABS
75.0000 mg | ORAL_TABLET | Freq: Two times a day (BID) | ORAL | Status: DC
  Administered 2013-10-16 – 2013-10-21 (×10): 75 mg via ORAL
  Filled 2013-10-16 (×11): qty 3

## 2013-10-16 MED ORDER — LEVOFLOXACIN IN D5W 750 MG/150ML IV SOLN
750.0000 mg | Freq: Once | INTRAVENOUS | Status: AC
  Administered 2013-10-16: 750 mg via INTRAVENOUS
  Filled 2013-10-16: qty 150

## 2013-10-16 MED ORDER — FLUOXETINE HCL 20 MG PO CAPS
20.0000 mg | ORAL_CAPSULE | Freq: Every day | ORAL | Status: DC
  Administered 2013-10-17 – 2013-10-21 (×5): 20 mg via ORAL
  Filled 2013-10-16 (×5): qty 1

## 2013-10-16 MED ORDER — METHADONE HCL 5 MG PO TABS
5.0000 mg | ORAL_TABLET | Freq: Two times a day (BID) | ORAL | Status: DC
  Administered 2013-10-16 – 2013-10-21 (×10): 5 mg via ORAL
  Filled 2013-10-16 (×10): qty 1

## 2013-10-16 MED ORDER — LEVOFLOXACIN IN D5W 750 MG/150ML IV SOLN: 750.0000 mg | INTRAVENOUS | Status: DC

## 2013-10-16 MED ORDER — SODIUM CHLORIDE 0.9 % IJ SOLN
3.0000 mL | Freq: Two times a day (BID) | INTRAMUSCULAR | Status: DC
  Administered 2013-10-17 – 2013-10-20 (×5): 3 mL via INTRAVENOUS

## 2013-10-16 MED ORDER — HEPARIN SODIUM (PORCINE) 5000 UNIT/ML IJ SOLN
5000.0000 [IU] | Freq: Three times a day (TID) | INTRAMUSCULAR | Status: DC
  Administered 2013-10-16 – 2013-10-21 (×13): 5000 [IU] via SUBCUTANEOUS
  Filled 2013-10-16 (×20): qty 1

## 2013-10-16 MED ORDER — ONDANSETRON HCL 4 MG/2ML IJ SOLN
4.0000 mg | Freq: Once | INTRAMUSCULAR | Status: AC
  Administered 2013-10-16: 4 mg via INTRAVENOUS
  Filled 2013-10-16: qty 2

## 2013-10-16 MED ORDER — NALOXONE HCL 0.4 MG/ML IJ SOLN
0.4000 mg | Freq: Once | INTRAMUSCULAR | Status: AC
Start: 2013-10-16 — End: 2013-10-16
  Administered 2013-10-16: 0.4 mg via INTRAVENOUS
  Filled 2013-10-16: qty 1

## 2013-10-16 MED ORDER — ONDANSETRON HCL 4 MG PO TABS: 4.0000 mg | ORAL_TABLET | Freq: Four times a day (QID) | ORAL | Status: DC | PRN

## 2013-10-16 MED ORDER — VANCOMYCIN HCL IN DEXTROSE 1-5 GM/200ML-% IV SOLN: 1000.0000 mg | Freq: Once | INTRAVENOUS | Status: DC

## 2013-10-16 NOTE — ED Notes (Addendum)
[facility]. Pt alert and oriented to self. Pt  Has periods of ability to state month and president with PT[facility]. Pt denies complaint of pain.

## 2013-10-16 NOTE — ED Notes (Signed)
Immediately post narcan administration pt becomes irritated and more alert. Pt presently nauseated with vomiting bile at present time. Pt repositioned into upright position. MD notified.

## 2013-10-16 NOTE — ED Notes (Addendum)
Joni ReiningNicole from facility verbalizes pt weight and reports pt recently diagnoses with URI and on amoxicillin 500 mg every 8 hours for 14 days. Pt NORMALLY NOT on oxygen but placed on 2 lpm [place] by staff today for oxygen saturation of 50% on RA. Pt normally alerted and oriented x4 and become lethargic today attempt to walk which is not her normal.

## 2013-10-16 NOTE — ED Notes (Signed)
Respiratory called and made aware of order for arterial blood gas.

## 2013-10-16 NOTE — ED Notes (Signed)
Pt has not vomited post zofran administration.

## 2013-10-16 NOTE — ED Provider Notes (Signed)
TIME SEEN: 2:20 PM  CHIEF COMPLAINT: Sepsis  HPI: Patient is a 68 y.o. F with history of morbid obesity, psoriasis, myasthenia gravis, diabetes, on chronic oxygen at her nursing facility presents with fever, cough and hypoxia. Patient is drowsy and is a very poor historian. She denies any chest pain or shortness of breath. Denies headache or neck pain, neck stiffness. No vomiting or diarrhea. No abdominal pain. She states her psoriasis is chronic and does not appear worse than baseline.  Nursing home reports patient is not chronically on oxygen despite EMS report. They state they started oxygen for her today given her hypoxia. They state she was diagnosed with a URI recently and has been on amoxicillin. They state they sent her to the emergency department his oxygen saturation was 50% on 2 L nasal cannula. Here she is satting 92% to 94% on 5 L nasal cannula.  ROS: See HPI Constitutional:  fever  Eyes: no drainage  ENT: no runny nose   Cardiovascular:  no chest pain  Resp: no SOB  GI: no vomiting GU: no dysuria Integumentary: no rash  Allergy: no hives  Musculoskeletal: no leg swelling  Neurological: no slurred speech ROS otherwise negative  PAST MEDICAL HISTORY/PAST SURGICAL HISTORY:  Past Medical History  Diagnosis Date  . MRSA cellulitis   . Psoriasis   . Obesities, morbid   . Sjogren's disease   . UTI (urinary tract infection)   . Antiphospholipid syndrome   . Diverticulosis   . Anxiety   . Depression   . Asthma   . Ventral hernia   . Diabetes mellitus   . Myasthenia gravis   . Myasthenia gravis   . Myasthenia gravis   . Back pain, chronic     missing some vertebra per pt  . Complication of anesthesia     can't have muscle relaxers -my. gravis  . Bipolar disorder, unspecified   . Seizures     MEDICATIONS:  Prior to Admission medications   Medication Sig Start Date End Date Taking? Authorizing Provider  aspirin EC 81 MG tablet Take 81 mg by mouth daily.     Yes  Historical Provider, MD  Biotin 2500 MCG CAPS Take 2,500 mcg by mouth daily.   Yes Historical Provider, MD  glipiZIDE (GLUCOTROL) 10 MG tablet Take 10 mg by mouth daily before breakfast.   Yes Historical Provider, MD  acidophilus (RISAQUAD) CAPS Take 1 capsule by mouth 2 (two) times daily.      Historical Provider, MD  albuterol (PROVENTIL HFA;VENTOLIN HFA) 108 (90 BASE) MCG/ACT inhaler Inhale 1-2 puffs into the lungs every 4 (four) hours as needed. shortness of breath \      Historical Provider, MD  buPROPion (WELLBUTRIN XL) 150 MG 24 hr tablet Take 150 mg by mouth daily.      Historical Provider, MD  cholecalciferol (VITAMIN D) 400 UNITS TABS Take 800 Units by mouth daily.      Historical Provider, MD  furosemide (LASIX) 20 MG tablet Take 20 mg by mouth 2 (two) times daily.      Historical Provider, MD  glyBURIDE (DIABETA) 5 MG tablet Take 5 mg by mouth daily with breakfast.      Historical Provider, MD  HYDROmorphone (DILAUDID) 2 MG tablet Take 1 tablet (2 mg total) by mouth every 6 (six) hours as needed for pain. For pain 01/22/11   Elease Etienne, MD  lamoTRIgine (LAMICTAL) 25 MG tablet Take 50 mg by mouth daily.  Historical Provider, MD  methadone (DOLOPHINE) 10 MG tablet Take 1 tablet (10 mg total) by mouth 2 (two) times daily. 01/22/11   Elease EtienneAnand D Hongalgi, MD  methylcellulose (ARTIFICIAL TEARS) 1 % ophthalmic solution Place 1 drop into both eyes daily as needed.      Historical Provider, MD  omeprazole (PRILOSEC) 20 MG capsule Take 20 mg by mouth daily.      Historical Provider, MD  sodium chloride (OCEAN) 0.65 % nasal spray Place 1 spray into the nose as needed. May keep at bedside     Historical Provider, MD    ALLERGIES:  Allergies  Allergen Reactions  . Penicillins Other (See Comments)    Reaction=unknown    SOCIAL HISTORY:  History  Substance Use Topics  . Smoking status: Former Games developermoker  . Smokeless tobacco: Never Used  . Alcohol Use: No    FAMILY HISTORY: No  family history on file.  EXAM: BP 121/61  Pulse 100  Temp(Src) 99.2 F (37.3 C) (Oral)  Resp 24  SpO2 90% CONSTITUTIONAL: Alert and oriented to person but not place and time and responds appropriately to questions intermittently. Well-appearing; well-nourished, morbidly obese HEAD: Normocephalic EYES: Conjunctivae clear, pupils pinpoint ENT: normal nose; no rhinorrhea; moist mucous membranes; pharynx without lesions noted NECK: Supple, no meningismus, no LAD  CARD: RRR; S1 and S2 appreciated; no murmurs, no clicks, no rubs, no gallops RESP: Normal chest excursion without splinting, patient is mildly tachypnea, rhonchorous breath sounds, no wheezing or rales, patient is mildly hypoxic, on 4 L of oxygen, speaking full sentences ABD/GI: Normal bowel sounds; non-distended; soft, non-tender, no rebound, no guarding BACK:  The back appears normal and is non-tender to palpation, there is no CVA tenderness EXT: Normal ROM in all joints; non-tender to palpation; no edema; normal capillary refill; no cyanosis    SKIN: Normal color for age and race; warm, multiple areas of erythematous plaque-like scaly lesions to her torso and extremities with no sign of superimposed infection NEURO: Moves all extremities equally, cranial nerves II through XII intact, sensation to light touch intact diffusely, patient is drowsy but easily arousable PSYCH: The patient's mood and manner are appropriate. Grooming and personal hygiene are appropriate.  MEDICAL DECISION MAKING: Patient here with likely pneumonia, sepsis. Septic protocol started. Will give broad-spectrum antibiotics. Cultures, urine, chest x-ray ordered. Will need admission.  ED PROGRESS: Patient is on methadone chronically. She was given Narcan and now is much more awake but is actively vomiting. We'll give Zofran. Her labs show pH 7.329, PCO2 slightly elevated at 61.6 with a bicarbonate of 31.5. There is no old for comparison. This is likely patient's  baseline and maybe secondary to her morbid obesity. Chest x-ray shows no obvious infiltrate but I think she has pneumonia clinically. We'll discuss with hospitalist for admission.    3:52 PM  D/w Dr Thedore MinsSingh for admission to inpt, stepdown. Patient is now much more awake, oriented x4.    EKG Interpretation  Date/Time:  Saturday October 16 2013 14:26:12 EDT Ventricular Rate:  100 PR Interval:  176 QRS Duration: 88 QT Interval:  348 QTC Calculation: 449 R Axis:   4 Text Interpretation:  Sinus tachycardia Low voltage, precordial leads Abnormal R-wave progression, early transition Repol abnrm suggests ischemia, anterolateral No significant change since last tracing 2011 Confirmed by WARD,  DO, KRISTEN [phone]([id number] on 10/16/2013 2:44:14 PM         CRITICAL CARE Performed by: Raelyn NumberWARD, KRISTEN N   Total critical care time: 45 minutes  Critical care time was exclusive of separately billable procedures and treating other patients.  Critical care was necessary to treat or prevent imminent or life-threatening deterioration.  Critical care was time spent personally by me on the following activities: development of treatment plan with patient and/or surrogate as well as nursing, discussions with consultants, evaluation of patient's response to treatment, examination of patient, obtaining history from patient or surrogate, ordering and performing treatments and interventions, ordering and review of laboratory studies, ordering and review of radiographic studies, pulse oximetry and re-evaluation of patient's condition.   Layla Maw Ward, DO 10/16/13 1553

## 2013-10-16 NOTE — Progress Notes (Signed)
ANTIBIOTIC CONSULT NOTE - INITIAL  Pharmacy Consult for aztreonam, levofloxacin, vancomycin Indication: sepsis  Allergies  Allergen Reactions  . Penicillins Other (See Comments)    Reaction=unknown    Patient Measurements: Body weight: 165.1kg IBW: 63.9kg Adjusted Body Weight: 104.4kg  Vital Signs: Temp: 99.2 F (37.3 C) (08/15 1414) Temp src: Oral (08/15 1414) BP: 121/61 mmHg (08/15 1414) Pulse Rate: 100 (08/15 1414)   Medical History: Past Medical History  Diagnosis Date  . MRSA cellulitis   . Psoriasis   . Obesities, morbid   . Sjogren's disease   . UTI (urinary tract infection)   . Antiphospholipid syndrome   . Diverticulosis   . Anxiety   . Depression   . Asthma   . Ventral hernia   . Diabetes mellitus   . Myasthenia gravis   . Myasthenia gravis   . Myasthenia gravis   . Back pain, chronic     missing some vertebra per pt  . Complication of anesthesia     can't have muscle relaxers -my. gravis  . Bipolar disorder, unspecified   . Seizures     Medications:  Scheduled:  . naLOXone (NARCAN)  injection  0.4 mg Intravenous Once   Infusions:  . sodium chloride    . sodium chloride    . aztreonam    . levofloxacin (LEVAQUIN) IV    . vancomycin     Assessment: [age] yoF admitted 8/15 from NH with fever, hypoxia, and AMS x 2 days PTA. Pt reported to be currently treated for URI with amoxicillin despite reported allergy to penicillins. Pharmacy has been consulted to dose aztreonam, levofloxacin, and vancomycin for presumed sepsis with HCAP source. No acute disease is seen on Xray. Noted that aztreonam 2g IV x1, levofloxacin 750mg  IV x1, and vancomycin 1g IV x1 all ordered in the ED.   8/15 >> vancomycin >> 8/15 >> aztreonam >>   8/15 >> levofloxacin >>  Tmax: 99.2 WBCs: 18.8 Renal: SCr 0.96, CrCl 93 ml/min CG, 64 ml/min normalized Lactic acid: 1.7  8/15 blood x2: collected 8/15 urine: ordered  8/15 R side abdominal fluid: ordered   Goal of  Therapy:  Vancomycin trough level 15-20 mcg/ml levofloxacin and aztreonam per indication and renal function  Plan:  - aztreonam 2g IV q8h - levofloxacin 750mg  IV q24h - vancomycin 2g IV x1 as a loading dose - vancomycin 1500mg  IV q12h per obesity nomogram - vancomycin trough at steady state if indicated - follow-up clinical course, culture results, renal function - follow-up antibiotic de-escalation and length of therapy  Thank you for the consult.  Ross LudwigJesse Durward Matranga Akers, PharmD, BCPS Pager: [phone] Pharmacy: [phone] 10/16/2013 3:26 PM

## 2013-10-16 NOTE — ED Notes (Signed)
Pt alert and oriented x4 at present time. Pt calm, comfortable, and resting in semi fowlers position with head to left for possible nausea. Pt denies nausea/pain at present time.

## 2013-10-16 NOTE — Progress Notes (Signed)
Pt unable to perform NIF and vital capacity.

## 2013-10-16 NOTE — ED Notes (Signed)
Hospitalist at bedside and requests O2 change to 3 lpm [place].

## 2013-10-16 NOTE — ED Notes (Signed)
IV infiltrated. MD and CN notified. CN attempting IV post portable xray who are at bedside now and MD reports will attempt US assisted IV.

## 2013-10-16 NOTE — Progress Notes (Signed)
Pt performed NIF and vital capacity 3 times each. With minimal coaching pt's best NIF was -30cmH2O and vital capacity was 4.4L.

## 2013-10-16 NOTE — ED Notes (Signed)
Bed: [id number] Expected date:  Expected time:  Means of arrival:  Comments: Possible sepsis

## 2013-10-16 NOTE — ED Notes (Signed)
Per RN on floor getting bariatric bed at present time. Will call staff when ready for transfer.

## 2013-10-16 NOTE — ED Notes (Signed)
MD at bedside. 

## 2013-10-16 NOTE — Progress Notes (Signed)
RN noticed pt becoming more lethargic and difficult to arouse. RT attempted to place pt on Bipap 2 times however pt refused. RN and RT explained the importance of Bipap but pt still refused. Pt prefers to wear 55% venti mask SpO2 88-92%.

## 2013-10-16 NOTE — H&P (Signed)
Patient Demographics  [name] Kaufmannleanor Clinkscale, is a 68 y.o. female  MRN: [id number]   DOB - 05/22/1945  Admit Date - 10/16/2013  Outpatient Primary MD for the patient is Pearson GrippeKIM, JAMES, MD   With History of -  Past Medical History  Diagnosis Date  . MRSA cellulitis   . Psoriasis   . Obesities, morbid   . Sjogren's disease   . UTI (urinary tract infection)   . Antiphospholipid syndrome   . Diverticulosis   . Anxiety   . Depression   . Asthma   . Ventral hernia   . Diabetes mellitus   . Myasthenia gravis   . Myasthenia gravis   . Myasthenia gravis   . Back pain, chronic     missing some vertebra per pt  . Complication of anesthesia     can't have muscle relaxers -my. gravis  . Bipolar disorder, unspecified   . Seizures       Past Surgical History  Procedure Laterality Date  . Abdominal hysterectomy    . Pilonidal cyst excision    . Cholecystectomy    . Appendectomy      in for   Chief Complaint  Patient presents with  . Altered Mental Status     HPI  [name] Kaufmannleanor Broski  is a 68 y.o. female, with history of myasthenia gravis, Sjogren's disease, diffuse psoriasis for which he does not take any medications, type 2 diabetes mellitus insulin-dependent, bipolar disorder, seizures, chronic bedbound state lives in nursing home, morbid obesity, depression anxiety, chronic pain on Dilaudid and methadone doses recently increased. Who reports to have had a productive cough for the last few days, running low grade fevers and was placed on azithromycin for URI 2 days ago at the nursing home, lately patient started to experience higher fevers, more shortness of breath and became lethargic, she was found to be profoundly hypoxic at the nursing home and then sent to the ER.  In the ER workup suggestive of chemical early  aspiration pneumonia HCAP, acute hypercapnic and hypoxic respiratory failure, narcotic overdose, respiratory acidosis, sepsis and I was called to admit the patient.    Review of Systems    In addition to the HPI above,   No Fever-chills, No Headache, No changes with Vision or hearing, No problems swallowing food or Liquids, No Chest pain, positive productive Cough and Shortness of Breath, No Abdominal pain, No Nausea or Vommitting, Bowel movements are regular, No Blood in stool or Urine, No dysuria, No new skin rashes or bruises, No new joints pains-aches,  No new weakness, tingling, numbness in any extremity, No recent weight gain or loss, No polyuria, polydypsia or polyphagia, No significant Mental Stressors.  A full 10 point Review of Systems was done, except as stated above, all other Review of Systems were negative.   Social History History  Substance Use Topics  . Smoking status: Former Games developermoker  . Smokeless tobacco: Never Used  . Alcohol Use:  No      Family History Family history negative for CAD at young age  Prior to Admission medications   Medication Sig Start Date End Date Taking? Authorizing Provider  acidophilus (RISAQUAD) CAPS Take 1 capsule by mouth 2 (two) times daily.     Yes Historical Provider, MD  albuterol (PROVENTIL HFA;VENTOLIN HFA) 108 (90 BASE) MCG/ACT inhaler Inhale 1-2 puffs into the lungs every 4 (four) hours as needed. shortness of breath \     Yes Historical Provider, MD  aspirin EC 81 MG tablet Take 81 mg by mouth daily.     Yes Historical Provider, MD  Biotin 2500 MCG CAPS Take 2,500 mcg by mouth daily.   Yes Historical Provider, MD  buPROPion (WELLBUTRIN XL) 150 MG 24 hr tablet Take 150 mg by mouth daily.     Yes Historical Provider, MD  cholecalciferol (VITAMIN D) 400 UNITS TABS Take 800 Units by mouth daily.     Yes Historical Provider, MD  diphenoxylate-atropine (LOMOTIL) 2.5-0.025 MG per tablet Take 2 tablets by mouth 4 (four) times  daily as needed for diarrhea or loose stools.   Yes Historical Provider, MD  FLUoxetine (PROZAC) 20 MG capsule Take 20 mg by mouth daily.   Yes Historical Provider, MD  furosemide (LASIX) 20 MG tablet Take 20 mg by mouth 2 (two) times daily.     Yes Historical Provider, MD  gabapentin (NEURONTIN) 600 MG tablet Take 600 mg by mouth 3 (three) times daily.   Yes Historical Provider, MD  glipiZIDE (GLUCOTROL) 10 MG tablet Take 10 mg by mouth daily before breakfast.   Yes Historical Provider, MD  HYDROmorphone (DILAUDID) 2 MG tablet Take 1 tablet (2 mg total) by mouth every 6 (six) hours as needed for pain. For pain 01/22/11  Yes Elease Etienne, MD  insulin aspart (NOVOLOG) 100 UNIT/ML injection Inject 0-10 Units into the skin 3 (three) times daily before meals. 0-149 0 units  150-200 2 units 201 -250 4 units 251-300 6 units  301-- 350 8 units 351-400 10 units  401 + call MD   Yes Historical Provider, MD  insulin glargine (LANTUS) 100 UNIT/ML injection Inject 45 Units into the skin at bedtime.   Yes Historical Provider, MD  lamoTRIgine (LAMICTAL) 25 MG tablet Take 75 mg by mouth 2 (two) times daily.    Yes Historical Provider, MD  methadone (DOLOPHINE) 10 MG tablet Take 1 tablet (10 mg total) by mouth 2 (two) times daily. 01/22/11  Yes Elease Etienne, MD  methylcellulose (ARTIFICIAL TEARS) 1 % ophthalmic solution Place 1 drop into both eyes daily as needed (dry eyes).    Yes Historical Provider, MD  nystatin cream (MYCOSTATIN) Apply 1 application topically 2 (two) times daily.   Yes Historical Provider, MD  omeprazole (PRILOSEC) 20 MG capsule Take 20 mg by mouth daily.     Yes Historical Provider, MD  promethazine (PHENERGAN) 25 MG tablet Take 25 mg by mouth every 6 (six) hours as needed for nausea or vomiting.   Yes Historical Provider, MD  psyllium (REGULOID) 0.52 G capsule Take 1.04 g by mouth 4 (four) times daily -  before meals and at bedtime.   Yes Historical Provider, MD  sodium chloride  (OCEAN) 0.65 % nasal spray Place 1 spray into the nose as needed for congestion. May keep at bedside   Yes Historical Provider, MD    Allergies  Allergen Reactions  . Penicillins Other (See Comments)    Reaction=unknown Has tolerated amoxicillin  Physical Exam  Vitals  Blood pressure 166/64, pulse 100, temperature 99.2 F (37.3 C), temperature source Oral, resp. rate 18, weight 165.109 kg (364 lb), SpO2 92.00%.   1. General middle-aged obese white female lying in bed in mildly somnolent,  2. Normal affect and insight, Not Suicidal or Homicidal, Awake Alert, Oriented X 3.  3. No F.N deficits, ALL C.Nerves Intact, Strength 5/5 all 4 extremities, Sensation intact all 4 extremities, Plantars down going.  4. Ears and Eyes appear Normal, Conjunctivae clear, PERRLA. Moist Oral Mucosa.  5. Supple Neck, No JVD, No cervical lymphadenopathy appriciated, No Carotid Bruits.  6. Symmetrical Chest wall movement, Good air movement bilaterally, coarse bibasilar breath sounds  7. RRR, No Gallops, Rubs or Murmurs, No Parasternal Heave.  8. Positive Bowel Sounds, Abdomen Soft, No tenderness, No organomegaly appriciated,No rebound -guarding or rigidity.  9.  No Cyanosis, Normal Skin Turgor, diffuse right rash all over with some skin breakdown in the right trunk and flank area  10. Good muscle tone,  joints appear normal , no effusions, Normal ROM.  11. No Palpable Lymph Nodes in Neck or Axillae     Data Review  CBC  Recent Labs Lab 10/16/13 1430  WBC 18.8*  HGB 12.4  HCT 40.7  PLT PLATELET CLUMPS NOTED ON SMEAR, COUNT APPEARS ADEQUATE  MCV 83.4  MCH 25.4*  MCHC 30.5  RDW 17.2*  LYMPHSABS 1.1  MONOABS 0.9  EOSABS 0.0  BASOSABS 0.0   ------------------------------------------------------------------------------------------------------------------  Chemistries   Recent Labs Lab 10/16/13 1430  NA 135*  K 4.5  CL 95*  CO2 28  GLUCOSE 143*  BUN 13  CREATININE 0.96    CALCIUM 10.0  AST 11  ALT 8  ALKPHOS 147*  BILITOT 0.4   ------------------------------------------------------------------------------------------------------------------ CrCl is unknown because both a height and weight (above a minimum accepted value) are required for this calculation. ------------------------------------------------------------------------------------------------------------------ No results found for this basename: TSH, T4TOTAL, FREET3, T3FREE, THYROIDAB,  in the last 72 hours   Coagulation profile No results found for this basename: INR, PROTIME,  in the last 168 hours ------------------------------------------------------------------------------------------------------------------- No results found for this basename: DDIMER,  in the last 72 hours -------------------------------------------------------------------------------------------------------------------  Cardiac Enzymes No results found for this basename: CK, CKMB, TROPONINI, MYOGLOBIN,  in the last 168 hours ------------------------------------------------------------------------------------------------------------------ No components found with this basename: POCBNP,    ---------------------------------------------------------------------------------------------------------------  Urinalysis    Component Value Date/Time   COLORURINE YELLOW 12/01/2009 1403   APPEARANCEUR CLEAR 12/01/2009 1403   LABSPEC 1.010 12/01/2009 1403   PHURINE 6.0 12/01/2009 1403   GLUCOSEU NEGATIVE 12/01/2009 1403   HGBUR NEGATIVE 12/01/2009 1403   BILIRUBINUR NEGATIVE 12/01/2009 1403   KETONESUR NEGATIVE 12/01/2009 1403   PROTEINUR NEGATIVE 12/01/2009 1403   UROBILINOGEN 0.2 12/01/2009 1403   NITRITE NEGATIVE 12/01/2009 1403   LEUKOCYTESUR MODERATE* 12/01/2009 1403    ----------------------------------------------------------------------------------------------------------------  Imaging results:   Dg Chest Port 1  View  10/16/2013   CLINICAL DATA:  Sepsis, hypoxia.  EXAM: PORTABLE CHEST - 1 VIEW  COMPARISON:  Single view of the chest 12/02/2009.  FINDINGS: There is unchanged mild elevation of the right hemidiaphragm relative to the left. Lungs are clear. Heart size is upper normal. No pneumothorax or pleural effusion.  IMPRESSION: No acute disease.   Electronically Signed   By: Drusilla Kanner M.D.   On: 10/16/2013 15:04         Assessment & Plan   1. Acute hypercapnic hypoxic respiratory failure secondary to aspiration pneumonia (HCAP) - Sepsis likely  due to the increased doses of narcotics done recently at nursing home.   She will be admitted to step down unit, after Narcan she is much more alert awake, will give her an extra dose Narcan and then when necessary as needed, blood and sputum cultures, empiric meropenem and vancomycin to be dosed by pharmacy, oxygen supplementation may require short-term BiPAP although at this time she mentation has improved, nebulizer treatments and pulmonary toiletry, liquid diet for now, aspiration precautions, feeding assistance, dysphagia 3 diet for now. Speech therapy evaluation. Monitor closely in step down unit. Will check UA with culture as well.    2. Accidental narcotic overdose. Recently narcotic doses were increased, I would hold Dilaudid and reduce methadone dose from tomorrow, currently Narcan repeated in the ER, as needed Narcan will be ordered due to the long half-life of methadone. Supportive care as in #1 above.    3. History of myasthenia gravis. No new focal deficits, she is chronically bedbound but strength is 5/5 in all 4 extremities on my exam, will monitor NIF, outpatient neurology followup.    4. DM type II. Check A1c, continue Lantus and Glucotrol at lower than home dose we'll add sliding scale.     5. Diffuse psoriatic rash. Wound care consult and steroid cream. Outpatient pathology followup.    6. GERD. Continue PPI.    7.  Depression continue Prozac.    8. History of seizures - on Lamictal and Neurontin which will be continued.      DVT Prophylaxis Heparin    AM Labs Ordered, also please review Full Orders  Family Communication: Admission, patients condition and plan of care including tests being ordered have been discussed with the patient who indicates understanding and agree with the plan and Code Status.  Code Status Full  Likely DC to  SNF  Condition GUARDED     Time spent in minutes : 35    Chamari Cutbirth K M.D on 10/16/2013 at 4:13 PM  Between 7am to 7pm - Pager - [phone]  After 7pm go to [email or web address] - password [email or web address]  And look for the night coverage person covering me after hours  [facility] Office  [phone]   **Disclaimer: This note may have been dictated with voice recognition software. Similar sounding words can inadvertently be transcribed and this note may contain transcription errors which may not have been corrected upon publication of note.**

## 2013-10-16 NOTE — Progress Notes (Signed)
Received a call from patients nursing home to let staff know that patient is a DNR. Nursing home staff will be bringing DNR form in the morning. While the patient was alert I confirmed that patient wished to be a DNR and also does not want to be intubated. I made the on call physician aware and she placed a DNR order in the chart.  She is currently refusing Bi-pap as well.

## 2013-10-17 DIAGNOSIS — R509 Fever, unspecified: Secondary | ICD-10-CM

## 2013-10-17 DIAGNOSIS — R0902 Hypoxemia: Secondary | ICD-10-CM

## 2013-10-17 DIAGNOSIS — J9600 Acute respiratory failure, unspecified whether with hypoxia or hypercapnia: Secondary | ICD-10-CM

## 2013-10-17 LAB — GLUCOSE, CAPILLARY
GLUCOSE-CAPILLARY: 105 mg/dL — AB (ref 70–99)
GLUCOSE-CAPILLARY: 61 mg/dL — AB (ref 70–99)
GLUCOSE-CAPILLARY: 208 mg/dL — AB (ref 70–99)
Glucose-Capillary: 55 mg/dL — ABNORMAL LOW (ref 70–99)
Glucose-Capillary: 77 mg/dL (ref 70–99)
Glucose-Capillary: 79 mg/dL (ref 70–99)
Glucose-Capillary: 126 mg/dL — ABNORMAL HIGH (ref 70–99)
Glucose-Capillary: 98 mg/dL (ref 70–99)

## 2013-10-17 LAB — CBC
HCT: 36 % (ref 36.0–46.0)
HEMOGLOBIN: 11.1 g/dL — AB (ref 12.0–15.0)
MCH: 25.6 pg — ABNORMAL LOW (ref 26.0–34.0)
MCHC: 30.8 g/dL (ref 30.0–36.0)
MCV: 83.1 fL (ref 78.0–100.0)
Platelets: 258 10*3/uL (ref 150–400)
RBC: 4.33 MIL/uL (ref 3.87–5.11)
RDW: 17.1 % — ABNORMAL HIGH (ref 11.5–15.5)
WBC: 16.5 10*3/uL — ABNORMAL HIGH (ref 4.0–10.5)

## 2013-10-17 LAB — BASIC METABOLIC PANEL
Anion gap: 9 (ref 5–15)
BUN: 12 mg/dL (ref 6–23)
CHLORIDE: 99 meq/L (ref 96–112)
CO2: 29 mEq/L (ref 19–32)
Calcium: 9.1 mg/dL (ref 8.4–10.5)
Creatinine, Ser: 0.89 mg/dL (ref 0.50–1.10)
GFR calc Af Amer: 76 mL/min — ABNORMAL LOW (ref >90)
GFR calc non Af Amer: 66 mL/min — ABNORMAL LOW (ref >90)
GLUCOSE: 44 mg/dL — AB (ref 70–99)
POTASSIUM: 3.8 meq/L (ref 3.7–5.3)
Sodium: 137 mEq/L (ref 137–147)

## 2013-10-17 LAB — HEMOGLOBIN A1C
HEMOGLOBIN A1C: 5.7 % — AB (ref <5.7)
Mean Plasma Glucose: 117 mg/dL — ABNORMAL HIGH (ref <117)

## 2013-10-17 LAB — EXPECTORATED SPUTUM ASSESSMENT W REFEX TO RESP CULTURE

## 2013-10-17 LAB — EXPECTORATED SPUTUM ASSESSMENT W GRAM STAIN, RFLX TO RESP C

## 2013-10-17 LAB — STREP PNEUMONIAE URINARY ANTIGEN: Strep Pneumo Urinary Antigen: NEGATIVE

## 2013-10-17 MED ORDER — METHYLPREDNISOLONE SODIUM SUCC 125 MG IJ SOLR
60.0000 mg | Freq: Two times a day (BID) | INTRAMUSCULAR | Status: DC
  Administered 2013-10-17: 62.5 mg via INTRAVENOUS
  Administered 2013-10-17 – 2013-10-19 (×4): 60 mg via INTRAVENOUS
  Filled 2013-10-17: qty 0.96
  Filled 2013-10-17 (×2): qty 2
  Filled 2013-10-17 (×2): qty 0.96
  Filled 2013-10-17: qty 2

## 2013-10-17 MED ORDER — DEXTROSE 50 % IV SOLN
25.0000 mL | Freq: Once | INTRAVENOUS | Status: AC | PRN
Start: 2013-10-17 — End: 2013-10-17
  Administered 2013-10-17: 25 mL via INTRAVENOUS

## 2013-10-17 MED ORDER — CETYLPYRIDINIUM CHLORIDE 0.05 % MT LIQD
7.0000 mL | Freq: Two times a day (BID) | OROMUCOSAL | Status: DC
  Administered 2013-10-17 – 2013-10-21 (×5): 7 mL via OROMUCOSAL

## 2013-10-17 MED ORDER — GUAIFENESIN ER 600 MG PO TB12
600.0000 mg | ORAL_TABLET | Freq: Two times a day (BID) | ORAL | Status: DC
  Administered 2013-10-17 – 2013-10-21 (×9): 600 mg via ORAL
  Filled 2013-10-17 (×10): qty 1

## 2013-10-17 MED ORDER — BUDESONIDE 0.25 MG/2ML IN SUSP
0.2500 mg | Freq: Two times a day (BID) | RESPIRATORY_TRACT | Status: DC
Start: 2013-10-17 — End: 2013-10-21
  Administered 2013-10-17 – 2013-10-21 (×7): 0.25 mg via RESPIRATORY_TRACT
  Filled 2013-10-17 (×9): qty 2

## 2013-10-17 MED ORDER — INSULIN GLARGINE 100 UNIT/ML ~~LOC~~ SOLN
20.0000 [IU] | Freq: Every day | SUBCUTANEOUS | Status: DC
  Administered 2013-10-17 – 2013-10-18 (×2): 20 [IU] via SUBCUTANEOUS
  Filled 2013-10-17 (×2): qty 0.2

## 2013-10-17 MED ORDER — PNEUMOCOCCAL VAC POLYVALENT 25 MCG/0.5ML IJ INJ
0.5000 mL | INJECTION | INTRAMUSCULAR | Status: AC
  Administered 2013-10-18: 0.5 mL via INTRAMUSCULAR
  Filled 2013-10-17 (×2): qty 0.5

## 2013-10-17 MED ORDER — CHLORHEXIDINE GLUCONATE 0.12 % MT SOLN
15.0000 mL | Freq: Two times a day (BID) | OROMUCOSAL | Status: DC
  Administered 2013-10-17 – 2013-10-21 (×7): 15 mL via OROMUCOSAL
  Filled 2013-10-17 (×11): qty 15

## 2013-10-17 MED ORDER — FUROSEMIDE 10 MG/ML IJ SOLN
20.0000 mg | Freq: Every day | INTRAMUSCULAR | Status: DC
  Administered 2013-10-17 – 2013-10-21 (×5): 20 mg via INTRAVENOUS
  Filled 2013-10-17 (×5): qty 2

## 2013-10-17 MED ORDER — IPRATROPIUM-ALBUTEROL 0.5-2.5 (3) MG/3ML IN SOLN
3.0000 mL | Freq: Four times a day (QID) | RESPIRATORY_TRACT | Status: DC
  Administered 2013-10-17 – 2013-10-18 (×5): 3 mL via RESPIRATORY_TRACT
  Filled 2013-10-17 (×4): qty 3

## 2013-10-17 MED ORDER — DEXTROSE 50 % IV SOLN
INTRAVENOUS | Status: AC
  Filled 2013-10-17: qty 50

## 2013-10-17 NOTE — Progress Notes (Signed)
08:00 10/17/2013 PT CGB @ 61.  PT provided 4 ounces orange juice to stabilize blood sugar.  Will recheck CBG in 15 minutes per protocol.

## 2013-10-17 NOTE — Progress Notes (Addendum)
[facility] PROGRESS NOTE  [name]:[id number] DOB: [birth year] DOA: 10/16/2013 PCP: Pearson Grippe, MD  Assessment/Plan: 1-acute resp failure with hypoxia: secondary to COPD exacerbation/HCAP vs aspiration PNA. -continue oxygen supplementation -start solumedrol and pulmicort -continue PRN nebulizer treatments -continue antibiotics (vanc and aztreonam) -continue mucinex, flutter valve and supportive care -imagine component of OHS also contributing to SOB  2-COPD: former heavy smoker; no formal PFT's available.  -will continue albuterol-atrovent nebulizer and pulmicort  -continue antibiotics -started on flutter valve -follow clinical response  3-encephalopathy: secondary to accidental narcotic overdose. -resolved with narcan  4-History of myasthenia gravis. No new focal deficits, she is chronically bedbound but strength is 5/5 in all 4 extremities on my exam, will monitor NIF, outpatient neurology followup.  5-type 2 diabetes: well controlled. With neuropathy -a1C 5.7 -CBG's will be elevated due to steroids -continue glipizide, SSI and lantus  6-psoriasis: will continue clobetasol  7-diabetic neuropathy: continue nuerontin   8-chronic pain: continue methadone (reduced dose) and vicodin for breakthrough  9-GERD: continue PPI  10-sepsis due to WJX:[id number] as mentioned on #1. -sepsis parameters improving/resolving -no fever   DVT: heparin   Code Status: DNR Family Communication: no family at bedside Disposition Plan: to be determine    Consultants:  None   Procedures:  See below for x-ray reports  Antibiotics:  Vancomycin   Aztreonam   HPI/Subjective: Afebrile, patient is oriented X 2; reports is breathing better, but still easily desaturating as soon as she removed venturi mask. Patient is refusing BIPAP and has endorses do not want to be intubated.  Objective: Filed Vitals:   10/17/13 1300  BP:   Pulse: 82  Temp:   Resp: 15     Intake/Output Summary (Last 24 hours) at 10/17/13 1437 Last data filed at 10/17/13 1300  Gross per 24 hour  Intake 3643.33 ml  Output    175 ml  Net 3468.33 ml   Filed Weights   10/16/13 1500 10/16/13 1800 10/17/13 0434  Weight: 165.109 kg (364 lb) 143.1 kg (315 lb 7.7 oz) 145.1 kg (319 lb 14.2 oz)    Exam:   General:  Afebrile, still SOB and with desaturation as soon as she stops wearing venturi mask  Cardiovascular:S1 and S2, no rubs or gallops  Respiratory:diffuse wheezing and rhonchi; bibasilar crackles also appreciated  Abdomen: soft, NT, ND, positive BS, positive ventral hernia. Abdominal skin with active serosanguineous discharge on right flank from scalded psoriatic lesions  Musculoskeletal:1+edema bilaterally, no cyanosis  Skin: multiple areas with psoriatic lesions  Data Reviewed: Basic Metabolic Panel:  Recent Labs Lab 10/16/13 1430 10/17/13 0404  NA 135* 137  K 4.5 3.8  CL 95* 99  CO2 28 29  GLUCOSE 143* 44*  BUN 13 12  CREATININE 0.96 0.89  CALCIUM 10.0 9.1   Liver Function Tests:  Recent Labs Lab 10/16/13 1430  AST 11  ALT 8  ALKPHOS 147*  BILITOT 0.4  PROT 6.9  ALBUMIN 2.7*   CBC:  Recent Labs Lab 10/16/13 1430 10/17/13 0404  WBC 18.8* 16.5*  NEUTROABS 16.8*  --   HGB 12.4 11.1*  HCT 40.7 36.0  MCV 83.4 83.1  PLT PLATELET CLUMPS NOTED ON SMEAR, COUNT APPEARS ADEQUATE 258   CBG:  Recent Labs Lab 10/16/13 2201 10/17/13 0504 10/17/13 0533  GLUCAP 109* 55* 105*    Recent Results (from the past 240 hour(s))  CULTURE, BLOOD (ROUTINE X 2)     Status: None   Collection Time    10/16/13  2:30  PM      Result Value Ref Range Status   Specimen Description BLOOD LEFT ARM   Final   Special Requests BOTTLES DRAWN AEROBIC AND ANAEROBIC 3.5CC EACH   Final   Culture  Setup Time     Final   Value: 10/16/2013 18:00     Performed at [facility]   Culture     Final   Value:        BLOOD CULTURE RECEIVED NO GROWTH TO DATE  CULTURE WILL BE HELD FOR 5 DAYS BEFORE ISSUING A FINAL NEGATIVE REPORT     Performed at [facility]   Report Status PENDING   Incomplete  CULTURE, BLOOD (ROUTINE X 2)     Status: None   Collection Time    10/16/13  2:30 PM      Result Value Ref Range Status   Specimen Description BLOOD RIGHT ANTECUBITAL   Final   Special Requests BOTTLES DRAWN AEROBIC ONLY 2CC   Final   Culture  Setup Time     Final   Value: 10/16/2013 18:01     Performed at [facility]   Culture     Final   Value:        BLOOD CULTURE RECEIVED NO GROWTH TO DATE CULTURE WILL BE HELD FOR 5 DAYS BEFORE ISSUING A FINAL NEGATIVE REPORT     Performed at [facility]   Report Status PENDING   Incomplete  MRSA PCR SCREENING     Status: None   Collection Time    10/16/13  6:15 PM      Result Value Ref Range Status   MRSA by PCR NEGATIVE  NEGATIVE Final   Comment:            The GeneXpert MRSA Assay (FDA     approved for NASAL specimens     only), is one component of a     comprehensive MRSA colonization     surveillance program. It is not     intended to diagnose MRSA     infection nor to guide or     monitor treatment for     MRSA infections.  CULTURE, EXPECTORATED SPUTUM-ASSESSMENT     Status: None   Collection Time    10/17/13 12:37 PM      Result Value Ref Range Status   Specimen Description SPUTUM   Final   Special Requests NONE   Final   Sputum evaluation     Final   Value: THIS SPECIMEN IS ACCEPTABLE. RESPIRATORY CULTURE REPORT TO FOLLOW.   Report Status 10/17/2013 FINAL   Final     Studies: Dg Chest Port 1 View  10/16/2013   CLINICAL DATA:  Sepsis, hypoxia.  EXAM: PORTABLE CHEST - 1 VIEW  COMPARISON:  Single view of the chest 12/02/2009.  FINDINGS: There is unchanged mild elevation of the right hemidiaphragm relative to the left. Lungs are clear. Heart size is upper normal. No pneumothorax or pleural effusion.  IMPRESSION: No acute disease.   Electronically Signed   By: Drusilla Kannerhomas   Dalessio M.D.   On: 10/16/2013 15:04    Scheduled Meds: . acidophilus  1 capsule Oral BID  . antiseptic oral rinse  7 mL Mouth Rinse q12n4p  . aspirin EC  81 mg Oral Daily  . aztreonam  2 g Intravenous Q8H  . budesonide (PULMICORT) nebulizer solution  0.25 mg Nebulization BID  . chlorhexidine  15 mL Mouth Rinse BID  . clobetasol cream  Topical BID  . FLUoxetine  20 mg Oral Daily  . furosemide  20 mg Intravenous Daily  . gabapentin  600 mg Oral TID  . glipiZIDE  5 mg Oral QAC breakfast  . heparin  5,000 Units Subcutaneous 3 times per day  . insulin aspart  0-9 Units Subcutaneous TID WC  . insulin glargine  20 Units Subcutaneous QHS  . lamoTRIgine  75 mg Oral BID  . methadone  5 mg Oral BID  . methylPREDNISolone (SOLU-MEDROL) injection  60 mg Intravenous Q12H  . pantoprazole  40 mg Oral Daily  . sodium chloride  3 mL Intravenous Q12H  . vancomycin  1,500 mg Intravenous Q12H   Continuous Infusions:   Principal Problem:   Aspiration pneumonia (HCAP) Active Problems:   Psoriasis (a type of skin inflammation)   Diabetes mellitus   Obesity (BMI 30-39.9)   Myasthenia gravis   Antiphospholipid syndrome   Sepsis   Acute respiratory failure    Time spent: >30 minutes    Vassie Loll  [facility] Pager [phone]. If 7PM-7AM, please contact night-coverage at [email or web address], password [facility] 10/17/2013, 2:37 PM  LOS: 1 day

## 2013-10-17 NOTE — Progress Notes (Signed)
08:15  10/17/2013  PT CBG re-checked at 08:15.  Value at 79.  Will continue to monitor.

## 2013-10-17 NOTE — Progress Notes (Addendum)
PT continues to voluntarily remove venturi mask resulting in O2 desaturation levels between 70-80%. Patient is also refusing Bipap. Patient is oriented X4.  Patient has been re-educated as to the circumstances surrounding the importance of adherence to O2 therapy. Patient educated on life saving measures of Bipap. Dr. Gwenlyn PerkingMadera is aware of the aforementioned concerns. Charge Nurse aware of situation. Will continue to monitor. Will continue to reinforce and educate on importance of oxygen usage.

## 2013-10-17 NOTE — Progress Notes (Signed)
NIF -30 FVC 3.0 L good patient effort.

## 2013-10-17 NOTE — Progress Notes (Signed)
NIF -38cmH2O Vital Capacity 3.8L with minimal coaching and good patient effort. NIF and vital capacity were done 3 times each best out of 3 documented.

## 2013-10-17 NOTE — Consult Note (Signed)
WOC wound consult note Reason for Consult: Diffuse psoriatic outbreak with cellulitis and partial thickness skin loss. Patient is scratching at peeling and flaking skin during my examination. Wound type: Autoimmune flare Pressure Ulcer POA: No Measurement: Diffuse, total body presentation involving the legs, feet, arms, hands, abdomen, flank and buttocks. Some intertriginous dermatitis that is consistent with fungal overgrowth secondary to moisture accumulation Wound bed: Plaque presentation with scaling and flaking. . Drainage (amount, consistency, odor) Scant serous Periwound: Asdescribed above Dressing procedure/placement/frequency:It should be noted that extreme cases of dermatitis are outside the scope of WOC nursing.  That said, there are some interventions that I am abel to employ to assist the patient and the medical and nursing teams.  Subsequently, I am adding a therapeutic mattress replacement with low air loss feature in a Bariatric size for her comfort and therapeutic contribution.  Additionally, I will provide an antimicrobial textile (InterDry Ag+) for the intertriginous areas in the inframammary areas, beneath the pannus and at the groin.  I am unable to determine if the dermatitis there is fungal or psoriatic in nature, but the silver component of the textile will positively affect either/both.  Finally, the patient states that she has had some effect in the past with Eucerin/triamcinolone cream provided in a much larger delivery model that we have here presently i.e. a one-pound "tub" for more generous application over the large body surface area affected. It may be of interest that there have been several patients I have shared with dermatology  in the past where the topical creams are followed by an order for the wrapping of the extremities with plastic wrap enhance the action and penetration.  I regret that I have little more to offer at this time and recommend consultation with a  dermatologist for additional suggestions as patient reports that she is currently not under a dermatologist's care. If you agree, please order. WOC nursing team will not follow, but will remain available to this patient, the nursing and medical teams.  Thanks, Ladona MowLaurie Edeline Greening, MSN, RN, GNP, PinnacleWOCN, CWON-AP [phone])

## 2013-10-18 DIAGNOSIS — A4102 Sepsis due to Methicillin resistant Staphylococcus aureus: Secondary | ICD-10-CM | POA: Diagnosis not present

## 2013-10-18 LAB — BASIC METABOLIC PANEL
Anion gap: 7 (ref 5–15)
BUN: 10 mg/dL (ref 6–23)
CALCIUM: 9 mg/dL (ref 8.4–10.5)
CO2: 30 meq/L (ref 19–32)
CREATININE: 0.69 mg/dL (ref 0.50–1.10)
Chloride: 101 mEq/L (ref 96–112)
GFR calc Af Amer: >90 mL/min (ref >90)
GFR, EST NON AFRICAN AMERICAN: 88 mL/min — AB (ref >90)
GLUCOSE: 191 mg/dL — AB (ref 70–99)
Potassium: 4.1 mEq/L (ref 3.7–5.3)
SODIUM: 138 meq/L (ref 137–147)

## 2013-10-18 LAB — GLUCOSE, CAPILLARY
GLUCOSE-CAPILLARY: 184 mg/dL — AB (ref 70–99)
Glucose-Capillary: 162 mg/dL — ABNORMAL HIGH (ref 70–99)
Glucose-Capillary: 222 mg/dL — ABNORMAL HIGH (ref 70–99)

## 2013-10-18 LAB — URINE CULTURE
CULTURE: NO GROWTH
Colony Count: NO GROWTH

## 2013-10-18 LAB — VANCOMYCIN, TROUGH: VANCOMYCIN TR: 20.6 ug/mL — AB (ref 10.0–20.0)

## 2013-10-18 LAB — CBC
HCT: 35.7 % — ABNORMAL LOW (ref 36.0–46.0)
Hemoglobin: 10.7 g/dL — ABNORMAL LOW (ref 12.0–15.0)
MCH: 25.1 pg — AB (ref 26.0–34.0)
MCHC: 30 g/dL (ref 30.0–36.0)
MCV: 83.6 fL (ref 78.0–100.0)
Platelets: 206 10*3/uL (ref 150–400)
RBC: 4.27 MIL/uL (ref 3.87–5.11)
RDW: 17.2 % — ABNORMAL HIGH (ref 11.5–15.5)
WBC: 11.4 10*3/uL — ABNORMAL HIGH (ref 4.0–10.5)

## 2013-10-18 LAB — LEGIONELLA ANTIGEN, URINE: Legionella Antigen, Urine: NEGATIVE

## 2013-10-18 MED ORDER — GLUCERNA SHAKE PO LIQD
237.0000 mL | Freq: Two times a day (BID) | ORAL | Status: DC
  Administered 2013-10-18 – 2013-10-21 (×2): 237 mL via ORAL
  Filled 2013-10-18 (×7): qty 237

## 2013-10-18 MED ORDER — IPRATROPIUM-ALBUTEROL 0.5-2.5 (3) MG/3ML IN SOLN
3.0000 mL | Freq: Three times a day (TID) | RESPIRATORY_TRACT | Status: DC
  Administered 2013-10-19 – 2013-10-20 (×4): 3 mL via RESPIRATORY_TRACT
  Filled 2013-10-18 (×4): qty 3

## 2013-10-18 MED ORDER — VANCOMYCIN HCL 10 G IV SOLR
1250.0000 mg | Freq: Two times a day (BID) | INTRAVENOUS | Status: AC
  Administered 2013-10-18 – 2013-10-19 (×3): 1250 mg via INTRAVENOUS
  Filled 2013-10-18 (×3): qty 1250

## 2013-10-18 MED ORDER — ALBUTEROL SULFATE (2.5 MG/3ML) 0.083% IN NEBU: 2.5000 mg | INHALATION_SOLUTION | Freq: Four times a day (QID) | RESPIRATORY_TRACT | Status: DC | PRN

## 2013-10-18 NOTE — Progress Notes (Signed)
NIF -32, FVC 2.8l best of three effforts

## 2013-10-18 NOTE — Progress Notes (Signed)
ANTIBIOTIC CONSULT NOTE - FOLLOW UP  Pharmacy Consult for aztreonam, vancomycin Indication: sepsis  Allergies  Allergen Reactions  . Penicillins Other (See Comments)    Reaction=unknown Has tolerated amoxicillin    Patient Measurements: Height: 5\' 8"  (172.7 cm) Weight: 313 lb (141.976 kg) IBW/kg (Calculated) : 63.9  Vital Signs: Temp: 98.1 F (36.7 C) (08/17 1230) Temp src: Oral (08/17 1230) BP: 106/43 mmHg (08/17 1230) Pulse Rate: 66 (08/17 1230) Intake/Output from previous day: 08/16 0701 - 08/17 0700 In: 2380 [P.O.:480; I.V.:700; IV Piggyback:1200] Out: 2100 [Urine:2100] Intake/Output from this shift: Total I/O In: 240 [P.O.:240] Out: 1250 [Urine:1250]  Labs:  Recent Labs  10/16/13 1430 10/17/13 0404 10/18/13 0355  WBC 18.8* 16.5* 11.4*  HGB 12.4 11.1* 10.7*  PLT PLATELET CLUMPS NOTED ON SMEAR, COUNT APPEARS ADEQUATE 258 206  CREATININE 0.96 0.89 0.69   Estimated Creatinine Clearance: 102.4 ml/min (by C-G formula based on Cr of 0.69).  Recent Labs  10/18/13 1524  VANCOTROUGH 20.6*     Assessment: 67 yoF admitted 8/15 from NH with fever, hypoxia, and AMS x 2 days PTA. Pt reported to be currently treated for URI with amoxicillin despite reported allergy to penicillins. Pharmacy has been consulted to dose aztreonam, levofloxacin, and vancomycin for presumed sepsis with HCAP source. No acute disease is seen on Xray.  Noted that aztreonam 2g IV x1, levofloxacin 750mg  IV x1, and vancomycin 2g IV x1 all ordered in the ED.  8/15 >> vancomycin >> 8/15 >> aztreonam >>  8/15 >> levofloxacin x 1  Tmax: 100.1 WBCs: improving Renal: SCr 0.69, CrCl >100 ml/min (CG), 76 ml/min (normalized) Lactic acid: wnl  8/15 MRSA screen neg 8/15 blood x2: ngtd 8/15 urine: NGF 8/15 R side abdominal fluid: ordered  Today is day #3 aztreonam 2g IV q8h and Vanc 2g then 1500mg  q12h (per obesity nomogram) for sepsis with presumed pulmonary source (HCAP vs aspiration PNA vs  AECOPD).   Goal of Therapy:  Vancomycin trough level 15-20 mcg/ml Eradication of infection Doses adjusted per renal clearance  Plan:  1.  Obtain vancomycin trough this afternoon.  Will adjust dose per result. 2.  F/u culture results, SCr, narrowing of antibiotics.  ADDENDUM:  Vancomycin trough = 20.6, slightly supratherapeutic. Reduce vancomycin to 1250mg  q12h as trough concentration likely will continue to rise overtime.  F/u renal fxn, clinical course.   Haynes Hoehnolleen Letcher Schweikert, PharmD, BCPS 10/18/2013, 4:58 PM  Pager: [phone]

## 2013-10-18 NOTE — Progress Notes (Signed)
[facility] PROGRESS NOTE  [name] ZOX:[id number] DOB: December 03, 1945 DOA: 10/16/2013 PCP: Pearson Grippe, MD  Assessment/Plan: 1-acute resp failure with hypoxia: secondary to COPD exacerbation/HCAP vs aspiration PNA. -continue oxygen supplementation -continue solumedrol and pulmicort; patient has responded well to treatment. Breathing easier and less wheezing. -continue PRN nebulizer treatments -continue antibiotics (vanc and aztreonam) -continue mucinex, flutter valve and supportive care -imagine component of OHS also contributing to SOB  2-COPD: former heavy smoker; no formal PFT's available.  -will continue albuterol-atrovent nebulizer and pulmicort  -continue antibiotics -started on flutter valve -follow clinical response  3-encephalopathy: secondary to accidental narcotic overdose. -resolved with narcan -Patient has remained alert, awake and oriented to time, place and person.  4-History of myasthenia gravis. No new focal deficits, she is chronically bedbound but strength is 5/5 in all 4 extremities on my exam, will monitor NIF, outpatient neurology followup.  5-type 2 diabetes: well controlled. With neuropathy -a1C 5.7 -CBG's will be elevated due to steroids -continue glipizide, SSI and lantus (dose adjusted secondary to hypoglycemic event on 10/17/2013). Will continue monitoring CBGs and adjust insulin as needed. We'll anticipate no further lower sugars with the use of Solu-Medrol now.  6-psoriasis: will continue clobetasol; Solu-Medrol initiated for patient's COPD will also help psoriatic lesions.  7-diabetic neuropathy: continue nuerontin   8-chronic pain: continue methadone (reduced dose) and vicodin for breakthrough  9-GERD: continue PPI  10-sepsis due to PNA: treatment as mentioned on #1. -sepsis parameters essentially resolved -no fever   DVT: heparin   Code Status: DNR Family Communication: no family at bedside Disposition Plan: Will transfer to a  MedSurg bed   Consultants:  None   Procedures:  See below for x-ray reports  Antibiotics:  Vancomycin   Aztreonam   HPI/Subjective: Afebrile, still SOB and with intermittent desaturation, but overall breathing easier and with less wheezing. Patient has decided not to use BiPAP in case her breathing compromises   Objective: Filed Vitals:   10/18/13 0800  BP: 127/48  Pulse: 73  Temp: 98 F (36.7 C)  Resp: 18    Intake/Output Summary (Last 24 hours) at 10/18/13 0925 Last data filed at 10/18/13 0700  Gross per 24 hour  Intake   1770 ml  Output   2055 ml  Net   -285 ml   Filed Weights   10/16/13 1800 10/17/13 0434 10/18/13 0402  Weight: 143.1 kg (315 lb 7.7 oz) 145.1 kg (319 lb 14.2 oz) 141.976 kg (313 lb)    Exam:   General:  Afebrile, still SOB and with intermittent desaturation, but overall breathing easier and with less wheezing.  Cardiovascular:S1 and S2, no rubs or gallops  Respiratory: Improved air movement, diffuse rhonchi, mild wheeze and an scattered crackles at the bases. Abdomen: soft, NT, ND, positive BS, positive ventral hernia. Abdominal skin with active serosanguineous discharge on right flank from scalded psoriatic lesions  Musculoskeletal: 1+edema bilaterally, no cyanosis  Skin: multiple areas with psoriatic lesions  Data Reviewed: Basic Metabolic Panel:  Recent Labs Lab 10/16/13 1430 10/17/13 0404 10/18/13 0355  NA 135* 137 138  K 4.5 3.8 4.1  CL 95* 99 101  CO2 28 29 30   GLUCOSE 143* 44* 191*  BUN 13 12 10   CREATININE 0.96 0.89 0.69  CALCIUM 10.0 9.1 9.0   Liver Function Tests:  Recent Labs Lab 10/16/13 1430  AST 11  ALT 8  ALKPHOS 147*  BILITOT 0.4  PROT 6.9  ALBUMIN 2.7*   CBC:  Recent Labs Lab 10/16/13 1430 10/17/13  0404 10/18/13 0355  WBC 18.8* 16.5* 11.4*  NEUTROABS 16.8*  --   --   HGB 12.4 11.1* 10.7*  HCT 40.7 36.0 35.7*  MCV 83.4 83.1 83.6  PLT PLATELET CLUMPS NOTED ON SMEAR, COUNT APPEARS ADEQUATE  258 206   CBG:  Recent Labs Lab 10/17/13 0822 10/17/13 0853 10/17/13 1222 10/17/13 1558 10/17/13 2155  GLUCAP 61* 79 126* 98 208*    Recent Results (from the past 240 hour(s))  CULTURE, BLOOD (ROUTINE X 2)     Status: None   Collection Time    10/16/13  2:30 PM      Result Value Ref Range Status   Specimen Description BLOOD LEFT ARM   Final   Special Requests BOTTLES DRAWN AEROBIC AND ANAEROBIC 3.5CC EACH   Final   Culture  Setup Time     Final   Value: 10/16/2013 18:00     [facility]   Culture     Final   Value:        BLOOD CULTURE RECEIVED NO GROWTH TO DATE CULTURE WILL BE HELD FOR 5 DAYS BEFORE ISSUING A FINAL NEGATIVE REPORT     Performed at [facility]   Report Status PENDING   Incomplete  CULTURE, BLOOD (ROUTINE X 2)     Status: None   Collection Time    10/16/13  2:30 PM      Result Value Ref Range Status   Specimen Description BLOOD RIGHT ANTECUBITAL   Final   Special Requests BOTTLES DRAWN AEROBIC ONLY 2CC   Final   Culture  Setup Time     Final   Value: 10/16/2013 18:01     Performed at [facility]   Culture     Final   Value:        BLOOD CULTURE RECEIVED NO GROWTH TO DATE CULTURE WILL BE HELD FOR 5 DAYS BEFORE ISSUING A FINAL NEGATIVE REPORT     Performed at [facility]   Report Status PENDING   Incomplete  URINE CULTURE     Status: None   Collection Time    10/16/13  4:45 PM      Result Value Ref Range Status   Specimen Description URINE, CATHETERIZED   Final   Special Requests NONE   Final   Culture  Setup Time     Final   Value: 10/16/2013 22:03     Performed at [facility]   Colony Count     Final   Value: NO GROWTH     Performed at [facility]   Culture     Final   Value: NO GROWTH     Performed at [facility]   Report Status 10/18/2013 FINAL   Final  MRSA PCR SCREENING     Status: None   Collection Time    10/16/13  6:15 PM      Result Value Ref Range Status    MRSA by PCR NEGATIVE  NEGATIVE Final   Comment:            The GeneXpert MRSA Assay (FDA     approved for NASAL specimens     only), is one component of a     comprehensive MRSA colonization     surveillance program. It is not     intended to diagnose MRSA     infection nor to guide or     monitor treatment for     MRSA  infections.  CULTURE, EXPECTORATED SPUTUM-ASSESSMENT     Status: None   Collection Time    10/17/13 12:37 PM      Result Value Ref Range Status   Specimen Description SPUTUM   Final   Special Requests NONE   Final   Sputum evaluation     Final   Value: THIS SPECIMEN IS ACCEPTABLE. RESPIRATORY CULTURE REPORT TO FOLLOW.   Report Status 10/17/2013 FINAL   Final     Studies: Dg Chest Port 1 View  10/16/2013   CLINICAL DATA:  Sepsis, hypoxia.  EXAM: PORTABLE CHEST - 1 VIEW  COMPARISON:  Single view of the chest 12/02/2009.  FINDINGS: There is unchanged mild elevation of the right hemidiaphragm relative to the left. Lungs are clear. Heart size is upper normal. No pneumothorax or pleural effusion.  IMPRESSION: No acute disease.   Electronically Signed   By: Drusilla Kanner M.D.   On: 10/16/2013 15:04    Scheduled Meds: . acidophilus  1 capsule Oral BID  . antiseptic oral rinse  7 mL Mouth Rinse q12n4p  . aspirin EC  81 mg Oral Daily  . aztreonam  2 g Intravenous Q8H  . budesonide (PULMICORT) nebulizer solution  0.25 mg Nebulization BID  . chlorhexidine  15 mL Mouth Rinse BID  . clobetasol cream   Topical BID  . FLUoxetine  20 mg Oral Daily  . furosemide  20 mg Intravenous Daily  . gabapentin  600 mg Oral TID  . glipiZIDE  5 mg Oral QAC breakfast  . guaiFENesin  600 mg Oral BID  . heparin  5,000 Units Subcutaneous 3 times per day  . insulin aspart  0-9 Units Subcutaneous TID WC  . insulin glargine  20 Units Subcutaneous QHS  . ipratropium-albuterol  3 mL Nebulization QID  . lamoTRIgine  75 mg Oral BID  . methadone  5 mg Oral BID  . methylPREDNISolone  (SOLU-MEDROL) injection  60 mg Intravenous Q12H  . pantoprazole  40 mg Oral Daily  . pneumococcal 23 valent vaccine  0.5 mL Intramuscular Tomorrow-1000  . sodium chloride  3 mL Intravenous Q12H  . vancomycin  1,500 mg Intravenous Q12H   Continuous Infusions:   Principal Problem:   Aspiration pneumonia (HCAP) Active Problems:   Psoriasis (a type of skin inflammation)   Diabetes mellitus   Obesity (BMI 30-39.9)   Myasthenia gravis   Antiphospholipid syndrome   Sepsis   Acute respiratory failure    Time spent: >30 minutes    Vassie Loll  [facility] Pager [phone]. If 7PM-7AM, please contact night-coverage at [email or web address], password [facility] 10/18/2013, 9:25 AM  LOS: 2 days

## 2013-10-18 NOTE — Progress Notes (Signed)
NIF -30. FVC 2.4  Patient gave good effort despite being sleepy.

## 2013-10-18 NOTE — Progress Notes (Signed)
Pt. Was found to have foley catheter leaking.  RN offered to remove foley pt. Refused for foley to be removed at this time. RN then offered to readjust foley and check to see if balloon was properly inflated and pt. Refused to have RN check catheter placement.  Pt. Was educated.  Will continue to monitor.

## 2013-10-18 NOTE — Progress Notes (Signed)
ANTIBIOTIC CONSULT NOTE - FOLLOW UP  Pharmacy Consult for aztreonam, vancomycin Indication: sepsis  Allergies  Allergen Reactions  . Penicillins Other (See Comments)    Reaction=unknown Has tolerated amoxicillin    Patient Measurements: Height: 5\' 8"  (172.7 cm) Weight: 313 lb (141.976 kg) IBW/kg (Calculated) : 63.9  Vital Signs: Temp: 98 F (36.7 C) (08/17 0800) Temp src: Oral (08/17 0800) BP: 127/48 mmHg (08/17 0800) Pulse Rate: 73 (08/17 0800) Intake/Output from previous day: 08/16 0701 - 08/17 0700 In: 2380 [P.O.:480; I.V.:700; IV Piggyback:1200] Out: 2100 [Urine:2100] Intake/Output from this shift: Total I/O In: -  Out: 150 [Urine:150]  Labs:  Recent Labs  10/16/13 1430 10/17/13 0404 10/18/13 0355  WBC 18.8* 16.5* 11.4*  HGB 12.4 11.1* 10.7*  PLT PLATELET CLUMPS NOTED ON SMEAR, COUNT APPEARS ADEQUATE 258 206  CREATININE 0.96 0.89 0.69   Estimated Creatinine Clearance: 102.4 ml/min (by C-G formula based on Cr of 0.69). No results found for this basename: Rolm GalaVANCOTROUGH, VANCOPEAK, VANCORANDOM, GENTTROUGH, GENTPEAK, GENTRANDOM, TOBRATROUGH, TOBRAPEAK, TOBRARND, AMIKACINPEAK, AMIKACINTROU, AMIKACIN,  in the last 72 hours   Assessment: [age] yoF admitted 8/15 from NH with fever, hypoxia, and AMS x 2 days PTA. Pt reported to be currently treated for URI with amoxicillin despite reported allergy to penicillins. Pharmacy has been consulted to dose aztreonam, levofloxacin, and vancomycin for presumed sepsis with HCAP source. No acute disease is seen on Xray.  Noted that aztreonam 2g IV x1, levofloxacin 750mg  IV x1, and vancomycin 2g IV x1 all ordered in the ED.  8/15 >> vancomycin >> 8/15 >> aztreonam >>  8/15 >> levofloxacin x 1  Tmax: 100.1 WBCs: improving Renal: SCr 0.69, CrCl >100 ml/min (CG), 76 ml/min (normalized) Lactic acid: wnl  8/15 MRSA screen neg 8/15 blood x2: ngtd 8/15 urine: NGF 8/15 R side abdominal fluid: ordered  Today is day #3 aztreonam 2g IV  q8h and Vanc 2g then 1500mg  q12h (per obesity nomogram) for sepsis with presumed pulmonary source (HCAP vs aspiration PNA vs AECOPD).   Goal of Therapy:  Vancomycin trough level 15-20 mcg/ml Eradication of infection Doses adjusted per renal clearance  Plan:  1.  Obtain vancomycin trough this afternoon.  Will adjust dose per result. 2.  F/u culture results, SCr, narrowing of antibiotics.   Clance Bollunyon, Lorren Rossetti 10/18/2013,10:41 AM

## 2013-10-18 NOTE — Progress Notes (Signed)
INITIAL NUTRITION ASSESSMENT  DOCUMENTATION CODES Per approved criteria  -Morbid Obesity   INTERVENTION: - Glucerna shakes BID - Hopefully as pt becomes more alert, intake will improve - RD to continue to monitor   NUTRITION DIAGNOSIS: Inadequate oral intake related to somnolence as evidenced by conversation with pt.   Goal: Pt to consume >90% of meals/supplements  Monitor:  Weights, labs, intake  Reason for Assessment: Malnutrition screening tool, low braden   68 y.o. female  Admitting Dx: Aspiration pneumonia  ASSESSMENT: Pt with history of myasthenia gravis, Sjogren's disease, diffuse psoriasis for which he does not take any medications, type 2 diabetes mellitus insulin-dependent, bipolar disorder, seizures, chronic bedbound state lives in nursing home, morbid obesity, depression anxiety, chronic pain on Dilaudid and methadone doses recently increased. Who reports to have had a productive cough for the last few days, running low grade fevers, lately patient started to experience higher fevers, more shortness of breath and became lethargic, she was found to be profoundly hypoxic at the nursing home and then sent to the ER.  - Pt on venturii mask and falls asleep when trying to stay alert to answer questions - Per conversation with staff at nursing home, pt was on a regular diet and was eating at least 50% of meals - States she did a lot of snacking "all day long" especially of things like candy, pringles, and vanilla Coke - Was not on any nutritional supplements PTA - Weight has been stable    Height: Ht Readings from Last 1 Encounters:  10/16/13 5\' 8"  (1.727 m)    Weight: Wt Readings from Last 1 Encounters:  10/18/13 313 lb (141.976 kg)    Ideal Body Weight: 140 lbs   % Ideal Body Weight: 223%  Wt Readings from Last 10 Encounters:  10/18/13 313 lb (141.976 kg)  01/20/11 364 lb 10.3 oz (165.4 kg)    Usual Body Weight: 313 lbs per staff  % Usual Body Weight:  100%  BMI:  Body mass index is 47.6 kg/(m^2). Class III extreme obesity   Estimated Nutritional Needs: Kcal: 1700-1900 Protein: 75-90g Fluid: per MD  Skin: +2 generalized edema  Diet Order:  soft   EDUCATION NEEDS: -No education needs identified at this time   Intake/Output Summary (Last 24 hours) at 10/18/13 1101 Last data filed at 10/18/13 1000  Gross per 24 hour  Intake   1670 ml  Output   2165 ml  Net   -495 ml    Last BM: PTA  Labs:   Recent Labs Lab 10/16/13 1430 10/17/13 0404 10/18/13 0355  NA 135* 137 138  K 4.5 3.8 4.1  CL 95* 99 101  CO2 28 29 30   BUN 13 12 10   CREATININE 0.96 0.89 0.69  CALCIUM 10.0 9.1 9.0  GLUCOSE 143* 44* 191*    CBG (last 3)   Recent Labs  10/17/13 1222 10/17/13 1558 10/17/13 2155  GLUCAP 126* 98 208*    Scheduled Meds: . acidophilus  1 capsule Oral BID  . antiseptic oral rinse  7 mL Mouth Rinse q12n4p  . aspirin EC  81 mg Oral Daily  . aztreonam  2 g Intravenous Q8H  . budesonide (PULMICORT) nebulizer solution  0.25 mg Nebulization BID  . chlorhexidine  15 mL Mouth Rinse BID  . clobetasol cream   Topical BID  . FLUoxetine  20 mg Oral Daily  . furosemide  20 mg Intravenous Daily  . gabapentin  600 mg Oral TID  . glipiZIDE  5 mg Oral QAC breakfast  . guaiFENesin  600 mg Oral BID  . heparin  5,000 Units Subcutaneous 3 times per day  . insulin aspart  0-9 Units Subcutaneous TID WC  . insulin glargine  20 Units Subcutaneous QHS  . ipratropium-albuterol  3 mL Nebulization QID  . lamoTRIgine  75 mg Oral BID  . methadone  5 mg Oral BID  . methylPREDNISolone (SOLU-MEDROL) injection  60 mg Intravenous Q12H  . pantoprazole  40 mg Oral Daily  . sodium chloride  3 mL Intravenous Q12H  . vancomycin  1,500 mg Intravenous Q12H    Continuous Infusions:   Past Medical History  Diagnosis Date  . MRSA cellulitis   . Psoriasis   . Obesities, morbid   . Sjogren's disease   . UTI (urinary tract infection)   .  Antiphospholipid syndrome   . Diverticulosis   . Anxiety   . Depression   . Asthma   . Ventral hernia   . Diabetes mellitus   . Myasthenia gravis   . Myasthenia gravis   . Myasthenia gravis   . Back pain, chronic     missing some vertebra per pt  . Complication of anesthesia     can't have muscle relaxers -my. gravis  . Bipolar disorder, unspecified   . Seizures     Past Surgical History  Procedure Laterality Date  . Abdominal hysterectomy    . Pilonidal cyst excision    . Cholecystectomy    . Appendectomy      Charlott RakesHeather Taralee Marcus MS, RD, LDN [phone] Pager [phone] Weekend/After Hours Pager

## 2013-10-19 LAB — BASIC METABOLIC PANEL
Anion gap: 8 (ref 5–15)
BUN: 11 mg/dL (ref 6–23)
CALCIUM: 9.9 mg/dL (ref 8.4–10.5)
CO2: 33 meq/L — AB (ref 19–32)
Chloride: 99 mEq/L (ref 96–112)
Creatinine, Ser: 0.69 mg/dL (ref 0.50–1.10)
GFR calc Af Amer: >90 mL/min (ref >90)
GFR calc non Af Amer: 88 mL/min — ABNORMAL LOW (ref >90)
GLUCOSE: 194 mg/dL — AB (ref 70–99)
POTASSIUM: 3.9 meq/L (ref 3.7–5.3)
Sodium: 140 mEq/L (ref 137–147)

## 2013-10-19 LAB — CBC
HCT: 34.3 % — ABNORMAL LOW (ref 36.0–46.0)
HEMOGLOBIN: 10.2 g/dL — AB (ref 12.0–15.0)
MCH: 24.9 pg — AB (ref 26.0–34.0)
MCHC: 29.7 g/dL — AB (ref 30.0–36.0)
MCV: 83.9 fL (ref 78.0–100.0)
Platelets: 240 10*3/uL (ref 150–400)
RBC: 4.09 MIL/uL (ref 3.87–5.11)
RDW: 17 % — ABNORMAL HIGH (ref 11.5–15.5)
WBC: 10.4 10*3/uL (ref 4.0–10.5)

## 2013-10-19 LAB — GLUCOSE, CAPILLARY
GLUCOSE-CAPILLARY: 200 mg/dL — AB (ref 70–99)
GLUCOSE-CAPILLARY: 188 mg/dL — AB (ref 70–99)
Glucose-Capillary: 238 mg/dL — ABNORMAL HIGH (ref 70–99)
Glucose-Capillary: 110 mg/dL — ABNORMAL HIGH (ref 70–99)

## 2013-10-19 MED ORDER — LEVOFLOXACIN 750 MG PO TABS
750.0000 mg | ORAL_TABLET | Freq: Every day | ORAL | Status: DC
  Administered 2013-10-20 (×2): 750 mg via ORAL
  Filled 2013-10-19 (×3): qty 1

## 2013-10-19 MED ORDER — INSULIN GLARGINE 100 UNIT/ML ~~LOC~~ SOLN
30.0000 [IU] | Freq: Every day | SUBCUTANEOUS | Status: DC
  Administered 2013-10-19 – 2013-10-20 (×2): 30 [IU] via SUBCUTANEOUS
  Filled 2013-10-19 (×4): qty 0.3

## 2013-10-19 MED ORDER — METHYLPREDNISOLONE SODIUM SUCC 40 MG IJ SOLR
40.0000 mg | Freq: Two times a day (BID) | INTRAMUSCULAR | Status: DC
  Administered 2013-10-19 – 2013-10-20 (×2): 40 mg via INTRAVENOUS
  Filled 2013-10-19 (×3): qty 1

## 2013-10-19 MED ORDER — INSULIN ASPART 100 UNIT/ML ~~LOC~~ SOLN
0.0000 [IU] | Freq: Three times a day (TID) | SUBCUTANEOUS | Status: DC
  Administered 2013-10-19: 3 [IU] via SUBCUTANEOUS
  Administered 2013-10-20: 2 [IU] via SUBCUTANEOUS
  Administered 2013-10-20 (×2): 3 [IU] via SUBCUTANEOUS
  Administered 2013-10-21: 2 [IU] via SUBCUTANEOUS

## 2013-10-19 NOTE — Progress Notes (Signed)
Clinical Social Work Department BRIEF PSYCHOSOCIAL ASSESSMENT 10/19/2013  Patient:  [name], [name]     Account Number:  [id number]     Admit date:  10/16/2013  Clinical Social Worker:  Ulyess Blossom  Date/Time:  10/19/2013 02:00 PM  Referred by:  Physician  Date Referred:  10/19/2013 Referred for  SNF Placement   Other Referral:   Interview type:  Patient Other interview type:    PSYCHOSOCIAL DATA Living Status:  FACILITY Admitted from facility:   Level of care:   Primary support name:  Misty McAfee/daughter/[phone] Primary support relationship to patient:  CHILD, ADULT Degree of support available:   lacking    CURRENT CONCERNS Current Concerns  Post-Acute Placement   Other Concerns:    SOCIAL WORK ASSESSMENT / PLAN CSW received referral that pt admitted from [facility].    CSW met with pt at bedside. CSW introduced self and explained role. CSW confirmed with pt that she is a resident at [facility]. Pt shared that she has been a resident at the facility for five years. CSW provided support to pt as she discussed that she wished that she did not have to reside in a facility, but knows that she needs the care at the facility. Pt agreeable to return to [facility] upon discharge. Pt expressed concern that her cell phone and glasses were at the facility and pt wishes to contact her daughter in [place]. CSW assisted pt in contacting pt daughter via telephone, but unable to reach pt daughter and no voice mailbox to leave voice message. CSW contacted facility to notify the facility that pt was concerned about her cell phone and glasses. Per facility, pt has been in communication with the administrator at the facility.    CSW completed FL2 and sent clinicals to [facility].    CSW will continue attempts to reach pt daughter to assist pt in speaking with pt daughter.    CSW to continue to follow and assist  with pt discharge needs back to [facility] when medically stable.   Assessment/plan status:  Psychosocial Support/Ongoing Assessment of Needs Other assessment/ plan:   discharge planning   Information/referral to community resources:   Referral back to [employer]    PATIENT'S/FAMILY'S RESPONSE TO PLAN OF CARE: Pt alert and oriented x 4. Pt wishes that she could live independently, but knows that she needs the care from [facility] and is familiar with the facility as pt has been a resident there for five years. Pt hoping to be able to reach her daughter in [place].   Alison Murray, MSW, [place] Work [phone]

## 2013-10-19 NOTE — Progress Notes (Signed)
NIF -50 FVC 2.5

## 2013-10-19 NOTE — Progress Notes (Signed)
[facility] PROGRESS NOTE  [name] ZOX:[id number] DOB: 15-Dec-1945 DOA: 10/16/2013 PCP: Pearson GrippeKIM, JAMES, MD  Assessment/Plan: 1-acute resp failure with hypoxia: secondary to COPD exacerbation/HCAP vs aspiration PNA. -continue oxygen supplementation (wean as tolerated) -continue solumedrol and pulmicort; patient responding well to treatment. Breathing easier and less wheezing. -continue PRN nebulizer treatments -continue antibiotics (vanc and aztreonam X 1 more day); will switch to PO levaquin starting 8/19 -continue mucinex, flutter valve and supportive care -imagine component of OHS also contributing to SOB  2-COPD: former heavy smoker; no formal PFT's available on records -will continue albuterol-atrovent nebulizer and pulmicort  -continue antibiotics (but will start process of switching to PO, after today's dose) -continue flutter valve -follow clinical response; hopefully back to SNF in 48 hours  3-encephalopathy: secondary to accidental narcotic overdose. -resolved with narcan -Patient has remained alert, awake and oriented to time, place and person.  4-History of myasthenia gravis. No new focal deficits, she is chronically bedbound but strength is 5/5 in all 4 extremities on my exam, outpatient neurology followup.  5-type 2 diabetes: well controlled. With neuropathy -a1C 5.7 -CBG's will be elevated due to steroids -continue glipizide, SSI and lantus (dose adjusted secondary to elevated sugars with steroids)  6-psoriasis: will continue clobetasol; Solu-Medrol initiated for patient's COPD will also help psoriatic lesions.  7-diabetic neuropathy: continue nuerontin   8-chronic pain: continue methadone (reduced dose) and vicodin for breakthrough  9-GERD: continue PPI  10-sepsis due to PNA: treatment as mentioned on #1. -sepsis parameters resolved -no fever, WBC's WNL now   DVT: heparin   Code Status: DNR Family Communication: no family at bedside Disposition  Plan: Will transfer to a MedSurg bed   Consultants:  None   Procedures:  See below for x-ray reports  Antibiotics:  Vancomycin 8/15--8/18  Aztreonam  8/15--8/18  levaquin 8/19  HPI/Subjective: Afebrile, breathing better, no CP.  Objective: Filed Vitals:   10/19/13 0505  BP: 136/53  Pulse: 74  Temp: 97.8 F (36.6 C)  Resp: 16    Intake/Output Summary (Last 24 hours) at 10/19/13 1249 Last data filed at 10/19/13 1241  Gross per 24 hour  Intake   1370 ml  Output   2500 ml  Net  -1130 ml   Filed Weights   10/16/13 1800 10/17/13 0434 10/18/13 0402  Weight: 143.1 kg (315 lb 7.7 oz) 145.1 kg (319 lb 14.2 oz) 141.976 kg (313 lb)    Exam:   General:  Afebrile, breathing better and with better O2 saturation; denies CP.  Cardiovascular:S1 and S2, no rubs or gallops  Respiratory: Improved air movement, scattered rhonchi, slight exp wheezing; faint bibasilar crackles  Abdomen: soft, NT, ND, positive BS, positive ventral hernia. Abdominal skin with active serosanguineous discharge on right flank from scalded psoriatic lesions  Musculoskeletal: 1+edema bilaterally, no cyanosis  Skin: multiple areas with psoriatic lesions  Data Reviewed: Basic Metabolic Panel:  Recent Labs Lab 10/16/13 1430 10/17/13 0404 10/18/13 0355 10/19/13 0407  NA 135* 137 138 140  K 4.5 3.8 4.1 3.9  CL 95* 99 101 99  CO2 28 29 30  33*  GLUCOSE 143* 44* 191* 194*  BUN 13 12 10 11   CREATININE 0.96 0.89 0.69 0.69  CALCIUM 10.0 9.1 9.0 9.9   Liver Function Tests:  Recent Labs Lab 10/16/13 1430  AST 11  ALT 8  ALKPHOS 147*  BILITOT 0.4  PROT 6.9  ALBUMIN 2.7*   CBC:  Recent Labs Lab 10/16/13 1430 10/17/13 0404 10/18/13 0355 10/19/13 0407  WBC 18.8*  16.5* 11.4* 10.4  NEUTROABS 16.8*  --   --   --   HGB 12.4 11.1* 10.7* 10.2*  HCT 40.7 36.0 35.7* 34.3*  MCV 83.4 83.1 83.6 83.9  PLT PLATELET CLUMPS NOTED ON SMEAR, COUNT APPEARS ADEQUATE 258 206 240   CBG:  Recent  Labs Lab 10/18/13 0745 10/18/13 1617 10/18/13 2009 10/19/13 0737 10/19/13 1151  GLUCAP 162* 222* 184* 238* 110*    Recent Results (from the past 240 hour(s))  CULTURE, BLOOD (ROUTINE X 2)     Status: None   Collection Time    10/16/13  2:30 PM      Result Value Ref Range Status   Specimen Description BLOOD LEFT ARM   Final   Special Requests BOTTLES DRAWN AEROBIC AND ANAEROBIC 3.5CC EACH   Final   Culture  Setup Time     Final   Value: 10/16/2013 18:00     Performed at [facility]   Culture     Final   Value:        BLOOD CULTURE RECEIVED NO GROWTH TO DATE CULTURE WILL BE HELD FOR 5 DAYS BEFORE ISSUING A FINAL NEGATIVE REPORT     Performed at [facility]   Report Status PENDING   Incomplete  CULTURE, BLOOD (ROUTINE X 2)     Status: None   Collection Time    10/16/13  2:30 PM      Result Value Ref Range Status   Specimen Description BLOOD RIGHT ANTECUBITAL   Final   Special Requests BOTTLES DRAWN AEROBIC ONLY 2CC   Final   Culture  Setup Time     Final   Value: 10/16/2013 18:01     Performed at [facility]   Culture     Final   Value:        BLOOD CULTURE RECEIVED NO GROWTH TO DATE CULTURE WILL BE HELD FOR 5 DAYS BEFORE ISSUING A FINAL NEGATIVE REPORT     Performed at [facility]   Report Status PENDING   Incomplete  URINE CULTURE     Status: None   Collection Time    10/16/13  4:45 PM      Result Value Ref Range Status   Specimen Description URINE, CATHETERIZED   Final   Special Requests NONE   Final   Culture  Setup Time     Final   Value: 10/16/2013 22:03     Performed at [facility] Count     Final   Value: NO GROWTH     Performed at [facility]   Culture     Final   Value: NO GROWTH     Performed at [facility]   Report Status 10/18/2013 FINAL   Final  MRSA PCR SCREENING     Status: None   Collection Time    10/16/13  6:15 PM      Result Value Ref Range Status   MRSA by PCR NEGATIVE   NEGATIVE Final   Comment:            The GeneXpert MRSA Assay (FDA     approved for NASAL specimens     only), is one component of a     comprehensive MRSA colonization     surveillance program. It is not     intended to diagnose MRSA     infection nor to guide or     monitor treatment for  MRSA infections.  CULTURE, EXPECTORATED SPUTUM-ASSESSMENT     Status: None   Collection Time    10/17/13 12:37 PM      Result Value Ref Range Status   Specimen Description SPUTUM   Final   Special Requests NONE   Final   Sputum evaluation     Final   Value: THIS SPECIMEN IS ACCEPTABLE. RESPIRATORY CULTURE REPORT TO FOLLOW.   Report Status 10/17/2013 FINAL   Final  CULTURE, RESPIRATORY (NON-EXPECTORATED)     Status: None   Collection Time    10/17/13 12:37 PM      Result Value Ref Range Status   Specimen Description SPUTUM   Final   Special Requests NONE   Final   Gram Stain     Final   Value: NO WBC SEEN     NO SQUAMOUS EPITHELIAL CELLS SEEN     RARE GRAM POSITIVE COCCI     IN PAIRS RARE YEAST     Performed at [facility]   Culture     Final   Value: ABUNDANT CANDIDA ALBICANS     Performed at [facility]   Report Status PENDING   Incomplete     Studies: No results found.  Scheduled Meds: . acidophilus  1 capsule Oral BID  . antiseptic oral rinse  7 mL Mouth Rinse q12n4p  . aspirin EC  81 mg Oral Daily  . aztreonam  2 g Intravenous Q8H  . budesonide (PULMICORT) nebulizer solution  0.25 mg Nebulization BID  . chlorhexidine  15 mL Mouth Rinse BID  . clobetasol cream   Topical BID  . feeding supplement (GLUCERNA SHAKE)  237 mL Oral BID BM  . FLUoxetine  20 mg Oral Daily  . furosemide  20 mg Intravenous Daily  . gabapentin  600 mg Oral TID  . glipiZIDE  5 mg Oral QAC breakfast  . guaiFENesin  600 mg Oral BID  . heparin  5,000 Units Subcutaneous 3 times per day  . insulin aspart  0-15 Units Subcutaneous TID WC  . insulin glargine  30 Units Subcutaneous QHS   . ipratropium-albuterol  3 mL Nebulization TID  . lamoTRIgine  75 mg Oral BID  . methadone  5 mg Oral BID  . methylPREDNISolone (SOLU-MEDROL) injection  40 mg Intravenous Q12H  . pantoprazole  40 mg Oral Daily  . sodium chloride  3 mL Intravenous Q12H  . vancomycin  1,250 mg Intravenous Q12H   Continuous Infusions:   Principal Problem:   Aspiration pneumonia (HCAP) Active Problems:   Psoriasis (a type of skin inflammation)   Diabetes mellitus   Obesity (BMI 30-39.9)   Myasthenia gravis   Antiphospholipid syndrome   Sepsis   Acute respiratory failure    Time spent: >30 minutes    Vassie Loll  [facility] Pager [phone]. If 7PM-7AM, please contact night-coverage at [email or web address], password [facility] 10/19/2013, 12:49 PM  LOS: 3 days

## 2013-10-19 NOTE — Progress Notes (Addendum)
Pt gave good effort with nif/fvc parameters.  Best of two attempts:  NIF: -30 FVC: 1.9

## 2013-10-19 NOTE — Progress Notes (Signed)
Inpatient Diabetes Program Recommendations  AACE/ADA: New Consensus Statement on Inpatient Glycemic Control (2013)  Target Ranges:  Prepandial:   less than 140 mg/dL      Peak postprandial:   less than 180 mg/dL (1-2 hours)      Critically ill patients:  140 - 180 mg/dL   Reason for Assessment: Hyperglycemia  Diabetes history: Type 2 Outpatient Diabetes medications: Lantus 45 units at HS, Glucotrol 10 mg daily, Novolog moderate correction tid Current orders for Inpatient glycemic control: Lantus 20 units at HS, Glucotrol 5 mg daily, Novolog sensitive correction tid  Results for [name] FredericksonBARNES, Lynae H (MRN [id number]) as of 10/19/2013 09:54  Ref. Range 10/18/2013 07:45 10/18/2013 16:17 10/18/2013 20:09 10/19/2013 07:37  Glucose-Capillary Latest Range: 70-99 mg/dL 829162 (H) 562222 (H) 130184 (H) 238 (H)   Note: CBG well over target this morning indicating need for increase in basal insulin.  Continues to receive Solu Medrol.  Request MD consider the following:  Increase Lantus to 30 units at HS  (2/3 nursing home dose)  Increase correction to moderate Novolog scale Thank you.  Daryl Quiros S. Elsie Lincolnouth, RN, CNS, CDE Inpatient Diabetes Program, team pager [phone]

## 2013-10-20 LAB — GLUCOSE, CAPILLARY
GLUCOSE-CAPILLARY: 199 mg/dL — AB (ref 70–99)
GLUCOSE-CAPILLARY: 149 mg/dL — AB (ref 70–99)
GLUCOSE-CAPILLARY: 253 mg/dL — AB (ref 70–99)
Glucose-Capillary: 171 mg/dL — ABNORMAL HIGH (ref 70–99)

## 2013-10-20 MED ORDER — IPRATROPIUM-ALBUTEROL 0.5-2.5 (3) MG/3ML IN SOLN: 3.0000 mL | Freq: Two times a day (BID) | RESPIRATORY_TRACT | Status: DC

## 2013-10-20 MED ORDER — IPRATROPIUM-ALBUTEROL 0.5-2.5 (3) MG/3ML IN SOLN: 3.0000 mL | Freq: Three times a day (TID) | RESPIRATORY_TRACT | Status: DC

## 2013-10-20 MED ORDER — IPRATROPIUM-ALBUTEROL 0.5-2.5 (3) MG/3ML IN SOLN
3.0000 mL | Freq: Four times a day (QID) | RESPIRATORY_TRACT | Status: DC
  Administered 2013-10-20 – 2013-10-21 (×4): 3 mL via RESPIRATORY_TRACT
  Filled 2013-10-20 (×4): qty 3

## 2013-10-20 MED ORDER — ALBUTEROL SULFATE (2.5 MG/3ML) 0.083% IN NEBU: 2.5000 mg | INHALATION_SOLUTION | RESPIRATORY_TRACT | Status: DC | PRN

## 2013-10-20 MED ORDER — PREDNISONE 50 MG PO TABS
50.0000 mg | ORAL_TABLET | Freq: Every day | ORAL | Status: DC
  Administered 2013-10-21: 50 mg via ORAL
  Filled 2013-10-20 (×3): qty 1

## 2013-10-20 NOTE — Progress Notes (Signed)
CSW continuing to follow for disposition planning.  [facility] and plans to return upon discharge.  Per MD, pt not yet medically ready for discharge.  CSW attempted to meet with pt at bedside to follow up, but pt sleeping soundly at this time and did not awake to this CSW knock on the door or calling of pt name.  CSW had voice message this morning from pt ex-husband stating that he planned to visit pt this morning at hospital.  [facility] and left message updating facility on pt status.  CSW to continue to follow and assist with pt disposition planning once pt medically ready for discharge.  Loletta SpecterSuzanna Marzella Miracle, MSW, LCSW Clinical Social Work [phone]

## 2013-10-20 NOTE — Progress Notes (Signed)
Best of two efforts:  NIF: -40 FVC: 1.8

## 2013-10-20 NOTE — Progress Notes (Signed)
ANTIBIOTIC CONSULT NOTE - INITIAL  Pharmacy Consult for Oral levofloxacin Indication: HCAP  Allergies  Allergen Reactions  . Penicillins Other (See Comments)    Reaction=unknown Has tolerated amoxicillin    Patient Measurements: Height: 5\' 8"  (172.7 cm) Weight: 313 lb (141.976 kg) IBW/kg (Calculated) : 63.9 Adjusted Body Weight:   Vital Signs: Temp: 98.2 F (36.8 C) (08/18 2215) Temp src: Oral (08/18 2215) BP: 101/59 mmHg (08/18 2215) Pulse Rate: 83 (08/18 2215) Intake/Output from previous day: 08/18 0701 - 08/19 0700 In: 480 [P.O.:480] Out: 2300 [Urine:2300] Intake/Output from this shift: Total I/O In: 240 [P.O.:240] Out: 300 [Urine:300]  Labs:  Recent Labs  10/18/13 0355 10/19/13 0407  WBC 11.4* 10.4  HGB 10.7* 10.2*  PLT 206 240  CREATININE 0.69 0.69   Estimated Creatinine Clearance: 102.4 ml/min (by C-G formula based on Cr of 0.69).  Recent Labs  10/18/13 1524  VANCOTROUGH 20.6*     Microbiology: Recent Results (from the past 720 hour(s))  CULTURE, BLOOD (ROUTINE X 2)     Status: None   Collection Time    10/16/13  2:30 PM      Result Value Ref Range Status   Specimen Description BLOOD LEFT ARM   Final   Special Requests BOTTLES DRAWN AEROBIC AND ANAEROBIC 3.5CC EACH   Final   Culture  Setup Time     Final   Value: 10/16/2013 18:00     Performed at [facility]   Culture     Final   Value:        BLOOD CULTURE RECEIVED NO GROWTH TO DATE CULTURE WILL BE HELD FOR 5 DAYS BEFORE ISSUING A FINAL NEGATIVE REPORT     Performed at [facility]   Report Status PENDING   Incomplete  CULTURE, BLOOD (ROUTINE X 2)     Status: None   Collection Time    10/16/13  2:30 PM      Result Value Ref Range Status   Specimen Description BLOOD RIGHT ANTECUBITAL   Final   Special Requests BOTTLES DRAWN AEROBIC ONLY 2CC   Final   Culture  Setup Time     Final   Value: 10/16/2013 18:01     Performed at [facility]   Culture     Final   Value:        BLOOD CULTURE RECEIVED NO GROWTH TO DATE CULTURE WILL BE HELD FOR 5 DAYS BEFORE ISSUING A FINAL NEGATIVE REPORT     Performed at [facility]   Report Status PENDING   Incomplete  URINE CULTURE     Status: None   Collection Time    10/16/13  4:45 PM      Result Value Ref Range Status   Specimen Description URINE, CATHETERIZED   Final   Special Requests NONE   Final   Culture  Setup Time     Final   Value: 10/16/2013 22:03     Performed at [facility] Count     Final   Value: NO GROWTH     Performed at [facility]   Culture     Final   Value: NO GROWTH     Performed at [facility]   Report Status 10/18/2013 FINAL   Final  MRSA PCR SCREENING     Status: None   Collection Time    10/16/13  6:15 PM      Result Value Ref Range Status   MRSA  by PCR NEGATIVE  NEGATIVE Final   Comment:            The GeneXpert MRSA Assay (FDA     approved for NASAL specimens     only), is one component of a     comprehensive MRSA colonization     surveillance program. It is not     intended to diagnose MRSA     infection nor to guide or     monitor treatment for     MRSA infections.  CULTURE, EXPECTORATED SPUTUM-ASSESSMENT     Status: None   Collection Time    10/17/13 12:37 PM      Result Value Ref Range Status   Specimen Description SPUTUM   Final   Special Requests NONE   Final   Sputum evaluation     Final   Value: THIS SPECIMEN IS ACCEPTABLE. RESPIRATORY CULTURE REPORT TO FOLLOW.   Report Status 10/17/2013 FINAL   Final  CULTURE, RESPIRATORY (NON-EXPECTORATED)     Status: None   Collection Time    10/17/13 12:37 PM      Result Value Ref Range Status   Specimen Description SPUTUM   Final   Special Requests NONE   Final   Gram Stain     Final   Value: NO WBC SEEN     NO SQUAMOUS EPITHELIAL CELLS SEEN     RARE GRAM POSITIVE COCCI     IN PAIRS RARE YEAST     [facility] at [facility]   Culture     Final   Value:  ABUNDANT CANDIDA ALBICANS     [facility] at [facility]   Report Status PENDING   Incomplete    Medical History: Past Medical History  Diagnosis Date  . MRSA cellulitis   . Psoriasis   . Obesities, morbid   . Sjogren's disease   . UTI (urinary tract infection)   . Antiphospholipid syndrome   . Diverticulosis   . Anxiety   . Depression   . Asthma   . Ventral hernia   . Diabetes mellitus   . Myasthenia gravis   . Myasthenia gravis   . Myasthenia gravis   . Back pain, chronic     missing some vertebra per pt  . Complication of anesthesia     can't have muscle relaxers -my. gravis  . Bipolar disorder, unspecified   . Seizures     Medications:  Anti-infectives   Start     Dose/Rate Route Frequency Ordered Stop   10/20/13 0000  levofloxacin (LEVAQUIN) tablet 750 mg     750 mg Oral Daily at bedtime 10/19/13 2352     10/18/13 1800  vancomycin (VANCOCIN) 1,250 mg in sodium chloride 0.9 % 250 mL IVPB     1,250 mg 166.7 mL/hr over 90 Minutes Intravenous Every 12 hours 10/18/13 1701 10/19/13 1941   10/17/13 1000  levofloxacin (LEVAQUIN) IVPB 750 mg  Status:  Discontinued     750 mg 100 mL/hr over 90 Minutes Intravenous Every 24 hours 10/16/13 1540 10/16/13 1808   10/17/13 0400  vancomycin (VANCOCIN) 1,500 mg in sodium chloride 0.9 % 500 mL IVPB  Status:  Discontinued     1,500 mg 250 mL/hr over 120 Minutes Intravenous Every 12 hours 10/16/13 1540 10/18/13 1700   10/16/13 2200  aztreonam (AZACTAM) 2 g in dextrose 5 % 50 mL IVPB     2 g 100 mL/hr over 30 Minutes Intravenous Every 8 hours 10/16/13 1540 10/19/13 2221  10/16/13 1515  vancomycin (VANCOCIN) 2,000 mg in sodium chloride 0.9 % 500 mL IVPB     2,000 mg 250 mL/hr over 120 Minutes Intravenous  Once 10/16/13 1514 10/16/13 1750   10/16/13 1430  levofloxacin (LEVAQUIN) IVPB 750 mg     750 mg 100 mL/hr over 90 Minutes Intravenous  Once 10/16/13 1424 10/16/13 1709   10/16/13 1430  aztreonam (AZACTAM) 2 g in dextrose  5 % 50 mL IVPB     2 g 100 mL/hr over 30 Minutes Intravenous  Once 10/16/13 1424 10/16/13 1550   10/16/13 1430  vancomycin (VANCOCIN) IVPB 1000 mg/200 mL premix  Status:  Discontinued     1,000 mg 200 mL/hr over 60 Minutes Intravenous  Once 10/16/13 1424 10/16/13 1514     Assessment: Patient being followed by pharmacy for prior vancomycin, aztreonam.  MD wishes to change to levofloxacin (oral).    Goal of Therapy:  Levofloxacin dosed based on patient weight and renal function   Plan:  Follow up culture results Levofloxacin 750mg  PO q24hr  Darlina Guys, Zeric Baranowski [place] 10/20/2013,5:27 AM

## 2013-10-20 NOTE — Progress Notes (Signed)
Patient ID: [name] Fredericksonleanor H Damian, female   DOB: [birth year], 68 y.o.   MRN: [id number]  [facility] PROGRESS NOTE  [name] Fredericksonleanor H [name] XBJ:[id number] DOB: [birth year] DOA: 10/16/2013 PCP: Pearson GrippeKIM, JAMES, MD  Brief narrative: 68 y.o. Female with history of myasthenia gravis, Sjogren's disease, diffuse psoriasis for which she does not take any medications, type 2 diabetes mellitus insulin-dependent, bipolar disorder, seizures, chronic bedbound state lives in nursing home, morbid obesity, depression anxiety, chronic pain on Dilaudid and methadone and recently increased, presented to [facility] ED with productive cough of yellow sputum, associated with fevers, chills, malaise. In ED, pt noted to be hypoxic with oxygen saturation in 80's on RA and initial work up suggestive of HCAP with early aspiration.   Assessment and Plan:   Acute resp failure with hypoxia  Secondary to COPD exacerbation/HCAP vs aspiration PNA.   Clinically stable this AM and pt reports feeling better  Continue supportive care with oxygen as needed solumedrol tapering, BD's scheduled and as needed  Pt has been receiving Vancomycin and Aztreonam, will change to Levaquin PO today  Continue mucinex, flutter valve and supportive care  COPD: former heavy smoker  No formal PFT's available on records   Management with BD's scheduled and as needed  Continue flutter valve and pulmonary toiletry  Taper steroids and change ABX to PO  Encephalopathy  Secondary to accidental narcotic overdose imposed on HCAP   Resolved  Anemia of chronic disease  No signs of active bleeding  CBC in AM History of myasthenia gravis.   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  Medical Consultants:   None Other Consultants:   Physical therapy  Anti-Infectives:   Vancomycin 8/15--8/18  Aztreonam 8/15--8/18  levaquin 8/19  Code Status: Full Family Communication: Pt at bedside Disposition Plan: SNF in AM   HPI/Subjective: No events overnight.   Objective: Filed Vitals:   10/19/13 2001 10/19/13 2215 10/20/13 0500 10/20/13 0534  BP:  101/59  132/67  Pulse:  83  82  Temp:  98.2 F (36.8 C)  98.4 F (36.9 C)  TempSrc:  Oral  Oral  Resp:  16  16  Height:      Weight:   151.501 kg (334 lb)   SpO2: 94% 96%  94%    Intake/Output Summary (Last 24 hours) at 10/20/13 0749 Last data filed at 10/20/13 0700  Gross per 24 hour  Intake    720 ml  Output   3100 ml  Net  -2380 ml    Exam:   General:  Pt is alert, follows commands appropriately, not in acute distress  Cardiovascular: Regular rate and rhythm, no rubs, no gallops  Respiratory: Clear to auscultation bilaterally, diminished breath sound at bases   Abdomen: Soft, non tender, non distended, bowel sounds present, no guarding  Extremities: No edema, pulses DP and PT palpable bilaterally  Data Reviewed: Basic Metabolic Panel:  Recent Labs Lab 10/16/13 1430 10/17/13 0404 10/18/13 0355 10/19/13 0407  NA 135* 137 138 140  K 4.5 3.8 4.1 3.9  CL 95* 99 101 99  CO2 28 29 30  33*  GLUCOSE 143* 44* 191* 194*  BUN 13 12 10 11   CREATININE 0.96 0.89 0.69 0.69  CALCIUM 10.0 9.1 9.0 9.9   Liver Function Tests:  Recent Labs Lab 10/16/13 1430  AST 11  ALT 8  ALKPHOS 147*  BILITOT 0.4  PROT 6.9  ALBUMIN 2.7*   CBC:  Recent Labs Lab 10/16/13 1430 10/17/13 0404 10/18/13 0355 10/19/13 0407  WBC 18.8* 16.5* 11.4* 10.4  NEUTROABS 16.8*  --   --   --   HGB 12.4 11.1* 10.7* 10.2*  HCT 40.7 36.0 35.7* 34.3*  MCV 83.4 83.1 83.6 83.9  PLT PLATELET CLUMPS NOTED ON SMEAR,  COUNT APPEARS ADEQUATE 258 206 240   CBG:  Recent Labs Lab 10/19/13 0737 10/19/13 1151 10/19/13 1734 10/19/13 2209 10/20/13 0727  GLUCAP 238* 110* 200* 188* 199*    Recent Results (from the past 240 hour(s))  CULTURE, BLOOD (ROUTINE X 2)     Status: None   Collection Time    10/16/13  2:30 PM      Result Value Ref Range Status   Specimen Description BLOOD LEFT ARM   Final   Special Requests BOTTLES DRAWN AEROBIC AND ANAEROBIC 3.5CC EACH   Final   Culture  Setup Time     Final   Value: 10/16/2013 18:00     Performed at [facility]   Culture     Final   Value:        BLOOD CULTURE RECEIVED NO GROWTH TO DATE CULTURE WILL BE HELD FOR 5 DAYS BEFORE ISSUING A FINAL NEGATIVE REPORT     Performed at [facility]   Report Status PENDING   Incomplete  CULTURE, BLOOD (ROUTINE X 2)     Status: None   Collection Time    10/16/13  2:30 PM      Result Value Ref Range Status   Specimen Description BLOOD RIGHT ANTECUBITAL   Final   Special Requests BOTTLES DRAWN AEROBIC ONLY 2CC   Final   Culture  Setup Time     Final   Value: 10/16/2013 18:01     Performed at [facility]   Culture     Final   Value:        BLOOD CULTURE RECEIVED NO GROWTH TO DATE CULTURE WILL BE HELD FOR 5 DAYS BEFORE ISSUING A FINAL NEGATIVE REPORT     Performed at [facility]   Report Status PENDING   Incomplete  URINE CULTURE     Status: None   Collection Time    10/16/13  4:45 PM      Result Value Ref Range Status   Specimen Description URINE, CATHETERIZED   Final   Special Requests NONE   Final   Culture  Setup Time     Final   Value: 10/16/2013 22:03     Performed at [facility] Count     Final   Value: NO GROWTH     Performed at [facility]   Culture     Final   Value: NO GROWTH     Performed at [facility]   Report Status 10/18/2013 FINAL   Final  MRSA PCR SCREENING     Status: None   Collection Time    10/16/13  6:15 PM       Result Value Ref Range Status   MRSA by PCR NEGATIVE  NEGATIVE Final   Comment:  The GeneXpert MRSA Assay (FDA     approved for NASAL specimens     only), is one component of a     comprehensive MRSA colonization     surveillance program. It is not     intended to diagnose MRSA     infection nor to guide or     monitor treatment for     MRSA infections.  CULTURE, EXPECTORATED SPUTUM-ASSESSMENT     Status: None   Collection Time    10/17/13 12:37 PM      Result Value Ref Range Status   Specimen Description SPUTUM   Final   Special Requests NONE   Final   Sputum evaluation     Final   Value: THIS SPECIMEN IS ACCEPTABLE. RESPIRATORY CULTURE REPORT TO FOLLOW.   Report Status 10/17/2013 FINAL   Final  CULTURE, RESPIRATORY (NON-EXPECTORATED)     Status: None   Collection Time    10/17/13 12:37 PM      Result Value Ref Range Status   Specimen Description SPUTUM   Final   Special Requests NONE   Final   Gram Stain     Final   Value: NO WBC SEEN     NO SQUAMOUS EPITHELIAL CELLS SEEN     RARE GRAM POSITIVE COCCI     IN PAIRS RARE YEAST     Performed at [facility]   Culture     Final   Value: ABUNDANT CANDIDA ALBICANS     Performed at [facility]   Report Status PENDING   Incomplete     Scheduled Meds: . acidophilus  1 capsule Oral BID  . antiseptic oral rinse  7 mL Mouth Rinse q12n4p  . aspirin EC  81 mg Oral Daily  . budesonide (PULMICORT) nebulizer solution  0.25 mg Nebulization BID  . chlorhexidine  15 mL Mouth Rinse BID  . clobetasol cream   Topical BID  . feeding supplement (GLUCERNA SHAKE)  237 mL Oral BID BM  . FLUoxetine  20 mg Oral Daily  . furosemide  20 mg Intravenous Daily  . gabapentin  600 mg Oral TID  . glipiZIDE  5 mg Oral QAC breakfast  . guaiFENesin  600 mg Oral BID  . heparin  5,000 Units Subcutaneous 3 times per day  . insulin aspart  0-15 Units Subcutaneous TID WC  . insulin glargine  30 Units Subcutaneous QHS  .  ipratropium-albuterol  3 mL Nebulization TID  . lamoTRIgine  75 mg Oral BID  . levofloxacin  750 mg Oral QHS  . methadone  5 mg Oral BID  . methylPREDNISolone (SOLU-MEDROL) injection  40 mg Intravenous Q12H  . pantoprazole  40 mg Oral Daily  . sodium chloride  3 mL Intravenous Q12H   Continuous Infusions:    Debbora Presto, MD  [facility] Pager [phone]  If 7PM-7AM, please contact night-coverage [email or web address] Password [email or web address] 10/20/2013, 7:49 AM   LOS: 4 days

## 2013-10-20 NOTE — Progress Notes (Signed)
NIF -50 FVC 2.7 Good effort

## 2013-10-21 LAB — BASIC METABOLIC PANEL
Anion gap: 7 (ref 5–15)
BUN: 13 mg/dL (ref 6–23)
CO2: 32 mEq/L (ref 19–32)
Calcium: 10.3 mg/dL (ref 8.4–10.5)
Chloride: 101 mEq/L (ref 96–112)
Creatinine, Ser: 0.69 mg/dL (ref 0.50–1.10)
GFR calc non Af Amer: 88 mL/min — ABNORMAL LOW (ref >90)
Glucose, Bld: 112 mg/dL — ABNORMAL HIGH (ref 70–99)
POTASSIUM: 3.8 meq/L (ref 3.7–5.3)
Sodium: 140 mEq/L (ref 137–147)

## 2013-10-21 LAB — CBC
HEMATOCRIT: 34.4 % — AB (ref 36.0–46.0)
Hemoglobin: 10.8 g/dL — ABNORMAL LOW (ref 12.0–15.0)
MCH: 25.6 pg — ABNORMAL LOW (ref 26.0–34.0)
MCHC: 31.4 g/dL (ref 30.0–36.0)
MCV: 81.5 fL (ref 78.0–100.0)
PLATELETS: 280 10*3/uL (ref 150–400)
RBC: 4.22 MIL/uL (ref 3.87–5.11)
RDW: 17.4 % — AB (ref 11.5–15.5)
WBC: 10.1 10*3/uL (ref 4.0–10.5)

## 2013-10-21 LAB — CULTURE, RESPIRATORY: GRAM STAIN: NONE SEEN

## 2013-10-21 LAB — CULTURE, RESPIRATORY W GRAM STAIN

## 2013-10-21 LAB — GLUCOSE, CAPILLARY
Glucose-Capillary: 93 mg/dL (ref 70–99)
Glucose-Capillary: 127 mg/dL — ABNORMAL HIGH (ref 70–99)

## 2013-10-21 MED ORDER — HYDROMORPHONE HCL 2 MG PO TABS: 2.0000 mg | ORAL_TABLET | Freq: Four times a day (QID) | ORAL | Status: DC | PRN

## 2013-10-21 MED ORDER — PREDNISONE 10 MG PO TABS: ORAL_TABLET | ORAL | Status: DC

## 2013-10-21 MED ORDER — METHADONE HCL 10 MG PO TABS: 10.0000 mg | ORAL_TABLET | Freq: Two times a day (BID) | ORAL | Status: DC

## 2013-10-21 MED ORDER — BUDESONIDE 0.25 MG/2ML IN SUSP: 0.2500 mg | Freq: Two times a day (BID) | RESPIRATORY_TRACT | Status: DC

## 2013-10-21 MED ORDER — SULFAMETHOXAZOLE-TMP DS 800-160 MG PO TABS: 1.0000 | ORAL_TABLET | Freq: Two times a day (BID) | ORAL | Status: DC

## 2013-10-21 NOTE — Progress Notes (Signed)
Correction to FVC on 10/21/13  FVC= 1.750

## 2013-10-21 NOTE — Discharge Instructions (Signed)

## 2013-10-21 NOTE — Progress Notes (Signed)
Best of 2 efforts=  NIF= -40 FVC=2.0

## 2013-10-21 NOTE — Progress Notes (Signed)
Best of two efforts-  NIF= -50 FVC=750

## 2013-10-21 NOTE — Progress Notes (Signed)
[facility].  CSW facilitated pt discharge needs including contacting facility, faxing pt discharge information via [facility], discussing with pt at bedside, and scheduling ambulance [facility].   Pt requested CSW to contact pt ex-husband, Danny via telephone. CSW contacted pt ex-husband, Dannielle HuhDanny, but phone number went straight to voicemail and CSW left message.  Pt agreeable to [facility] and appreciative of CSW assistance.  No further social work needs identified at this time.  CSW signing off.   Loletta SpecterSuzanna Kidd, MSW, LCSW Clinical Social Work [phone]

## 2013-10-21 NOTE — Discharge Summary (Signed)
Physician Discharge Summary  [name] WUJ:[id number] DOB: Dec 13, 1945 DOA: 10/16/2013  PCP: Pearson Grippe, MD  Admit date: 10/16/2013 Discharge date: 10/21/2013  Recommendations for Outpatient Follow-up:  1. Pt will need to follow up with PCP in 2-3 weeks post discharge 2. Please obtain BMP to evaluate electrolytes and kidney function 3. Please also check CBC to evaluate Hg and Hct levels 4. Continue Bactrim for 5 more days post discharge   Discharge Diagnoses: Aspiration PNA, HCAP, MRSA in sputum  Principal Problem:   Aspiration pneumonia (HCAP) Active Problems:   Psoriasis (a type of skin inflammation)   Diabetes mellitus   Obesity (BMI 30-39.9)   Myasthenia gravis   Antiphospholipid syndrome   Sepsis   Acute respiratory failure  Discharge Condition: Stable  Diet recommendation: Heart healthy diet discussed in details   Brief narrative:  68 y.o. Female with history of myasthenia gravis, Sjogren's disease, diffuse psoriasis for which she does not take any medications, type 2 diabetes mellitus insulin-dependent, bipolar disorder, seizures, chronic bedbound state lives in nursing home, morbid obesity, depression anxiety, chronic pain on Dilaudid and methadone and recently increased, presented to [facility] ED with productive cough of yellow sputum, associated with fevers, chills, malaise. In ED, pt noted to be hypoxic with oxygen saturation in 80's on RA and initial work up suggestive of HCAP with early aspiration.   Assessment and Plan:   Acute resp failure with hypoxia  Secondary to COPD exacerbation/HCAP vs aspiration PNA.  Clinically stable this AM and pt reports feeling better  Continue supportive care with BD as needed, continue to taper down Prednisone Pt has been receiving Vancomycin and Aztreonam, was changed to Levaquin but sputum cultures positive for MRSA sensitive to Bactrim so will change to Bactrim COPD: former heavy smoker  No formal PFT's available on records   Management with BD's  as needed  Taper steroids and change ABX to PO Bactrim Acute Encephalopathy  Secondary to accidental narcotic overdose imposed on HCAP  Resolved  Anemia of chronic disease  No signs of active bleeding  History of myasthenia gravis.  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   Medical Consultants:   None  Other Consultants:   Physical therapy   Anti-Infectives:   Vancomycin 8/15--8/18  Aztreonam 8/15--8/18  levaquin 8/19 changed to Bactrim PO BID August 20th 2015 to complete for 5 more days post discharge  Code Status: Full  Family Communication: Pt at bedside  Disposition Plan: SNF    Discharge Exam: Filed Vitals:   10/21/13 0648  BP: 108/61  Pulse: 72  Temp: 98.2 F (36.8 C)  Resp: 20   Filed Vitals:   10/20/13 1958 10/20/13 2139 10/21/13 0648 10/21/13 0733  BP:  103/46 108/61   Pulse:  88 72   Temp:  98.9 F (37.2 C) 98.2 F (36.8 C)   TempSrc:  Oral Oral   Resp:  20 20   Height:      Weight:   150.141 kg (331 lb)   SpO2: 93% 95% 94% 95%    General: Pt  is alert, follows commands appropriately, not in acute distress Cardiovascular: Regular rate and rhythm, S1/S2 +, no murmurs, no rubs, no gallops Respiratory: Clear to auscultation bilaterally, no wheezing, no crackles, no rhonchi Abdominal: Soft, non tender, non distended, bowel sounds +, no guarding  Discharge Instructions  Discharge Instructions   Diet - low sodium heart healthy    Complete by:  As directed      Increase  activity slowly    Complete by:  As directed             Medication List         acidophilus Caps capsule  Take 1 capsule by mouth 2 (two) times daily.     albuterol 108 (90 BASE) MCG/ACT inhaler  Commonly known as:  PROVENTIL HFA;VENTOLIN HFA  - Inhale 1-2 puffs into the lungs every 4 (four) hours as needed. shortness of breath  - \  -      aspirin EC 81 MG tablet  Take 81 mg by mouth daily.     Biotin 2500 MCG Caps  Take 2,500 mcg by mouth daily.     budesonide 0.25 MG/2ML nebulizer solution  Commonly known as:  PULMICORT  Take 2 mLs (0.25 mg total) by nebulization 2 (two) times daily.     buPROPion 150 MG 24 hr tablet  Commonly known as:  WELLBUTRIN XL  Take 150 mg by mouth daily.     cholecalciferol 400 UNITS Tabs tablet  Commonly known as:  VITAMIN D  Take 800 Units by mouth daily.     diphenoxylate-atropine 2.5-0.025 MG per tablet  Commonly known as:  LOMOTIL  Take 2 tablets by mouth 4 (four) times daily as needed for diarrhea or loose stools.     FLUoxetine 20 MG capsule  Commonly known as:  PROZAC  Take 20 mg by mouth daily.     furosemide 20 MG tablet  Commonly known as:  LASIX  Take 20 mg by mouth 2 (two) times daily.     gabapentin 600 MG tablet  Commonly known as:  NEURONTIN  Take 600 mg by mouth 3 (three) times daily.     glipiZIDE 10 MG tablet  Commonly known as:  GLUCOTROL  Take 10 mg by mouth daily before breakfast.     HYDROmorphone 2 MG tablet  Commonly known as:  DILAUDID  Take 1 tablet (2 mg total) by mouth every 6 (six) hours as needed. For pain     insulin aspart 100 UNIT/ML injection  Commonly known as:  novoLOG  - Inject 0-10 Units into the skin 3 (three) times daily before meals. 0-149 0 units   - 150-200 2 units  - 201 -250 4 units  - 251-300 6 units   - 301-- 350 8 units  - 351-400 10 units   - 401 + call MD     insulin glargine 100 UNIT/ML injection  Commonly known as:  LANTUS  Inject 45 Units into the skin at  bedtime.     lamoTRIgine 25 MG tablet  Commonly known as:  LAMICTAL  Take 75 mg by mouth 2 (two) times daily.     methadone 10 MG tablet  Commonly known as:  DOLOPHINE  Take 1 tablet (10 mg total) by mouth 2 (two) times daily.     methylcellulose 1 % ophthalmic solution  Commonly known as:  ARTIFICIAL TEARS  Place 1 drop into both eyes daily as needed (dry eyes).     nystatin cream  Commonly  known as:  MYCOSTATIN  Apply 1 application topically 2 (two) times daily.     omeprazole 20 MG capsule  Commonly known as:  PRILOSEC  Take 20 mg by mouth daily.     predniSONE 10 MG tablet  Commonly known as:  DELTASONE  Take 50 mg tablet and taper down by 10 mg daily until completed     promethazine 25 MG tablet  Commonly known as:  PHENERGAN  Take 25 mg by mouth every 6 (six) hours as needed for nausea or vomiting.     psyllium 0.52 G capsule  Commonly known as:  REGULOID  Take 1.04 g by mouth 4 (four) times daily -  before meals and at bedtime.     sodium chloride 0.65 % nasal spray  Commonly known as:  OCEAN  Place 1 spray into the nose as needed for congestion. May keep at bedside     sulfamethoxazole-trimethoprim 800-160 MG per tablet  Commonly known as:  BACTRIM DS  Take 1 tablet by mouth 2 (two) times daily.           Follow-up Information   Schedule an appointment as soon as possible for a visit with Pearson Grippe, MD.   Specialty:  Internal Medicine   Contact information:   [address] [place] [address] [phone]        The results of significant diagnostics from this hospitalization (including imaging, microbiology, ancillary and laboratory) are listed below for reference.     Microbiology: Recent Results (from the past 240 hour(s))  CULTURE, BLOOD (ROUTINE X 2)     Status: None   Collection Time    10/16/13  2:30 PM      Result Value Ref Range Status   Specimen Description BLOOD LEFT ARM   Final   Special Requests BOTTLES DRAWN  AEROBIC AND ANAEROBIC 3.5CC EACH   Final   Culture  Setup Time     Final   Value: 10/16/2013 18:00     Performed at [facility]   Culture     Final   Value:        BLOOD CULTURE RECEIVED NO GROWTH TO DATE CULTURE WILL BE HELD FOR 5 DAYS BEFORE ISSUING A FINAL NEGATIVE REPORT     Performed at [facility]   Report Status PENDING   Incomplete  CULTURE, BLOOD (ROUTINE X 2)     Status: None   Collection Time    10/16/13  2:30 PM      Result Value Ref Range Status   Specimen Description BLOOD RIGHT ANTECUBITAL   Final   Special Requests BOTTLES DRAWN AEROBIC ONLY 2CC   Final   Culture  Setup Time     Final   Value: 10/16/2013 18:01     Performed at [facility]   Culture     Final   Value:        BLOOD CULTURE RECEIVED NO GROWTH TO DATE CULTURE WILL BE HELD FOR 5 DAYS BEFORE ISSUING A FINAL NEGATIVE REPORT     Performed at [facility]   Report Status PENDING   Incomplete  URINE CULTURE     Status: None   Collection Time    10/16/13  4:45 PM      Result Value Ref Range Status   Specimen Description URINE, CATHETERIZED   Final   Special Requests NONE   Final   Culture  Setup Time     Final  Value: 10/16/2013 22:03     Performed at [facility] Count     Final   Value: NO GROWTH     Performed at [facility]   Culture     Final   Value: NO GROWTH     Performed at [facility]   Report Status 10/18/2013 FINAL   Final  MRSA PCR SCREENING     Status: None   Collection Time    10/16/13  6:15 PM      Result Value Ref Range Status   MRSA by PCR NEGATIVE  NEGATIVE Final   Comment:            The GeneXpert MRSA Assay (FDA     approved for NASAL specimens     only), is one component of a     comprehensive MRSA colonization     surveillance program. It is not     intended to diagnose MRSA     infection nor to guide or     monitor treatment for     MRSA infections.  CULTURE, EXPECTORATED SPUTUM-ASSESSMENT     Status:  None   Collection Time    10/17/13 12:37 PM      Result Value Ref Range Status   Specimen Description SPUTUM   Final   Special Requests NONE   Final   Sputum evaluation     Final   Value: THIS SPECIMEN IS ACCEPTABLE. RESPIRATORY CULTURE REPORT TO FOLLOW.   Report Status 10/17/2013 FINAL   Final  CULTURE, RESPIRATORY (NON-EXPECTORATED)     Status: None   Collection Time    10/17/13 12:37 PM      Result Value Ref Range Status   Specimen Description SPUTUM   Final   Special Requests NONE   Final   Gram Stain     Final   Value: NO WBC SEEN     NO SQUAMOUS EPITHELIAL CELLS SEEN     RARE GRAM POSITIVE COCCI     IN PAIRS RARE YEAST     Performed at [facility]   Culture     Final   Value: MODERATE METHICILLIN RESISTANT STAPHYLOCOCCUS AUREUS     Note: RIFAMPIN AND GENTAMICIN SHOULD NOT BE USED AS SINGLE DRUGS FOR TREATMENT OF STAPH INFECTIONS. CRITICAL RESULT CALLED TO, READ BACK BY AND VERIFIED WITH: KIMBERLY K 8/20 @840  BY REAMM     ABUNDANT CANDIDA ALBICANS     Performed at [facility]   Report Status 10/21/2013 FINAL   Final   Organism ID, Bacteria METHICILLIN RESISTANT STAPHYLOCOCCUS AUREUS   Final     Labs: Basic Metabolic Panel:  Recent Labs Lab 10/16/13 1430 10/17/13 0404 10/18/13 0355 10/19/13 0407 10/21/13 0100  NA 135* 137 138 140 140  K 4.5 3.8 4.1 3.9 3.8  CL 95* 99 101 99 101  CO2 28 29 30  33* 32  GLUCOSE 143* 44* 191* 194* 112*  BUN 13 12 10 11 13   CREATININE 0.96 0.89 0.69 0.69 0.69  CALCIUM 10.0 9.1 9.0 9.9 10.3   Liver Function Tests:  Recent Labs Lab 10/16/13 1430  AST 11  ALT 8  ALKPHOS 147*  BILITOT 0.4  PROT 6.9  ALBUMIN 2.7*   No results found for this basename: LIPASE, AMYLASE,  in the last 168 hours No results found for this basename: AMMONIA,  in the last 168 hours CBC:  Recent Labs Lab 10/16/13 1430 10/17/13 0404 10/18/13 0355 10/19/13 0407 10/21/13  0100  WBC 18.8* 16.5* 11.4* 10.4 10.1  NEUTROABS 16.8*  --    --   --   --   HGB 12.4 11.1* 10.7* 10.2* 10.8*  HCT 40.7 36.0 35.7* 34.3* 34.4*  MCV 83.4 83.1 83.6 83.9 81.5  PLT PLATELET CLUMPS NOTED ON SMEAR, COUNT APPEARS ADEQUATE 258 206 240 280   Cardiac Enzymes: No results found for this basename: CKTOTAL, CKMB, CKMBINDEX, TROPONINI,  in the last 168 hours BNP: BNP (last 3 results) No results found for this basename: PROBNP,  in the last 8760 hours CBG:  Recent Labs Lab 10/20/13 0727 10/20/13 1153 10/20/13 1600 10/20/13 2130 10/21/13 0842  GLUCAP 199* 149* 171* 253* 93     SIGNED: Time coordinating discharge: Over 30 minutes  Debbora PrestoMAGICK-Grier Vu, MD  [facility] 10/21/2013, 10:21 AM Pager [phone]  If 7PM-7AM, please contact night-coverage [email or web address] Password [email or web address]

## 2013-10-22 LAB — CULTURE, BLOOD (ROUTINE X 2)
CULTURE: NO GROWTH
Culture: NO GROWTH

## 2014-10-19 ENCOUNTER — Encounter: Payer: Self-pay | Admitting: Gastroenterology

## 2014-12-09 ENCOUNTER — Ambulatory Visit: Payer: Medicare Other | Admitting: Gastroenterology

## 2014-12-29 ENCOUNTER — Encounter (HOSPITAL_COMMUNITY): Payer: Self-pay | Admitting: Emergency Medicine

## 2014-12-29 ENCOUNTER — Inpatient Hospital Stay (HOSPITAL_COMMUNITY)
Admission: EM | Admit: 2014-12-29 | Discharge: 2014-12-31 | DRG: 871 | Disposition: A | Discharge status: Skilled Nursing Facility | Payer: Medicare Other | Attending: Internal Medicine | Admitting: Internal Medicine

## 2014-12-29 ENCOUNTER — Emergency Department (HOSPITAL_COMMUNITY): Payer: Medicare Other

## 2014-12-29 DIAGNOSIS — D6861 Antiphospholipid syndrome: Secondary | ICD-10-CM | POA: Diagnosis present

## 2014-12-29 DIAGNOSIS — Z79891 Long term (current) use of opiate analgesic: Secondary | ICD-10-CM | POA: Diagnosis not present

## 2014-12-29 DIAGNOSIS — R509 Fever, unspecified: Secondary | ICD-10-CM | POA: Diagnosis not present

## 2014-12-29 DIAGNOSIS — Z880 Allergy status to penicillin: Secondary | ICD-10-CM

## 2014-12-29 DIAGNOSIS — Z794 Long term (current) use of insulin: Secondary | ICD-10-CM | POA: Diagnosis not present

## 2014-12-29 DIAGNOSIS — L0291 Cutaneous abscess, unspecified: Secondary | ICD-10-CM

## 2014-12-29 DIAGNOSIS — J189 Pneumonia, unspecified organism: Secondary | ICD-10-CM | POA: Diagnosis not present

## 2014-12-29 DIAGNOSIS — M3500 Sicca syndrome, unspecified: Secondary | ICD-10-CM | POA: Diagnosis present

## 2014-12-29 DIAGNOSIS — Z87891 Personal history of nicotine dependence: Secondary | ICD-10-CM | POA: Diagnosis not present

## 2014-12-29 DIAGNOSIS — E119 Type 2 diabetes mellitus without complications: Secondary | ICD-10-CM | POA: Diagnosis present

## 2014-12-29 DIAGNOSIS — F319 Bipolar disorder, unspecified: Secondary | ICD-10-CM | POA: Diagnosis present

## 2014-12-29 DIAGNOSIS — G7000 Myasthenia gravis without (acute) exacerbation: Secondary | ICD-10-CM | POA: Diagnosis present

## 2014-12-29 DIAGNOSIS — Y95 Nosocomial condition: Secondary | ICD-10-CM | POA: Diagnosis present

## 2014-12-29 DIAGNOSIS — Z7982 Long term (current) use of aspirin: Secondary | ICD-10-CM

## 2014-12-29 DIAGNOSIS — N179 Acute kidney failure, unspecified: Secondary | ICD-10-CM | POA: Diagnosis present

## 2014-12-29 DIAGNOSIS — L409 Psoriasis, unspecified: Secondary | ICD-10-CM | POA: Diagnosis present

## 2014-12-29 DIAGNOSIS — E872 Acidosis: Secondary | ICD-10-CM | POA: Diagnosis present

## 2014-12-29 DIAGNOSIS — J45909 Unspecified asthma, uncomplicated: Secondary | ICD-10-CM | POA: Diagnosis present

## 2014-12-29 DIAGNOSIS — Z79899 Other long term (current) drug therapy: Secondary | ICD-10-CM

## 2014-12-29 DIAGNOSIS — F419 Anxiety disorder, unspecified: Secondary | ICD-10-CM | POA: Diagnosis present

## 2014-12-29 DIAGNOSIS — Z66 Do not resuscitate: Secondary | ICD-10-CM | POA: Diagnosis present

## 2014-12-29 DIAGNOSIS — A419 Sepsis, unspecified organism: Principal | ICD-10-CM | POA: Diagnosis present

## 2014-12-29 LAB — COMPREHENSIVE METABOLIC PANEL
ALBUMIN: 2.6 g/dL — AB (ref 3.5–5.0)
ALT: 36 U/L (ref 14–54)
AST: 87 U/L — AB (ref 15–41)
Alkaline Phosphatase: 156 U/L — ABNORMAL HIGH (ref 38–126)
Anion gap: 6 (ref 5–15)
BILIRUBIN TOTAL: 1 mg/dL (ref 0.3–1.2)
BUN: 16 mg/dL (ref 6–20)
CHLORIDE: 100 mmol/L — AB (ref 101–111)
CO2: 29 mmol/L (ref 22–32)
Calcium: 8.7 mg/dL — ABNORMAL LOW (ref 8.9–10.3)
Creatinine, Ser: 1.18 mg/dL — ABNORMAL HIGH (ref 0.44–1.00)
GFR calc Af Amer: 53 mL/min — ABNORMAL LOW (ref >60)
GFR calc non Af Amer: 46 mL/min — ABNORMAL LOW (ref >60)
GLUCOSE: 99 mg/dL (ref 65–99)
POTASSIUM: 3.6 mmol/L (ref 3.5–5.1)
Sodium: 135 mmol/L (ref 135–145)
Total Protein: 5.6 g/dL — ABNORMAL LOW (ref 6.5–8.1)

## 2014-12-29 LAB — BASIC METABOLIC PANEL
Anion gap: 11 (ref 5–15)
BUN: 16 mg/dL (ref 6–20)
CO2: 26 mmol/L (ref 22–32)
Calcium: 9.5 mg/dL (ref 8.9–10.3)
Chloride: 97 mmol/L — ABNORMAL LOW (ref 101–111)
Creatinine, Ser: 1.26 mg/dL — ABNORMAL HIGH (ref 0.44–1.00)
GFR, EST AFRICAN AMERICAN: 49 mL/min — AB (ref >60)
GFR, EST NON AFRICAN AMERICAN: 42 mL/min — AB (ref >60)
GLUCOSE: 130 mg/dL — AB (ref 65–99)
POTASSIUM: 4.1 mmol/L (ref 3.5–5.1)
Sodium: 134 mmol/L — ABNORMAL LOW (ref 135–145)

## 2014-12-29 LAB — CBC WITH DIFFERENTIAL/PLATELET
BASOS ABS: 0 10*3/uL (ref 0.0–0.1)
BASOS ABS: 0 10*3/uL (ref 0.0–0.1)
BASOS PCT: 0 %
Basophils Relative: 0 %
Eosinophils Absolute: 0 10*3/uL (ref 0.0–0.7)
Eosinophils Absolute: 0 10*3/uL (ref 0.0–0.7)
Eosinophils Relative: 0 %
Eosinophils Relative: 0 %
HEMATOCRIT: 42.9 % (ref 36.0–46.0)
HEMATOCRIT: 39.8 % (ref 36.0–46.0)
Hemoglobin: 13.7 g/dL (ref 12.0–15.0)
Hemoglobin: 12.3 g/dL (ref 12.0–15.0)
LYMPHS ABS: 1.9 10*3/uL (ref 0.7–4.0)
LYMPHS PCT: 6 %
Lymphocytes Relative: 9 %
Lymphs Abs: 1.8 10*3/uL (ref 0.7–4.0)
MCH: 26.9 pg (ref 26.0–34.0)
MCH: 26.7 pg (ref 26.0–34.0)
MCHC: 31.9 g/dL (ref 30.0–36.0)
MCHC: 30.9 g/dL (ref 30.0–36.0)
MCV: 84.1 fL (ref 78.0–100.0)
MCV: 86.5 fL (ref 78.0–100.0)
MONO ABS: 0.8 10*3/uL (ref 0.1–1.0)
Monocytes Absolute: 1.6 10*3/uL — ABNORMAL HIGH (ref 0.1–1.0)
Monocytes Relative: 5 %
Monocytes Relative: 4 %
NEUTROS ABS: 16.4 10*3/uL — AB (ref 1.7–7.7)
NEUTROS PCT: 87 %
Neutro Abs: 28.2 10*3/uL — ABNORMAL HIGH (ref 1.7–7.7)
Neutrophils Relative %: 89 %
PLATELETS: 223 10*3/uL (ref 150–400)
Platelets: 185 10*3/uL (ref 150–400)
RBC: 5.1 MIL/uL (ref 3.87–5.11)
RBC: 4.6 MIL/uL (ref 3.87–5.11)
RDW: 15.4 % (ref 11.5–15.5)
RDW: 15.8 % — AB (ref 11.5–15.5)
WBC: 31.7 10*3/uL — AB (ref 4.0–10.5)
WBC: 19 10*3/uL — ABNORMAL HIGH (ref 4.0–10.5)

## 2014-12-29 LAB — GLUCOSE, CAPILLARY
GLUCOSE-CAPILLARY: 156 mg/dL — AB (ref 65–99)
Glucose-Capillary: 86 mg/dL (ref 65–99)
Glucose-Capillary: 129 mg/dL — ABNORMAL HIGH (ref 65–99)
Glucose-Capillary: 196 mg/dL — ABNORMAL HIGH (ref 65–99)

## 2014-12-29 LAB — PROCALCITONIN: Procalcitonin: 1.86 ng/mL

## 2014-12-29 LAB — I-STAT CG4 LACTIC ACID, ED: Lactic Acid, Venous: 3.03 mmol/L (ref 0.5–2.0)

## 2014-12-29 LAB — INFLUENZA PANEL BY PCR (TYPE A & B)
H1N1 flu by pcr: NOT DETECTED
INFLAPCR: NEGATIVE
INFLBPCR: NEGATIVE

## 2014-12-29 LAB — APTT: APTT: 35 s (ref 24–37)

## 2014-12-29 LAB — PROTIME-INR
INR: 1.18 (ref 0.00–1.49)
Prothrombin Time: 15.2 seconds (ref 11.6–15.2)

## 2014-12-29 LAB — I-STAT TROPONIN, ED: Troponin i, poc: 0 ng/mL (ref 0.00–0.08)

## 2014-12-29 LAB — LACTIC ACID, PLASMA: Lactic Acid, Venous: 1.6 mmol/L (ref 0.5–2.0)

## 2014-12-29 LAB — MRSA PCR SCREENING: MRSA BY PCR: POSITIVE — AB

## 2014-12-29 MED ORDER — DEXTROSE 5 % IV SOLN
2.0000 g | Freq: Once | INTRAVENOUS | Status: AC
  Administered 2014-12-29: 2 g via INTRAVENOUS
  Filled 2014-12-29: qty 2

## 2014-12-29 MED ORDER — NYSTATIN 100000 UNIT/GM EX CREA
1.0000 "application " | TOPICAL_CREAM | Freq: Two times a day (BID) | CUTANEOUS | Status: DC
  Administered 2014-12-29 – 2014-12-31 (×4): 1 via TOPICAL
  Filled 2014-12-29: qty 15

## 2014-12-29 MED ORDER — PIPERACILLIN-TAZOBACTAM 3.375 G IVPB
3.3750 g | Freq: Once | INTRAVENOUS | Status: DC
  Filled 2014-12-29: qty 50

## 2014-12-29 MED ORDER — INSULIN ASPART 100 UNIT/ML ~~LOC~~ SOLN
3.0000 [IU] | Freq: Three times a day (TID) | SUBCUTANEOUS | Status: DC
  Administered 2014-12-29 – 2014-12-31 (×7): 3 [IU] via SUBCUTANEOUS

## 2014-12-29 MED ORDER — INSULIN ASPART 100 UNIT/ML ~~LOC~~ SOLN
0.0000 [IU] | Freq: Every day | SUBCUTANEOUS | Status: DC
  Administered 2014-12-30: 0 [IU] via SUBCUTANEOUS

## 2014-12-29 MED ORDER — ACETAMINOPHEN 500 MG PO TABS
1000.0000 mg | ORAL_TABLET | Freq: Once | ORAL | Status: AC
  Administered 2014-12-29: 1000 mg via ORAL
  Filled 2014-12-29: qty 2

## 2014-12-29 MED ORDER — SODIUM CHLORIDE 0.9 % IV BOLUS (SEPSIS)
1000.0000 mL | Freq: Once | INTRAVENOUS | Status: AC
  Administered 2014-12-29: 1000 mL via INTRAVENOUS

## 2014-12-29 MED ORDER — ONDANSETRON HCL 4 MG/2ML IJ SOLN: 4.0000 mg | Freq: Four times a day (QID) | INTRAMUSCULAR | Status: DC | PRN

## 2014-12-29 MED ORDER — CHLORHEXIDINE GLUCONATE CLOTH 2 % EX PADS: 6.0000 | MEDICATED_PAD | Freq: Every day | CUTANEOUS | Status: DC

## 2014-12-29 MED ORDER — LAMOTRIGINE 25 MG PO TABS
25.0000 mg | ORAL_TABLET | Freq: Two times a day (BID) | ORAL | Status: DC
  Administered 2014-12-29 – 2014-12-31 (×5): 25 mg via ORAL
  Filled 2014-12-29 (×6): qty 1

## 2014-12-29 MED ORDER — SALINE SPRAY 0.65 % NA SOLN
1.0000 | NASAL | Status: DC | PRN
  Filled 2014-12-29: qty 44

## 2014-12-29 MED ORDER — BIOTIN 2500 MCG PO CAPS: 2500.0000 ug | ORAL_CAPSULE | Freq: Every day | ORAL | Status: DC

## 2014-12-29 MED ORDER — INSULIN GLARGINE 100 UNIT/ML ~~LOC~~ SOLN: 45.0000 [IU] | Freq: Every day | SUBCUTANEOUS | Status: DC

## 2014-12-29 MED ORDER — INSULIN ASPART 100 UNIT/ML ~~LOC~~ SOLN: 0.0000 [IU] | Freq: Three times a day (TID) | SUBCUTANEOUS | Status: DC

## 2014-12-29 MED ORDER — VANCOMYCIN HCL IN DEXTROSE 1-5 GM/200ML-% IV SOLN
1000.0000 mg | Freq: Once | INTRAVENOUS | Status: AC
  Administered 2014-12-29: 1000 mg via INTRAVENOUS
  Filled 2014-12-29: qty 200

## 2014-12-29 MED ORDER — CEFTAZIDIME 1 G IJ SOLR
1.0000 g | Freq: Three times a day (TID) | INTRAMUSCULAR | Status: DC
Start: 2014-12-29 — End: 2014-12-30
  Administered 2014-12-29 – 2014-12-30 (×3): 1 g via INTRAVENOUS
  Filled 2014-12-29 (×4): qty 1

## 2014-12-29 MED ORDER — ACETAMINOPHEN 325 MG PO TABS
650.0000 mg | ORAL_TABLET | Freq: Four times a day (QID) | ORAL | Status: DC | PRN
  Administered 2014-12-30: 650 mg via ORAL
  Filled 2014-12-29: qty 2

## 2014-12-29 MED ORDER — ALBUTEROL SULFATE (2.5 MG/3ML) 0.083% IN NEBU: 2.5000 mg | INHALATION_SOLUTION | RESPIRATORY_TRACT | Status: DC | PRN

## 2014-12-29 MED ORDER — INSULIN GLARGINE 100 UNIT/ML ~~LOC~~ SOLN
20.0000 [IU] | Freq: Every day | SUBCUTANEOUS | Status: DC
  Administered 2014-12-29: 20 [IU] via SUBCUTANEOUS
  Filled 2014-12-29: qty 0.2

## 2014-12-29 MED ORDER — NYSTATIN 100000 UNIT/GM EX POWD
1.0000 | Freq: Two times a day (BID) | CUTANEOUS | Status: DC | PRN
  Administered 2014-12-29 – 2014-12-31 (×3): 1 via TOPICAL
  Filled 2014-12-29 (×2): qty 15

## 2014-12-29 MED ORDER — INSULIN ASPART 100 UNIT/ML ~~LOC~~ SOLN
0.0000 [IU] | Freq: Three times a day (TID) | SUBCUTANEOUS | Status: DC
  Administered 2014-12-29: 2 [IU] via SUBCUTANEOUS
  Administered 2014-12-29 – 2014-12-30 (×2): 1 [IU] via SUBCUTANEOUS
  Administered 2014-12-30: 2 [IU] via SUBCUTANEOUS
  Administered 2014-12-30 – 2014-12-31 (×2): 1 [IU] via SUBCUTANEOUS

## 2014-12-29 MED ORDER — GABAPENTIN 300 MG PO CAPS
600.0000 mg | ORAL_CAPSULE | Freq: Three times a day (TID) | ORAL | Status: DC
Start: 2014-12-29 — End: 2014-12-31
  Administered 2014-12-29 – 2014-12-31 (×7): 600 mg via ORAL
  Filled 2014-12-29 (×7): qty 2

## 2014-12-29 MED ORDER — MUPIROCIN 2 % EX OINT
1.0000 "application " | TOPICAL_OINTMENT | Freq: Two times a day (BID) | CUTANEOUS | Status: DC
  Administered 2014-12-29 – 2014-12-31 (×4): 1 via NASAL
  Filled 2014-12-29: qty 22

## 2014-12-29 MED ORDER — BUPROPION HCL 100 MG PO TABS
100.0000 mg | ORAL_TABLET | Freq: Every day | ORAL | Status: DC
  Administered 2014-12-29 – 2014-12-31 (×3): 100 mg via ORAL
  Filled 2014-12-29 (×3): qty 1

## 2014-12-29 MED ORDER — SODIUM CHLORIDE 0.9 % IV SOLN
INTRAVENOUS | Status: AC
  Administered 2014-12-29: 07:00:00 via INTRAVENOUS

## 2014-12-29 MED ORDER — MORPHINE SULFATE ER 30 MG PO TBCR
30.0000 mg | EXTENDED_RELEASE_TABLET | Freq: Two times a day (BID) | ORAL | Status: DC
  Administered 2014-12-29 – 2014-12-31 (×5): 30 mg via ORAL
  Filled 2014-12-29 (×5): qty 1

## 2014-12-29 MED ORDER — FLUOXETINE HCL 20 MG PO CAPS
20.0000 mg | ORAL_CAPSULE | Freq: Every day | ORAL | Status: DC
  Administered 2014-12-29 – 2014-12-31 (×3): 20 mg via ORAL
  Filled 2014-12-29 (×3): qty 1

## 2014-12-29 MED ORDER — CHOLECALCIFEROL 10 MCG (400 UNIT) PO TABS
800.0000 [IU] | ORAL_TABLET | Freq: Every day | ORAL | Status: DC
Start: 2014-12-29 — End: 2014-12-31
  Administered 2014-12-29 – 2014-12-31 (×3): 800 [IU] via ORAL
  Filled 2014-12-29 (×3): qty 2

## 2014-12-29 MED ORDER — PROMETHAZINE HCL 25 MG PO TABS
25.0000 mg | ORAL_TABLET | Freq: Four times a day (QID) | ORAL | Status: DC | PRN
  Administered 2014-12-30: 25 mg via ORAL
  Filled 2014-12-29: qty 1

## 2014-12-29 MED ORDER — ALBUTEROL SULFATE HFA 108 (90 BASE) MCG/ACT IN AERS: 1.0000 | INHALATION_SPRAY | RESPIRATORY_TRACT | Status: DC | PRN

## 2014-12-29 MED ORDER — DIPHENOXYLATE-ATROPINE 2.5-0.025 MG PO TABS
1.0000 | ORAL_TABLET | Freq: Once | ORAL | Status: AC
  Administered 2014-12-29: 1 via ORAL
  Filled 2014-12-29: qty 1

## 2014-12-29 MED ORDER — DEXTROSE 5 % IV SOLN
2.0000 g | Freq: Three times a day (TID) | INTRAVENOUS | Status: DC
  Filled 2014-12-29: qty 2

## 2014-12-29 MED ORDER — ENOXAPARIN SODIUM 40 MG/0.4ML ~~LOC~~ SOLN
40.0000 mg | SUBCUTANEOUS | Status: DC
  Administered 2014-12-29 – 2014-12-31 (×3): 40 mg via SUBCUTANEOUS
  Filled 2014-12-29 (×3): qty 0.4

## 2014-12-29 MED ORDER — ACETAMINOPHEN 650 MG RE SUPP: 650.0000 mg | Freq: Four times a day (QID) | RECTAL | Status: DC | PRN

## 2014-12-29 MED ORDER — RISAQUAD PO CAPS
1.0000 | ORAL_CAPSULE | Freq: Two times a day (BID) | ORAL | Status: DC
  Administered 2014-12-29 – 2014-12-31 (×5): 1 via ORAL
  Filled 2014-12-29 (×6): qty 1

## 2014-12-29 MED ORDER — GUAIFENESIN ER 600 MG PO TB12: 600.0000 mg | ORAL_TABLET | Freq: Two times a day (BID) | ORAL | Status: DC | PRN

## 2014-12-29 MED ORDER — VANCOMYCIN HCL IN DEXTROSE 1-5 GM/200ML-% IV SOLN: 1000.0000 mg | Freq: Once | INTRAVENOUS | Status: DC

## 2014-12-29 MED ORDER — VANCOMYCIN HCL 10 G IV SOLR: 1250.0000 mg | Freq: Two times a day (BID) | INTRAVENOUS | Status: DC

## 2014-12-29 MED ORDER — POLYVINYL ALCOHOL 1.4 % OP SOLN
1.0000 [drp] | Freq: Every day | OPHTHALMIC | Status: DC | PRN
  Filled 2014-12-29: qty 15

## 2014-12-29 MED ORDER — ONDANSETRON HCL 4 MG PO TABS: 4.0000 mg | ORAL_TABLET | Freq: Four times a day (QID) | ORAL | Status: DC | PRN

## 2014-12-29 MED ORDER — HYDROMORPHONE HCL 4 MG PO TABS
4.0000 mg | ORAL_TABLET | ORAL | Status: DC | PRN
  Administered 2014-12-29 – 2014-12-30 (×3): 4 mg via ORAL
  Filled 2014-12-29 (×4): qty 1

## 2014-12-29 MED ORDER — VANCOMYCIN HCL IN DEXTROSE 1-5 GM/200ML-% IV SOLN
1000.0000 mg | Freq: Two times a day (BID) | INTRAVENOUS | Status: DC
Start: 2014-12-29 — End: 2014-12-30
  Administered 2014-12-29: 1000 mg via INTRAVENOUS
  Filled 2014-12-29: qty 200

## 2014-12-29 MED ORDER — DEXTROSE 5 % IV SOLN: 2.0000 g | Freq: Once | INTRAVENOUS | Status: DC

## 2014-12-29 MED ORDER — ASPIRIN EC 81 MG PO TBEC
81.0000 mg | DELAYED_RELEASE_TABLET | Freq: Every day | ORAL | Status: DC
  Administered 2014-12-29 – 2014-12-31 (×3): 81 mg via ORAL
  Filled 2014-12-29 (×3): qty 1

## 2014-12-29 NOTE — Progress Notes (Signed)
[facility] PROGRESS NOTE    Progress Note   [name] [name] DOB: 11-21-1945 DOA: 12/29/2014 PCP: Pearson Grippe, MD   Brief Narrative:   [name] is an 69 y.o. female past medical history of diabetes myasthenia gravis and antiphospholipid syndrome to come to the ED for fevers. Patient was treated with oral vancomycin for C. difficile over the last 3 days.Was found to be febrile with significant leukocytosis  Assessment/Plan:  Sepsis (HCC) due to HCAP (healthcare-associated pneumonia): Started empirically on vancomycin and aztreonam, continue the vancomycin and change aztreonam to [facility], she continues to have fevers, her leukocytosis improved. Lactic acidosis resolved with IV hydration and empiric antibiotic coverage, for calcitonin level was 1.8.  AKI: Baseline creatinine serum 0.6-8.8, likely prerenal in the setting of sepsis. Continue IV fluid hydration for 24 hours recheck a basic metabolic panel in the morning.  Psoriasis (a type of skin inflammation): Facility on no medications.  Antiphospholipid syndrome (HCC):   Continue aspirin.  Diabetes mellitus type 2, controlled (HCC) Change her long-acting insulin to twice a day, start sliding scale insulin.  DVT Prophylaxis: - heparin ordered.  Family Communication: none Disposition Plan: Home when stable. Code Status:     Code Status Orders        Start     Ordered   12/29/14 0531  Do not attempt resuscitation (DNR)   Continuous    Question Answer Comment  In the event of cardiac or respiratory ARREST Do not call a "code blue"   In the event of cardiac or respiratory ARREST Do not perform Intubation, CPR, defibrillation or ACLS   In the event of cardiac or respiratory ARREST Use medication by any route, position, wound care, and other measures to relive pain and suffering. May use oxygen, suction and manual treatment of airway obstruction as needed for comfort.      12/29/14 0530         IV Access:    Peripheral IV   Procedures and diagnostic studies:   Dg Chest 2 View  12/29/2014  CLINICAL DATA:  Acute onset of fever and congestion. Initial encounter. EXAM: CHEST  2 VIEW COMPARISON:  Chest radiograph performed 10/16/2013 FINDINGS: The lungs are well-aerated. Minimal left-sided opacities could reflect mild pneumonia. There is no evidence of pleural effusion or pneumothorax. The heart is normal in size; the mediastinal contour is within normal limits. No acute osseous abnormalities are seen. IMPRESSION: Minimal left-sided opacities could reflect mild pneumonia, depending on the patient's symptoms. Electronically Signed   By: Roanna Raider M.D.   On: 12/29/2014 02:38     Medical Consultants:    None.  Anti-Infectives:   Anti-infectives    Start     Dose/Rate Route Frequency Ordered Stop   12/29/14 2000  vancomycin (VANCOCIN) 1,250 mg in sodium chloride 0.9 % 250 mL IVPB     1,250 mg 166.7 mL/hr over 90 Minutes Intravenous Every 12 hours 12/29/14 0645     12/29/14 1400  aztreonam (AZACTAM) 2 g in dextrose 5 % 50 mL IVPB  Status:  Discontinued     2 g 100 mL/hr over 30 Minutes Intravenous 3 times per day 12/29/14 0647 12/29/14 1003   12/29/14 1400  cefTAZidime (FORTAZ) 1 g in dextrose 5 % 50 mL IVPB     1 g 100 mL/hr over 30 Minutes Intravenous 3 times per day 12/29/14 1003     12/29/14 0645  vancomycin (VANCOCIN) IVPB 1000 mg/200 mL premix     1,000  mg 200 mL/hr over 60 Minutes Intravenous  Once 12/29/14 0644     12/29/14 0545  aztreonam (AZACTAM) 2 g in dextrose 5 % 50 mL IVPB  Status:  Discontinued     2 g 100 mL/hr over 30 Minutes Intravenous  Once 12/29/14 0530 12/29/14 0553   12/29/14 0545  vancomycin (VANCOCIN) IVPB 1000 mg/200 mL premix  Status:  Discontinued     1,000 mg 200 mL/hr over 60 Minutes Intravenous  Once 12/29/14 0530 12/29/14 0553   12/29/14 0400  piperacillin-tazobactam (ZOSYN) IVPB 3.375 g  Status:  Discontinued     3.375 g 12.5  mL/hr over 240 Minutes Intravenous  Once 12/29/14 0346 12/29/14 0358   12/29/14 0400  vancomycin (VANCOCIN) IVPB 1000 mg/200 mL premix     1,000 mg 200 mL/hr over 60 Minutes Intravenous  Once 12/29/14 0346 12/29/14 0658   12/29/14 0400  aztreonam (AZACTAM) 2 g in dextrose 5 % 50 mL IVPB     2 g 100 mL/hr over 30 Minutes Intravenous  Once 12/29/14 0358 12/29/14 0433      Subjective:    [name] she relates she feels, wants to go home  Objective:    Filed Vitals:   12/29/14 0247 12/29/14 0248 12/29/14 0338 12/29/14 0514  BP: 126/59  90/50 101/43  Pulse: 120  106   Temp: 101.4 F (38.6 C) 101.4 F (38.6 C) 99 F (37.2 C)   TempSrc: Rectal Rectal Oral   Resp: 22  19   Height:    5' 8.5" (1.74 m)  Weight:    133.7 kg (294 lb 12.1 oz)  SpO2: 94%  93%    No intake or output data in the 24 hours ending 12/29/14 1003 Filed Weights   12/29/14 0514  Weight: 133.7 kg (294 lb 12.1 oz)    Exam: Gen:  NAD Cardiovascular:  RRR, No M/R/G Chest and lungs:   CTAB Abdomen:  Abdomen soft, NT/ND, + BS Extremities:  No C/E/C   Data Reviewed:    Labs: Basic Metabolic Panel:  Recent Labs Lab 12/29/14 0209 12/29/14 0840  NA 134* 135  K 4.1 3.6  CL 97* 100*  CO2 26 29  GLUCOSE 130* 99  BUN 16 16  CREATININE 1.26* 1.18*  CALCIUM 9.5 8.7*   GFR Estimated Creatinine Clearance: 65.7 mL/min (by C-G formula based on Cr of 1.18). Liver Function Tests:  Recent Labs Lab 12/29/14 0840  AST 87*  ALT 36  ALKPHOS 156*  BILITOT 1.0  PROT 5.6*  ALBUMIN 2.6*   No results for input(s): LIPASE, AMYLASE in the last 168 hours. No results for input(s): AMMONIA in the last 168 hours. Coagulation profile  Recent Labs Lab 12/29/14 0840  INR 1.18    CBC:  Recent Labs Lab 12/29/14 0209 12/29/14 0840  WBC 31.7* 19.0*  NEUTROABS 28.2* 16.4*  HGB 13.7 12.3  HCT 42.9 39.8  MCV 84.1 86.5  PLT 223 185   Cardiac Enzymes: No results for input(s): CKTOTAL, CKMB,  CKMBINDEX, TROPONINI in the last 168 hours. BNP (last 3 results) No results for input(s): PROBNP in the last 8760 hours. CBG:  Recent Labs Lab 12/29/14 0848  GLUCAP 86   D-Dimer: No results for input(s): DDIMER in the last 72 hours. Hgb A1c: No results for input(s): HGBA1C in the last 72 hours. Lipid Profile: No results for input(s): CHOL, HDL, LDLCALC, TRIG, CHOLHDL, LDLDIRECT in the last 72 hours. Thyroid function studies: No results for input(s): TSH, T4TOTAL, T3FREE, THYROIDAB  in the last 72 hours.  Invalid input(s): FREET3 Anemia work up: No results for input(s): VITAMINB12, FOLATE, FERRITIN, TIBC, IRON, RETICCTPCT in the last 72 hours. Sepsis Labs:  Recent Labs Lab 12/29/14 0209 12/29/14 0214 12/29/14 0840  PROCALCITON  --   --  1.86  WBC 31.7*  --  19.0*  LATICACIDVEN  --  3.03* 1.6   Microbiology Recent Results (from the past 240 hour(s))  Blood culture (routine x 2)     Status: None (Preliminary result)   Collection Time: 12/29/14  2:10 AM  Result Value Ref Range Status   Specimen Description BLOOD BLOOD RIGHT FOREARM  Final   Special Requests   Final    BOTTLES DRAWN AEROBIC AND ANAEROBIC 10CC Performed at [facility]    Culture PENDING  Incomplete   Report Status PENDING  Incomplete  MRSA PCR Screening     Status: Abnormal   Collection Time: 12/29/14  5:06 AM  Result Value Ref Range Status   MRSA by PCR POSITIVE (A) NEGATIVE Final    Comment:        The GeneXpert MRSA Assay (FDA approved for NASAL specimens only), is one component of a comprehensive MRSA colonization surveillance program. It is not intended to diagnose MRSA infection nor to guide or monitor treatment for MRSA infections. RESULT CALLED TO, READ BACK BY AND VERIFIED WITH: VERGELDEDIOS RN AT 0645 ON 10.27.16 BY SHUEA      Medications:   . acidophilus  1 capsule Oral BID  . aspirin EC  81 mg Oral Daily  . buPROPion  100 mg Oral Daily  . cefTAZidime (FORTAZ)  IV  1 g  Intravenous 3 times per day  . Chlorhexidine Gluconate Cloth  6 each Topical Q0600  . cholecalciferol  800 Units Oral Daily  . enoxaparin (LOVENOX) injection  40 mg Subcutaneous Q24H  . FLUoxetine  20 mg Oral Daily  . gabapentin  600 mg Oral TID  . insulin aspart  0-9 Units Subcutaneous TID WC  . insulin glargine  45 Units Subcutaneous QHS  . lamoTRIgine  25 mg Oral BID  . morphine  30 mg Oral BID  . mupirocin ointment  1 application Nasal BID  . nystatin cream  1 application Topical BID  . vancomycin  1,250 mg Intravenous Q12H  . vancomycin  1,000 mg Intravenous Once   Continuous Infusions: . sodium chloride 125 mL/hr at 12/29/14 1610    Time spent: 25 min   LOS: 0 days   Marinda Elk  [facility] Pager [phone]  *Please refer to [email or web address], password [email or web address] to get updated schedule on who will round on this patient, as hospitalists switch teams weekly. If 7PM-7AM, please contact night-coverage at [email or web address], password [email or web address] for any overnight needs.  12/29/2014, 10:03 AM

## 2014-12-29 NOTE — Progress Notes (Signed)
ANTIBIOTIC CONSULT NOTE - INITIAL  Pharmacy Consult for Vancomycin Indication: pneumonia  Allergies  Allergen Reactions  . Penicillins Other (See Comments)    Reaction=unknown Has tolerated amoxicillin Patient not able to answer questions regarding reaction    Patient Measurements: Height: 5' 8.5" (174 cm) Weight: 294 lb 12.1 oz (133.7 kg) IBW/kg (Calculated) : 65.05  Vital Signs: Temp: 99 F (37.2 C) (10/27 0338) Temp Source: Oral (10/27 0338) BP: 101/43 mmHg (10/27 0514) Pulse Rate: 106 (10/27 0338) Intake/Output from previous day:    Labs:  Recent Labs  12/29/14 0209 12/29/14 0840  WBC 31.7* 19.0*  HGB 13.7 12.3  PLT 223 185  CREATININE 1.26* 1.18*   Estimated Creatinine Clearance: 65.7 mL/min (by C-G formula based on Cr of 1.18). No results for input(s): VANCOTROUGH, VANCOPEAK, VANCORANDOM, GENTTROUGH, GENTPEAK, GENTRANDOM, TOBRATROUGH, TOBRAPEAK, TOBRARND, AMIKACINPEAK, AMIKACINTROU, AMIKACIN in the last 72 hours.   Microbiology: Recent Results (from the past 720 hour(s))  Blood culture (routine x 2)     Status: None (Preliminary result)   Collection Time: 12/29/14  2:10 AM  Result Value Ref Range Status   Specimen Description BLOOD BLOOD RIGHT FOREARM  Final   Special Requests   Final    BOTTLES DRAWN AEROBIC AND ANAEROBIC 10CC Performed at [facility]    Culture PENDING  Incomplete   Report Status PENDING  Incomplete  MRSA PCR Screening     Status: Abnormal   Collection Time: 12/29/14  5:06 AM  Result Value Ref Range Status   MRSA by PCR POSITIVE (A) NEGATIVE Final    Comment:        The GeneXpert MRSA Assay (FDA approved for NASAL specimens only), is one component of a comprehensive MRSA colonization surveillance program. It is not intended to diagnose MRSA infection nor to guide or monitor treatment for MRSA infections. RESULT CALLED TO, READ BACK BY AND VERIFIED WITH: VERGELDEDIOS RN AT 0645 ON 10.27.16 BY SHUEA     Medical  History: Past Medical History  Diagnosis Date  . MRSA cellulitis   . Psoriasis   . Obesities, morbid (HCC)   . Sjogren's disease (HCC)   . UTI (urinary tract infection)   . Antiphospholipid syndrome (HCC)   . Diverticulosis   . Anxiety   . Depression   . Asthma   . Ventral hernia   . Diabetes mellitus   . Myasthenia gravis   . Myasthenia gravis   . Myasthenia gravis   . Back pain, chronic     missing some vertebra per pt  . Complication of anesthesia     can't have muscle relaxers -my. gravis  . Bipolar disorder, unspecified (HCC)   . Seizures (HCC)     Assessment: 38 yoF presented to ED on 10/27 with fever and leukocytosis from nursing home.  PMH significant for DM, myasthenia gravis, Sjogren's syndrome, antiphospholipid syndrome.  She recently completed a course of PO vanc for Cdiff.  CXR concerning for pneumonia.  She is admitted with suspected sepsis from pneumonia.  Pharmacy is consulted to dose Vancomycin for pneumonia.  Aztreonam was changed to Ceftazidime per MD dosing.  10/27 >> vancomycin >> 10/27 >> aztreoman >> 10/27 10/27 >> Ceftazidime >>  Today, 12/29/2014: Tm 102 WBC elevated but improved at 19 SCr 1.18 with CrCl ~ 65 ml/min. Blood cultures pending.  Goal of Therapy:  Vancomycin trough level 15-20 mcg/ml  Plan:   Continue Ceftazidime 1g IV q8h per MD.  Vancomycin 2g IV loading dose, then 1g IV q12h.  Measure Vanc trough at steady state.  Follow up renal fxn, culture results, and clinical course.   Lynann Beaverhristine Yesika Rispoli PharmD, BCPS Pager [phone] 12/29/2014 10:29 AM

## 2014-12-29 NOTE — Clinical Social Work Note (Addendum)
Clinical Social Work Assessment  Patient Details  Name: [name] MRN: [id number] Date of Birth: [birth year]  Date of referral:  12/29/14               Reason for consult:  Facility Placement                Permission sought to share information with:  Chartered certified accountant granted to share information::  Yes, Verbal Permission Granted  Name::        Agency::  [place]   Relationship::     Contact Information:     Housing/Transportation Living arrangements for the past 2 months:  [place] of Information:  Patient Patient Interpreter Needed:  None Criminal Activity/Legal Involvement Pertinent to Current Situation/Hospitalization:  No - Comment as needed Significant Relationships:  Other(Comment) Corene Cornea Rainbow) Lives with:  Facility Resident Do you feel safe going back to the place where you live?  Yes Need for family participation in patient care:  No (Coment)  Care giving concerns:  Pt admitted from [facility] w/ long-term care.    Social Worker assessment / plan:  Pt admitted from [facility] and receiving long-term care.   BSW Intern met w/ pt at bedside. BSW Intern introduced self and explained role.   Pt stated residence at [facility] for the past 6 years. Pt agreeable to return to [facility] when medically ready.   CSW to call [facility] regarding pt d/c plan.   CSW to continue to follow and provide support to pt.  Employment status:  Unemployed Forensic scientist:  Information systems manager, Medicaid In [place] PT Recommendations:  [place] / Referral to community resources:  [place]  Patient/Family's Response to care:  Pt alert and oriented x4. Pt accepting of care.  Patient/Family's Understanding of and Emotional Response to Diagnosis, Current Treatment, and Prognosis:  Pt aware and understanding of diagnosis, current treatment,  and prognosis.   Emotional Assessment Appearance:  Appears stated age Attitude/Demeanor/Rapport:  Other (Appropriate ) Affect (typically observed):  Accepting, Appropriate Orientation:  Oriented to Self, Oriented to Place, Oriented to  Time, Oriented to Situation Alcohol / Substance use:  Not Applicable Psych involvement (Current and /or in the community):  No (Comment)  Discharge Needs  Concerns to be addressed:  Discharge Planning Concerns Readmission within the last 30 days:  No Current discharge risk:  None Barriers to Discharge:  Continued Medical Work up   [employer], Student-SW 12/29/2014, 2:17 PM

## 2014-12-29 NOTE — Progress Notes (Signed)
FVC= 1.0L (best of 3 attempts)  NIF= -30 (best of 3 attempts)

## 2014-12-29 NOTE — Progress Notes (Signed)
NIF performed X3. Results -22, -20 and -28. Patient gave good effort, but has some difficulty keeping a tight seal with her lips. Emilia BeckKaitlyn Szekalski, [place] informed of results .

## 2014-12-29 NOTE — ED Notes (Signed)
[facility] 69 [facility].  Per staff at facility, they took an axillary temp of 103.8 on the patient earlier on Wednesday, with HR at 122 and a systolic BP of 90.  [facility] was called to facility but pt refused.  Pt was given tylenol around 5 pm with no change in symptoms.  Pt has psoriasis over major of body including redness on right buttocks.  Pt is incontinent with no hx of sepsis.  Pt was on vancomycin and stopped treatment 2 days ago. Dx of C. Diff.

## 2014-12-29 NOTE — ED Provider Notes (Signed)
Patient states she had pneumonia and afterwards had C. difficile. She just finished a course of oral vancomycin a couple days ago. She states tonight she was noted to have tachycardia and fever to 103.8. She states during that time she did not feel bad at all. She states she has had a cough however it's not as bad as when she had pneumonia. She states the last time she coughed up anything was a couple days ago. She denies feeling shortness of breath. She has a stable hernia just superior to her umbilicus. She also has persistent psoriasis and has notable rash on her arms, abdomen, and legs. She also has redness between the areas of hyper keratotic patches.  Patient is pleasant and obese. She has a large grapefruit-sized swelling superior to her umbilicus that soft and nontender to palpation consistent with her history of hernia. She is noted to have multiple hyperkeratotic areas on her skin on her extremities, abdomen, and her chest. She also has some confluent redness between those areas. Some mild warmth. There is no oozing from the skin lesions. Patient appears to get short of breath when she talks however she is not coughing. When she talks her pulse ox drops to 89% on a nasal cannula oxygen.  Right abdomen   Right leg   Left leg     Right arm     Medical screening examination/treatment/procedure(s) were conducted as a shared visit with non-physician practitioner(s) and myself.  I personally evaluated the patient during the encounter.   EKG Interpretation   Date/Time:  Thursday December 29 2014 01:36:21 EDT Ventricular Rate:  112 PR Interval:  159 QRS Duration: 84 QT Interval:  323 QTC Calculation: 441 R Axis:   -17 Text Interpretation:  Sinus tachycardia Borderline left axis deviation Low  voltage, precordial leads Abnormal R-wave progression, early transition  Nonspecific repol abnormality, anterior leads No significant change since  last tracing 16 Oct 2013 Confirmed by [facility]   MD-I, Donaciano Range ([id number]) on  12/29/2014 2:11:06 AM       Devoria AlbeIva Aqueelah Cotrell, MD, Concha PyoFACEP   Eyob Godlewski, MD 12/29/14 [phone]

## 2014-12-29 NOTE — Evaluation (Addendum)
Clinical/Bedside Swallow Evaluation Patient Details  Name: [name] MRN: [id number] Date of Birth: 06-04-1945  Today's Date: 12/29/2014 Time: SLP Start Time (ACUTE ONLY): 1400 SLP Stop Time (ACUTE ONLY): 1433 SLP Time Calculation (min) (ACUTE ONLY): 33 min  Past Medical History:  Past Medical History  Diagnosis Date  . MRSA cellulitis   . Psoriasis   . Obesities, morbid (HCC)   . Sjogren's disease (HCC)   . UTI (urinary tract infection)   . Antiphospholipid syndrome (HCC)   . Diverticulosis   . Anxiety   . Depression   . Asthma   . Ventral hernia   . Diabetes mellitus   . Myasthenia gravis   . Myasthenia gravis   . Myasthenia gravis   . Back pain, chronic     missing some vertebra per pt  . Complication of anesthesia     can't have muscle relaxers -my. gravis  . Bipolar disorder, unspecified (HCC)   . Seizures (HCC)    Past Surgical History:  Past Surgical History  Procedure Laterality Date  . Abdominal hysterectomy    . Pilonidal cyst excision    . Cholecystectomy    . Appendectomy     HPI:  69 yo female adm to [facility] with AMS, pt diagnosed with sepsis.  PMH + for DM, myasthenia gravis, Sjogren's, seizures, bipolar, fever, + cdfic on ABX.  CXR 10/27 showed possible mild pna.  Swallow evaluation ordered.    Assessment / Plan / Recommendation Clinical Impression  Pt presents with mild oral dysphagia likely due to lack of dentition and her Sjoren's resulting in xerostomia.  Her speech is clear and voice is strong however.  No s/s of aspiration with po observed, although pt with HOB lowered to approximately 60* - encouraged her to eat upright but pt complained of back pain.  Pt does admit to occasionally coughing with foods only.  Educated pt to importance of hydration with her Sjogren's and staying upright as able for po.   Reviewed compensations for xerostomia.  Rec regular/thin - no follow up.   Please make sure pt obtains plenty of liquids and ice due to her  Sjogren's.      Aspiration Risk    Mild    Diet Recommendation Age appropriate regular solids;Thin   Medication Administration: Whole meds with liquid Compensations: Small sips/bites;Slow rate    Other  Recommendations Oral Care Recommendations: Oral care BID   Follow Up Recommendations    n/a   Frequency and Duration   n/a     Pertinent Vitals/Pain Low grade fever, decreased     Swallow Study Prior Functional Status   see hhx     General Date of Onset: 12/29/14 Other Pertinent Information: 69 yo female adm to [facility] with AMS, pt diagnosed with sepsis.  PMH + for DM, myasthenia gravis, Sjogren's, seizures, bipolar, fever, + cdfic on ABX.  CXR 10/27 showed possible mild pna.  Swallow evaluation ordered.  Type of Study: Bedside swallow evaluation Diet Prior to this Study: Regular;Thin liquids Temperature Spikes Noted: Yes (low grade temp) Respiratory Status: Room air History of Recent Intubation: No Behavior/Cognition: Alert;Pleasant mood;Confused Oral Cavity - Dentition: Edentulous Self-Feeding Abilities: Able to feed self Patient Positioning: Upright in bed Baseline Vocal Quality: Normal Volitional Cough: Strong Volitional Swallow: Able to elicit    Oral/Motor/Sensory Function Overall Oral Motor/Sensory Function: Appears within functional limits for tasks assessed   Ice Chips Ice chips: Not tested   Thin Liquid Thin Liquid: Within functional limits Presentation: Cup  Nectar Thick Nectar Thick Liquid: Not tested   Honey Thick Honey Thick Liquid: Not tested   Puree Puree: Within functional limits Presentation: Self Fed;Spoon   Solid   GO    Solid: Impaired Presentation: Self Fed Oral Phase Impairments: Reduced lingual movement/coordination;Impaired anterior to posterior transit Oral Phase Functional Implications: Other (comment) (delayed oral transiting, pt uses liquids to facilitate oral transiting)       [name], [name] Mignogna Ann Suliman Termini, MS [facility]  SLP [phone]

## 2014-12-29 NOTE — Clinical Social Work Note (Deleted)
Clinical Social Work Assessment  Patient Details  Name: [name] MRN: [id number] Date of Birth: [birth year]  Date of referral:                  Reason for consult:                   Permission sought to share information with:    Permission granted to share information::     Name::        Agency::     Relationship::     Contact Information:     Housing/Transportation Living arrangements for the past 2 months:    Source of Information:    Patient Interpreter Needed:    Criminal Activity/Legal Involvement Pertinent to Current Situation/Hospitalization:    Significant Relationships:    Lives with:    Do you feel safe going back to the place where you live?    Need for family participation in patient care:     Care giving concerns:  Pt admitted from [facility] w/ long-term care.   Social Worker assessment / plan:  Pt admitted from [facility] and receiving long-term care.   BSW Intern met w/ pt at bedside. BSW Intern introduced self and explained role.   Pt stated residence at [facility] for the past 6 years. Pt agreeable to return to [facility] when medically ready.   CSW to call [employer] regarding pt d/c plan.   CSW to continue to follow and provide support to pt.  Employment status:    Insurance information:    PT Recommendations:    Information / Referral to community resources:     Patient/Family's Response to care: Pt alert and oriented x4. Pt accepting of care.  Patient/Family's Understanding of and Emotional Response to Diagnosis, Current Treatment, and Prognosis: Pt aware and understanding of diagnosis, current treatment, and prognosis.   Emotional Assessment Appearance:    Attitude/Demeanor/Rapport:    Affect (typically observed):    Orientation:    Alcohol / Substance use:    Psych involvement (Current and /or in the community):     Discharge Needs  Concerns to be addressed:    Readmission within the  last 30 days:    Current discharge risk:    Barriers to Discharge:      Harlon Flor, Student-SW 12/29/2014, 1:37 PM

## 2014-12-29 NOTE — ED Notes (Signed)
Bed: WA17 Expected date:  Expected time:  Means of arrival:  Comments: EMS 69 yo female from SNF/temp 103,tachy

## 2014-12-29 NOTE — ED Notes (Signed)
Notified Kaitlyn,PA and RN, Terrance pt. I-stat Lactic Acid results 3.03.

## 2014-12-29 NOTE — ED Provider Notes (Signed)
CSN: [id number]     Arrival date & time 12/29/14  0125 History   First MD Initiated Contact with Patient 12/29/14 0129     Chief Complaint  Patient presents with  . Fever  . Tachycardia     (Consider location/radiation/quality/duration/timing/severity/associated sxs/prior Treatment) HPI Comments: Patient lives in a nursing home and reports finishing a course of oral Vancomycin 2 days ago that she was taking for C.Diff.   Patient is a 69 y.o. female presenting with fever. The history is provided by the patient. No language interpreter was used.  Fever Max temp prior to arrival:  103.8 Temp source:  Axillary Severity:  Severe Onset quality:  Sudden Duration:  1 day Timing:  Constant Progression:  Unchanged Chronicity:  New Relieved by:  Nothing Worsened by:  Nothing tried Ineffective treatments:  None tried Associated symptoms: no chest pain, no chills, no confusion, no congestion, no cough, no dysuria, no headaches, no myalgias, no rash, no rhinorrhea, no somnolence and no vomiting   Risk factors: no hx of cancer, no immunosuppression, no occupational exposure, no recent surgery, no recent travel and no sick contacts     Past Medical History  Diagnosis Date  . MRSA cellulitis   . Psoriasis   . Obesities, morbid (HCC)   . Sjogren's disease (HCC)   . UTI (urinary tract infection)   . Antiphospholipid syndrome (HCC)   . Diverticulosis   . Anxiety   . Depression   . Asthma   . Ventral hernia   . Diabetes mellitus   . Myasthenia gravis   . Myasthenia gravis   . Myasthenia gravis   . Back pain, chronic     missing some vertebra per pt  . Complication of anesthesia     can't have muscle relaxers -my. gravis  . Bipolar disorder, unspecified (HCC)   . Seizures [facility])    Past Surgical History  Procedure Laterality Date  . Abdominal hysterectomy    . Pilonidal cyst excision    . Cholecystectomy    . Appendectomy     History reviewed. No pertinent family  history. Social History  Substance Use Topics  . Smoking status: Former Games developer  . Smokeless tobacco: Never Used  . Alcohol Use: No   OB History    No data available     Review of Systems  Constitutional: Positive for fever. Negative for chills.  HENT: Negative for congestion and rhinorrhea.   Respiratory: Negative for cough.   Cardiovascular: Negative for chest pain.  Gastrointestinal: Negative for vomiting.  Genitourinary: Negative for dysuria.  Musculoskeletal: Negative for myalgias.  Skin: Negative for rash.  Neurological: Negative for headaches.  Psychiatric/Behavioral: Negative for confusion.  All other systems reviewed and are negative.     Allergies  Penicillins  Home Medications   Prior to Admission medications   Medication Sig Start Date End Date Taking? Authorizing Provider  acidophilus (RISAQUAD) CAPS Take 1 capsule by mouth 2 (two) times daily.      Historical Provider, MD  albuterol (PROVENTIL HFA;VENTOLIN HFA) 108 (90 BASE) MCG/ACT inhaler Inhale 1-2 puffs into the lungs every 4 (four) hours as needed. shortness of breath \      Historical Provider, MD  aspirin EC 81 MG tablet Take 81 mg by mouth daily.      Historical Provider, MD  Biotin 2500 MCG CAPS Take 2,500 mcg by mouth daily.    Historical Provider, MD  budesonide (PULMICORT) 0.25 MG/2ML nebulizer solution Take 2 mLs (0.25 mg total) by  nebulization 2 (two) times daily. 10/21/13   Dorothea Ogle, MD  buPROPion (WELLBUTRIN XL) 150 MG 24 hr tablet Take 150 mg by mouth daily.      Historical Provider, MD  cholecalciferol (VITAMIN D) 400 UNITS TABS Take 800 Units by mouth daily.      Historical Provider, MD  diphenoxylate-atropine (LOMOTIL) 2.5-0.025 MG per tablet Take 2 tablets by mouth 4 (four) times daily as needed for diarrhea or loose stools.    Historical Provider, MD  FLUoxetine (PROZAC) 20 MG capsule Take 20 mg by mouth daily.    Historical Provider, MD  furosemide (LASIX) 20 MG tablet Take 20 mg by  mouth 2 (two) times daily.      Historical Provider, MD  gabapentin (NEURONTIN) 600 MG tablet Take 600 mg by mouth 3 (three) times daily.    Historical Provider, MD  glipiZIDE (GLUCOTROL) 10 MG tablet Take 10 mg by mouth daily before breakfast.    Historical Provider, MD  HYDROmorphone (DILAUDID) 2 MG tablet Take 1 tablet (2 mg total) by mouth every 6 (six) hours as needed. For pain 10/21/13   Dorothea Ogle, MD  insulin aspart (NOVOLOG) 100 UNIT/ML injection Inject 0-10 Units into the skin 3 (three) times daily before meals. 0-149 0 units  150-200 2 units 201 -250 4 units 251-300 6 units  301-- 350 8 units 351-400 10 units  401 + call MD    Historical Provider, MD  insulin glargine (LANTUS) 100 UNIT/ML injection Inject 45 Units into the skin at bedtime.    Historical Provider, MD  lamoTRIgine (LAMICTAL) 25 MG tablet Take 75 mg by mouth 2 (two) times daily.     Historical Provider, MD  methadone (DOLOPHINE) 10 MG tablet Take 1 tablet (10 mg total) by mouth 2 (two) times daily. 10/21/13   Dorothea Ogle, MD  methylcellulose (ARTIFICIAL TEARS) 1 % ophthalmic solution Place 1 drop into both eyes daily as needed (dry eyes).     Historical Provider, MD  nystatin cream (MYCOSTATIN) Apply 1 application topically 2 (two) times daily.    Historical Provider, MD  omeprazole (PRILOSEC) 20 MG capsule Take 20 mg by mouth daily.      Historical Provider, MD  predniSONE (DELTASONE) 10 MG tablet Take 50 mg tablet and taper down by 10 mg daily until completed 10/21/13   Dorothea Ogle, MD  promethazine (PHENERGAN) 25 MG tablet Take 25 mg by mouth every 6 (six) hours as needed for nausea or vomiting.    Historical Provider, MD  psyllium (REGULOID) 0.52 G capsule Take 1.04 g by mouth 4 (four) times daily -  before meals and at bedtime.    Historical Provider, MD  sodium chloride (OCEAN) 0.65 % nasal spray Place 1 spray into the nose as needed for congestion. May keep at bedside    Historical Provider, MD   sulfamethoxazole-trimethoprim (BACTRIM DS) 800-160 MG per tablet Take 1 tablet by mouth 2 (two) times daily. 10/21/13   Dorothea Ogle, MD   BP 106/56 mmHg  Pulse 110  Temp(Src) 102 F (38.9 C) (Oral)  Resp 11  SpO2 88% Physical Exam  Constitutional: She is oriented to person, place, and time. She appears well-developed and well-nourished. No distress.  obese  HENT:  Head: Normocephalic and atraumatic.  Eyes: Conjunctivae and EOM are normal.  Neck: Normal range of motion.  Cardiovascular: Normal rate and regular rhythm.  Exam reveals no gallop and no friction rub.   No murmur heard. Pulmonary/Chest: Effort  normal and breath sounds normal. She has no wheezes. She has no rales. She exhibits no tenderness.  Abdominal: Soft. She exhibits no distension. There is no tenderness. There is no rebound.  Musculoskeletal: Normal range of motion.  Neurological: She is alert and oriented to person, place, and time. Coordination normal.  Speech is goal-oriented. Moves limbs without ataxia.   Skin: Skin is warm and dry.  Generalized scabbed lesions.   Psychiatric: She has a normal mood and affect. Her behavior is normal.  Nursing note and vitals reviewed.   ED Course  Procedures (including critical care time)  CRITICAL CARE Performed by: Emilia BeckKaitlyn Natayla Cadenhead   Total critical care time: 35 min  Critical care time was exclusive of separately billable procedures and treating other patients.  Critical care was necessary to treat or prevent imminent or life-threatening deterioration.  Critical care was time spent personally by me on the following activities: development of treatment plan with patient and/or surrogate as well as nursing, discussions with consultants, evaluation of patient's response to treatment, examination of patient, obtaining history from patient or surrogate, ordering and performing treatments and interventions, ordering and review of laboratory studies, ordering and review of  radiographic studies, pulse oximetry and re-evaluation of patient's condition.   Labs Review Labs Reviewed  CBC WITH DIFFERENTIAL/PLATELET - Abnormal; Notable for the following:    WBC 31.7 (*)    Neutro Abs 28.2 (*)    Monocytes Absolute 1.6 (*)    All other components within normal limits  I-STAT CG4 LACTIC ACID, ED - Abnormal; Notable for the following:    Lactic Acid, Venous 3.03 (*)    All other components within normal limits  CULTURE, BLOOD (ROUTINE X 2)  CULTURE, BLOOD (ROUTINE X 2)  BASIC METABOLIC PANEL  URINALYSIS, ROUTINE W REFLEX MICROSCOPIC (NOT AT [facility])  Rosezena SensorI-STAT TROPOININ, ED    Imaging Review Dg Chest 2 View  12/29/2014  CLINICAL DATA:  Acute onset of fever and congestion. Initial encounter. EXAM: CHEST  2 VIEW COMPARISON:  Chest radiograph performed 10/16/2013 FINDINGS: The lungs are well-aerated. Minimal left-sided opacities could reflect mild pneumonia. There is no evidence of pleural effusion or pneumothorax. The heart is normal in size; the mediastinal contour is within normal limits. No acute osseous abnormalities are seen. IMPRESSION: Minimal left-sided opacities could reflect mild pneumonia, depending on the patient's symptoms. Electronically Signed   By: Roanna RaiderJeffery  Chang M.D.   On: 12/29/2014 02:38   I have personally reviewed and evaluated these images and lab results as part of my medical decision-making.   EKG Interpretation   Date/Time:  Thursday December 29 2014 01:36:21 EDT Ventricular Rate:  112 PR Interval:  159 QRS Duration: 84 QT Interval:  323 QTC Calculation: 441 R Axis:   -17 Text Interpretation:  Sinus tachycardia Borderline left axis deviation Low  voltage, precordial leads Abnormal R-wave progression, early transition  Nonspecific repol abnormality, anterior leads No significant change since  last tracing 16 Oct 2013 Confirmed by [facility]  MD-I, IVA ([id number]) on  12/29/2014 2:11:06 AM      MDM   Final diagnoses:  HCAP  (healthcare-associated pneumonia)  Sepsis, due to unspecified organism (HCC)    2:10 AM Patient's labs, urine, and chest xray unremarkable for acute changes. Patient is febrile and tachycardic at this time. Patient will have tylenol for fever.   3:49 AM Patient's chest xray shows left side opacities concerning for pneumonia. Patient has elevated lactic acid at 3.03 and elevated WBC at 31.7. Patient receiving multiple  fluid bolus and vanc and zosyn. Patient will be admitted for sepsis.   Emilia Beck, PA-C 12/29/14 0350  Devoria Albe, MD 12/29/14 [phone]

## 2014-12-29 NOTE — H&P (Signed)
[facility] History and Physical  [name]:[id number] DOB: [birth year] DOA: 12/29/2014  Referring physician: Ms.Kaitlyn. PCP: Pearson Grippe, MD  Specialists: None.  Chief Complaint:  Fever.  HPI: [name] is a 69 y.o. female history of diabetes mellitus type 2, myasthenia gravis., Sjogren's syndrome, antiphospholipid syndrome and psoriasis was referred to the ER from the nursing home after patient was found to be febrile. Patient was recently treated on oral vancomycin for C. difficile and the course was over 3 days ago. Patient did not have any further episodes of diarrhea after the vancomycin course. The patient is found to be febrile and labs revealed significant leukocytosis. Patient is mildly hypertensive and was given fluid bolus followed with patient's blood pressure improved. X-rays reveal possible opacity concerning for pneumonia. Patient states that she has had cough previously 2 weeks ago but not any cough presently. Patient is also mildly hypoxic. Patient is admitted for sepsis probably from pneumonia. Patient denies any abdominal pain nausea vomiting or diarrhea at this time.   Review of Systems: As presented in the history of presenting illness, rest negative.  Past Medical History  Diagnosis Date  . MRSA cellulitis   . Psoriasis   . Obesities, morbid (HCC)   . Sjogren's disease (HCC)   . UTI (urinary tract infection)   . Antiphospholipid syndrome (HCC)   . Diverticulosis   . Anxiety   . Depression   . Asthma   . Ventral hernia   . Diabetes mellitus   . Myasthenia gravis   . Myasthenia gravis   . Myasthenia gravis   . Back pain, chronic     missing some vertebra per pt  . Complication of anesthesia     can't have muscle relaxers -my. gravis  . Bipolar disorder, unspecified (HCC)   . Seizures [facility])    Past Surgical History  Procedure Laterality Date  . Abdominal hysterectomy    . Pilonidal cyst excision    . Cholecystectomy    .  Appendectomy     Social History:  reports that she has quit smoking. She has never used smokeless tobacco. She reports that she does not drink alcohol or use illicit drugs. Where does patient live nursing home. Can patient participate in ADLs? Yes.  Allergies  Allergen Reactions  . Penicillins Other (See Comments)    Reaction=unknown Has tolerated amoxicillin Patient not able to answer questions regarding reaction    Family History:  Family History  Problem Relation Age of Onset  . Adopted: Yes      Prior to Admission medications   Medication Sig Start Date End Date Taking? Authorizing Provider  acidophilus (RISAQUAD) CAPS Take 1 capsule by mouth 2 (two) times daily.     Yes Historical Provider, MD  aspirin EC 81 MG tablet Take 81 mg by mouth daily.     Yes Historical Provider, MD  Biotin 2500 MCG CAPS Take 2,500 mcg by mouth daily.   Yes Historical Provider, MD  buPROPion (WELLBUTRIN) 100 MG tablet Take 100 mg by mouth every morning.   Yes Historical Provider, MD  cholecalciferol (VITAMIN D) 400 UNITS TABS Take 800 Units by mouth daily.     Yes Historical Provider, MD  Cranberry 425 MG CAPS Take 1 capsule by mouth 3 (three) times daily.   Yes Historical Provider, MD  diphenoxylate-atropine (LOMOTIL) 2.5-0.025 MG tablet Take 2 tablets by mouth 4 (four) times daily as needed for diarrhea or loose stools.   Yes Historical Provider, MD  FLUoxetine (  PROZAC) 20 MG capsule Take 20 mg by mouth daily.   Yes Historical Provider, MD  furosemide (LASIX) 20 MG tablet Take 20 mg by mouth 2 (two) times daily.     Yes Historical Provider, MD  gabapentin (NEURONTIN) 600 MG tablet Take 600 mg by mouth 3 (three) times daily.   Yes Historical Provider, MD  guaiFENesin (MUCINEX) 600 MG 12 hr tablet Take 600 mg by mouth 2 (two) times daily as needed for cough.   Yes Historical Provider, MD  HYDROmorphone (DILAUDID) 2 MG tablet Take 1 tablet (2 mg total) by mouth every 6 (six) hours as needed. For  pain Patient taking differently: Take 4 mg by mouth every 4 (four) hours as needed for moderate pain. For pain 10/21/13  Yes Dorothea Ogle, MD  insulin aspart (NOVOLOG) 100 UNIT/ML injection Inject 0-10 Units into the skin 3 (three) times daily before meals. 0-149 0 units  150-200 2 units 201 -250 4 units 251-300 6 units  301-- 350 8 units 351-400 10 units  401 + call MD   Yes Historical Provider, MD  insulin glargine (LANTUS) 100 UNIT/ML injection Inject 45 Units into the skin at bedtime.   Yes Historical Provider, MD  lamoTRIgine (LAMICTAL) 25 MG tablet Take 25 mg by mouth 2 (two) times daily.    Yes Historical Provider, MD  loperamide (IMODIUM A-D) 2 MG tablet Take 4 mg by mouth 4 (four) times daily as needed for diarrhea or loose stools.   Yes Historical Provider, MD  magnesium hydroxide (MILK OF MAGNESIA) 400 MG/5ML suspension Take 30 mLs by mouth daily as needed for mild constipation.   Yes Historical Provider, MD  Menthol-Zinc Oxide (CALMOSEPTINE) 0.44-20.6 % OINT Apply 1 application topically daily.   Yes Historical Provider, MD  morphine (KADIAN) 30 MG 24 hr capsule Take 30 mg by mouth 2 (two) times daily.   Yes Historical Provider, MD  nystatin (MYCOSTATIN/NYSTOP) 100000 UNIT/GM POWD Apply 1 Bottle topically 2 (two) times daily as needed (to moist areas under breast or rash).   Yes Historical Provider, MD  nystatin cream (MYCOSTATIN) Apply 1 application topically 2 (two) times daily.   Yes Historical Provider, MD  omeprazole (PRILOSEC) 20 MG capsule Take 20 mg by mouth daily.     Yes Historical Provider, MD  vancomycin (VANCOCIN) 125 MG capsule Take 125 mg by mouth 4 (four) times daily. For 14 days.   Yes Historical Provider, MD  albuterol (PROVENTIL HFA;VENTOLIN HFA) 108 (90 BASE) MCG/ACT inhaler Inhale 1-2 puffs into the lungs every 4 (four) hours as needed. shortness of breath \      Historical Provider, MD  budesonide (PULMICORT) 0.25 MG/2ML nebulizer solution Take 2 mLs (0.25 mg  total) by nebulization 2 (two) times daily. Patient not taking: Reported on 12/29/2014 10/21/13   Dorothea Ogle, MD  methadone (DOLOPHINE) 10 MG tablet Take 1 tablet (10 mg total) by mouth 2 (two) times daily. Patient not taking: Reported on 12/29/2014 10/21/13   Dorothea Ogle, MD  methylcellulose (ARTIFICIAL TEARS) 1 % ophthalmic solution Place 1 drop into both eyes daily as needed (dry eyes).     Historical Provider, MD  predniSONE (DELTASONE) 10 MG tablet Take 50 mg tablet and taper down by 10 mg daily until completed Patient not taking: Reported on 12/29/2014 10/21/13   Dorothea Ogle, MD  promethazine (PHENERGAN) 25 MG tablet Take 25 mg by mouth every 6 (six) hours as needed for nausea or vomiting.    Historical Provider,  MD  sodium chloride (OCEAN) 0.65 % nasal spray Place 1 spray into the nose as needed for congestion. May keep at bedside    Historical Provider, MD  sulfamethoxazole-trimethoprim (BACTRIM DS) 800-160 MG per tablet Take 1 tablet by mouth 2 (two) times daily. Patient not taking: Reported on 12/29/2014 10/21/13   Dorothea OgleIskra M Myers, MD    Physical Exam: Filed Vitals:   12/29/14 [id number] 12/29/14 0248 12/29/14 0338 12/29/14 0514  BP: 126/59  90/50 101/43  Pulse: 120  106   Temp: 101.4 F (38.6 C) 101.4 F (38.6 C) 99 F (37.2 C)   TempSrc: Rectal Rectal Oral   Resp: 22  19   Height:    5' 8.5" (1.74 m)  Weight:    133.7 kg (294 lb 12.1 oz)  SpO2: 94%  93%      General:  Obese not in distress.  Eyes: Anicteric no pallor.  ENT: No discharge from the ears eyes nose and mouth.  Neck: No mass felt. No JVD appreciated.  Cardiovascular: S1 and S2 heard.  Respiratory: No rhonchi or crepitations.  Abdomen: Has a hernia which does look obstructed. Soft nontender.  Skin: Chronic skin changes all over the body from psoriasis.  Musculoskeletal: No edema.  Psychiatric: Appears normal.  Neurologic: Alert awake oriented to time place and person. Moves all extremities.  Labs  on Admission:  Basic Metabolic Panel:  Recent Labs Lab 12/29/14 0209  NA 134*  K 4.1  CL 97*  CO2 26  GLUCOSE 130*  BUN 16  CREATININE 1.26*  CALCIUM 9.5   Liver Function Tests: No results for input(s): AST, ALT, ALKPHOS, BILITOT, PROT, ALBUMIN in the last 168 hours. No results for input(s): LIPASE, AMYLASE in the last 168 hours. No results for input(s): AMMONIA in the last 168 hours. CBC:  Recent Labs Lab 12/29/14 0209  WBC 31.7*  NEUTROABS 28.2*  HGB 13.7  HCT 42.9  MCV 84.1  PLT 223   Cardiac Enzymes: No results for input(s): CKTOTAL, CKMB, CKMBINDEX, TROPONINI in the last 168 hours.  BNP (last 3 results) No results for input(s): BNP in the last 8760 hours.  ProBNP (last 3 results) No results for input(s): PROBNP in the last 8760 hours.  CBG: No results for input(s): GLUCAP in the last 168 hours.  Radiological Exams on Admission: Dg Chest 2 View  12/29/2014  CLINICAL DATA:  Acute onset of fever and congestion. Initial encounter. EXAM: CHEST  2 VIEW COMPARISON:  Chest radiograph performed 10/16/2013 FINDINGS: The lungs are well-aerated. Minimal left-sided opacities could reflect mild pneumonia. There is no evidence of pleural effusion or pneumothorax. The heart is normal in size; the mediastinal contour is within normal limits. No acute osseous abnormalities are seen. IMPRESSION: Minimal left-sided opacities could reflect mild pneumonia, depending on the patient's symptoms. Electronically Signed   By: Roanna RaiderJeffery  Chang M.D.   On: 12/29/2014 02:38     Assessment/Plan Principal Problem:   Sepsis (HCC) Active Problems:   Psoriasis (a type of skin inflammation)   Antiphospholipid syndrome (HCC)   HCAP (healthcare-associated pneumonia)   Diabetes mellitus type 2, controlled (HCC)   ARF (acute renal failure) (HCC)   1. Sepsis - source not clear but probably from pneumonia given the x-ray changes. Patient has been placed on vancomycin and aztreonam. Check  influenza PCR and also swallow evaluation. Follow blood cultures. Patient also had a recent C. difficile for which patient was treated with oral vancomycin and over the last 3 days has not had  any further diarrhea. If patient develops diarrhea again and will need to check C. difficile. Continue with hydration and closely follow lactic acid levels and for calcitonin levels. UA is pending. 2. Acute renal failure - probably from recent C. difficile and hypotension. Patient is receiving fluids. Closely follow intake output and metabolic panel. UA is pending. 3. Diabetes mellitus type 2 - will continue on home dose of long-acting insulin with sliding scale coverage. 4. History of antiphospholipid antibody syndrome - on aspirin. 5. History of myasthenia gravis - presently on no medications. We will check negative inspiratory force and vital capacity every 8 hourly. 6. Psoriasis - presently on no medications.  I have reviewed patient's old charts and labs.   DVT Prophylaxis Lovenox.  Code Status: DO NOT RESUSCITATE.  Family Communication: Discussed with patient. Disposition Plan: Admit to inpatient.    Yanis Larin N. [facility] Pager [phone].  If 7PM-7AM, please contact night-coverage [email or web address] Password [email or web address] 12/29/2014, 5:31 AM

## 2014-12-29 NOTE — Progress Notes (Signed)
ANTIBIOTIC CONSULT NOTE - INITIAL  Pharmacy Consult for vancomycin, aztreonam Indication: pneumonia  Allergies  Allergen Reactions  . Penicillins Other (See Comments)    Reaction=unknown Has tolerated amoxicillin Patient not able to answer questions regarding reaction    Patient Measurements: Height: 5' 8.5" (174 cm) Weight: 294 lb 12.1 oz (133.7 kg) IBW/kg (Calculated) : 65.05 Adjusted Body Weight:   Vital Signs: Temp: 99 F (37.2 C) (10/27 0338) Temp Source: Oral (10/27 0338) BP: 101/43 mmHg (10/27 0514) Pulse Rate: 106 (10/27 0338) Intake/Output from previous day:   Intake/Output from this shift:    Labs:  Recent Labs  12/29/14 0209  WBC 31.7*  HGB 13.7  PLT 223  CREATININE 1.26*   Estimated Creatinine Clearance: 61.5 mL/min (by C-G formula based on Cr of 1.26). No results for input(s): VANCOTROUGH, VANCOPEAK, VANCORANDOM, GENTTROUGH, GENTPEAK, GENTRANDOM, TOBRATROUGH, TOBRAPEAK, TOBRARND, AMIKACINPEAK, AMIKACINTROU, AMIKACIN in the last 72 hours.   Microbiology: Recent Results (from the past 720 hour(s))  MRSA PCR Screening     Status: Abnormal   Collection Time: 12/29/14  5:06 AM  Result Value Ref Range Status   MRSA by PCR POSITIVE (A) NEGATIVE Final    Comment:        The GeneXpert MRSA Assay (FDA approved for NASAL specimens only), is one component of a comprehensive MRSA colonization surveillance program. It is not intended to diagnose MRSA infection nor to guide or monitor treatment for MRSA infections. RESULT CALLED TO, READ BACK BY AND VERIFIED WITH: VERGELDEDIOS RN AT 0645 ON 10.27.16 BY SHUEA     Medical History: Past Medical History  Diagnosis Date  . MRSA cellulitis   . Psoriasis   . Obesities, morbid (HCC)   . Sjogren's disease (HCC)   . UTI (urinary tract infection)   . Antiphospholipid syndrome (HCC)   . Diverticulosis   . Anxiety   . Depression   . Asthma   . Ventral hernia   . Diabetes mellitus   . Myasthenia gravis    . Myasthenia gravis   . Myasthenia gravis   . Back pain, chronic     missing some vertebra per pt  . Complication of anesthesia     can't have muscle relaxers -my. gravis  . Bipolar disorder, unspecified (HCC)   . Seizures (HCC)     Medications:  Anti-infectives    Start     Dose/Rate Route Frequency Ordered Stop   12/29/14 2000  vancomycin (VANCOCIN) 1,250 mg in sodium chloride 0.9 % 250 mL IVPB     1,250 mg 166.7 mL/hr over 90 Minutes Intravenous Every 12 hours 12/29/14 0645     12/29/14 0645  vancomycin (VANCOCIN) IVPB 1000 mg/200 mL premix     1,000 mg 200 mL/hr over 60 Minutes Intravenous  Once 12/29/14 0644     12/29/14 0545  aztreonam (AZACTAM) 2 g in dextrose 5 % 50 mL IVPB  Status:  Discontinued     2 g 100 mL/hr over 30 Minutes Intravenous  Once 12/29/14 0530 12/29/14 0553   12/29/14 0545  vancomycin (VANCOCIN) IVPB 1000 mg/200 mL premix  Status:  Discontinued     1,000 mg 200 mL/hr over 60 Minutes Intravenous  Once 12/29/14 0530 12/29/14 0553   12/29/14 0400  piperacillin-tazobactam (ZOSYN) IVPB 3.375 g  Status:  Discontinued     3.375 g 12.5 mL/hr over 240 Minutes Intravenous  Once 12/29/14 0346 12/29/14 0358   12/29/14 0400  vancomycin (VANCOCIN) IVPB 1000 mg/200 mL premix     1,000  mg 200 mL/hr over 60 Minutes Intravenous  Once 12/29/14 0346     12/29/14 0400  aztreonam (AZACTAM) 2 g in dextrose 5 % 50 mL IVPB     2 g 100 mL/hr over 30 Minutes Intravenous  Once 12/29/14 0358 12/29/14 0433     Assessment: Patient with HCAP.  First dose of antibiotics already given.  Goal of Therapy:  Vancomycin trough level 15-20 mcg/ml  Plan:  Measure antibiotic drug levels at steady state Follow up culture results Vancomycin 1gm x1 to make 2gm total, then 1250mg  iv q12hr  Aztreonam 2gm iv q8hr  Darlina GuysGrimsley Jr, Pria Klosinski Crowford 12/29/2014,6:46 AM

## 2014-12-29 NOTE — Progress Notes (Signed)
Pt complaining with migraine and doesn't want to do NIF and VC tonight.  RT to monitor and assess as needed.

## 2014-12-29 NOTE — NC FL2 (Signed)
[place] MEDICAID FL2 LEVEL OF CARE SCREENING TOOL     IDENTIFICATION  Patient Name: [name] Birthdate: [birth year] Sex: female Admission Date (Current Location): 12/29/2014  [place] [id number] L Facility and Address:  [facility],  [address], [place] [id number]      Provider Number: [id number]  Attending Physician Name and Address:  Marinda ElkAbraham Feliz Ortiz, MD  Relative Name and Phone Number:       Current Level of Care: Hospital Recommended Level of Care: Skilled Nursing Facility Prior Approval Number:    Date Approved/Denied:   PASRR Number: [id number]  Discharge Plan: SNF    Current Diagnoses: Patient Active Problem List   Diagnosis Date Noted  . HCAP (healthcare-associated pneumonia) 12/29/2014  . Diabetes mellitus type 2, controlled (HCC) 12/29/2014  . ARF (acute renal failure) (HCC) 12/29/2014  . AKI (acute kidney injury) (HCC) 12/29/2014  . Sepsis (HCC) 10/16/2013  . Aspiration pneumonia (HCAP) 10/16/2013  . Acute respiratory failure (HCC) 10/16/2013  . Psoriasis (a type of skin inflammation) 01/17/2011  . Cellulitis and abscess of buttock 01/17/2011  . Diabetes mellitus (HCC) 01/17/2011  . Obesity (BMI 30-39.9) 01/17/2011  . Myasthenia gravis (HCC) 01/17/2011  . Antiphospholipid syndrome (HCC) 01/17/2011    Orientation ACTIVITIES/SOCIAL BLADDER RESPIRATION    Self, Time, Situation, Place  Active Incontinent O2 (As needed) (2L )  BEHAVIORAL SYMPTOMS/MOOD NEUROLOGICAL BOWEL NUTRITION STATUS    Convulsions/Seizures (hx of seizures) Continent Diet (Diet Regular)  PHYSICIAN VISITS COMMUNICATION OF NEEDS Height & Weight Skin    Verbally 5\' 8"  (172.7 cm) 294 lbs. Normal          AMBULATORY STATUS RESPIRATION    Assist extensive O2 (As needed) (2L )      Personal Care Assistance Level of Assistance  Bathing, Dressing, Feeding Bathing Assistance: Limited assistance Feeding assistance: Limited  assistance Dressing Assistance: Limited assistance      Functional Limitations Info  Sight, Speech Sight Info: Adequate Hearing Info: Adequate Speech Info: Adequate       SPECIAL CARE FACTORS FREQUENCY  Speech therapy                   Additional Factors Info  Code Status, Allergies, Isolation Precautions, Psychotropic, Insulin Sliding Scale Code Status Info: DNR  Allergies Info: Penicillins   Insulin Sliding Scale Info: 4 x a day Isolation Precautions Info: 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 Requires contact precautions with each admission.      Current Medications (12/29/2014): Current Facility-Administered Medications  Medication Dose Route Frequency Provider Last Rate Last Dose  . 0.9 %  sodium chloride infusion   Intravenous Continuous Eduard ClosArshad N Kakrakandy, MD 125 mL/hr at 12/29/14 [id number]    . acetaminophen (TYLENOL) tablet 650 mg  650 mg Oral Q6H PRN Eduard ClosArshad N Kakrakandy, MD       Or  . acetaminophen (TYLENOL) suppository 650 mg  650 mg Rectal Q6H PRN Eduard ClosArshad N Kakrakandy, MD      . acidophilus (RISAQUAD) capsule 1 capsule  1 capsule Oral BID Eduard ClosArshad N Kakrakandy, MD   1 capsule at 12/29/14 1043  . albuterol (PROVENTIL) (2.5 MG/3ML) 0.083% nebulizer solution 2.5 mg  2.5 mg Nebulization Q4H PRN Eduard ClosArshad N Kakrakandy, MD      . aspirin EC tablet 81 mg  81 mg Oral Daily Eduard ClosArshad N Kakrakandy, MD   81 mg at 12/29/14 1043  . buPROPion (WELLBUTRIN) tablet 100 mg  100 mg Oral Daily Eduard ClosArshad N Kakrakandy, MD  100 mg at 12/29/14 1043  . cefTAZidime (FORTAZ) 1 g in dextrose 5 % 50 mL IVPB  1 g Intravenous 3 times per day Marinda Elk, MD      . Chlorhexidine Gluconate Cloth 2 % PADS 6 each  6 each Topical Q0600 Marinda Elk, MD   6 each at 12/29/14 0745  . cholecalciferol (VITAMIN D) tablet 800 Units  800 Units Oral Daily Eduard Clos, MD   800 Units at 12/29/14 1042  . enoxaparin (LOVENOX) injection 40 mg  40 mg Subcutaneous Q24H Eduard Clos, MD   40  mg at 12/29/14 1043  . FLUoxetine (PROZAC) capsule 20 mg  20 mg Oral Daily Eduard Clos, MD   20 mg at 12/29/14 1043  . gabapentin (NEURONTIN) capsule 600 mg  600 mg Oral TID Eduard Clos, MD   600 mg at 12/29/14 1042  . guaiFENesin (MUCINEX) 12 hr tablet 600 mg  600 mg Oral BID PRN Eduard Clos, MD      . HYDROmorphone (DILAUDID) tablet 4 mg  4 mg Oral Q4H PRN Eduard Clos, MD      . insulin aspart (novoLOG) injection 0-5 Units  0-5 Units Subcutaneous QHS Marinda Elk, MD      . insulin aspart (novoLOG) injection 0-9 Units  0-9 Units Subcutaneous TID WC Marinda Elk, MD      . insulin aspart (novoLOG) injection 3 Units  3 Units Subcutaneous TID WC Marinda Elk, MD      . insulin glargine (LANTUS) injection 20 Units  20 Units Subcutaneous QHS Marinda Elk, MD      . lamoTRIgine (LAMICTAL) tablet 25 mg  25 mg Oral BID Eduard Clos, MD   25 mg at 12/29/14 1042  . morphine (MS CONTIN) 12 hr tablet 30 mg  30 mg Oral BID Eduard Clos, MD   30 mg at 12/29/14 1043  . mupirocin ointment (BACTROBAN) 2 % 1 application  1 application Nasal BID Marinda Elk, MD   1 application at 12/29/14 1043  . nystatin (MYCOSTATIN/NYSTOP) topical powder 1 Bottle  1 Bottle Topical BID PRN Eduard Clos, MD   1 Bottle at 12/29/14 1204  . nystatin cream (MYCOSTATIN) 1 application  1 application Topical BID Eduard Clos, MD   1 application at 12/29/14 1204  . ondansetron (ZOFRAN) tablet 4 mg  4 mg Oral Q6H PRN Eduard Clos, MD       Or  . ondansetron [facility]) injection 4 mg  4 mg Intravenous Q6H PRN Eduard Clos, MD      . polyvinyl alcohol (LIQUIFILM TEARS) 1.4 % ophthalmic solution 1 drop  1 drop Both Eyes Daily PRN Eduard Clos, MD      . promethazine (PHENERGAN) tablet 25 mg  25 mg Oral Q6H PRN Eduard Clos, MD      . sodium chloride (OCEAN) 0.65 % nasal spray 1 spray  1 spray Nasal PRN Eduard Clos,  MD      . vancomycin (VANCOCIN) IVPB 1000 mg/200 mL premix  1,000 mg Intravenous Q12H Christine E Shade, RPH       Do not use this list as official medication orders. Please verify with discharge summary.  Discharge Medications:   Medication List    ASK your doctor about these medications        acidophilus Caps capsule  Take 1 capsule by mouth 2 (two) times daily.  albuterol 108 (90 BASE) MCG/ACT inhaler  Commonly known as:  PROVENTIL HFA;VENTOLIN HFA  Inhale 1-2 puffs into the lungs every 4 (four) hours as needed. shortness of breath \     aspirin EC 81 MG tablet  Take 81 mg by mouth daily.     Biotin 2500 MCG Caps  Take 2,500 mcg by mouth daily.     budesonide 0.25 MG/2ML nebulizer solution  Commonly known as:  PULMICORT  Take 2 mLs (0.25 mg total) by nebulization 2 (two) times daily.     buPROPion 100 MG tablet  Commonly known as:  WELLBUTRIN  Take 100 mg by mouth every morning.     CALMOSEPTINE 0.44-20.6 % Oint  Generic drug:  Menthol-Zinc Oxide  Apply 1 application topically daily.     cholecalciferol 400 UNITS Tabs tablet  Commonly known as:  VITAMIN D  Take 800 Units by mouth daily.     Cranberry 425 MG Caps  Take 1 capsule by mouth 3 (three) times daily.     diphenoxylate-atropine 2.5-0.025 MG tablet  Commonly known as:  LOMOTIL  Take 2 tablets by mouth 4 (four) times daily as needed for diarrhea or loose stools.     FLUoxetine 20 MG capsule  Commonly known as:  PROZAC  Take 20 mg by mouth daily.     furosemide 20 MG tablet  Commonly known as:  LASIX  Take 20 mg by mouth 2 (two) times daily.     gabapentin 600 MG tablet  Commonly known as:  NEURONTIN  Take 600 mg by mouth 3 (three) times daily.     guaiFENesin 600 MG 12 hr tablet  Commonly known as:  MUCINEX  Take 600 mg by mouth 2 (two) times daily as needed for cough.     HYDROmorphone 2 MG tablet  Commonly known as:  DILAUDID  Take 1 tablet (2 mg total) by mouth every 6 (six) hours as  needed. For pain     insulin aspart 100 UNIT/ML injection  Commonly known as:  novoLOG  Inject 0-10 Units into the skin 3 (three) times daily before meals. 0-149 0 units  150-200 2 units 201 -250 4 units 251-300 6 units  301-- 350 8 units 351-400 10 units  401 + call MD     insulin glargine 100 UNIT/ML injection  Commonly known as:  LANTUS  Inject 45 Units into the skin at bedtime.     lamoTRIgine 25 MG tablet  Commonly known as:  LAMICTAL  Take 25 mg by mouth 2 (two) times daily.     loperamide 2 MG tablet  Commonly known as:  IMODIUM A-D  Take 4 mg by mouth 4 (four) times daily as needed for diarrhea or loose stools.     magnesium hydroxide 400 MG/5ML suspension  Commonly known as:  MILK OF MAGNESIA  Take 30 mLs by mouth daily as needed for mild constipation.     methadone 10 MG tablet  Commonly known as:  DOLOPHINE  Take 1 tablet (10 mg total) by mouth 2 (two) times daily.     methylcellulose 1 % ophthalmic solution  Commonly known as:  ARTIFICIAL TEARS  Place 1 drop into both eyes daily as needed (dry eyes).     morphine 30 MG 24 hr capsule  Commonly known as:  KADIAN  Take 30 mg by mouth 2 (two) times daily.     nystatin cream  Commonly known as:  MYCOSTATIN  Apply 1 application topically 2 (two) times  daily.     nystatin 100000 UNIT/GM Powd  Apply 1 Bottle topically 2 (two) times daily as needed (to moist areas under breast or rash).     omeprazole 20 MG capsule  Commonly known as:  PRILOSEC  Take 20 mg by mouth daily.     predniSONE 10 MG tablet  Commonly known as:  DELTASONE  Take 50 mg tablet and taper down by 10 mg daily until completed     promethazine 25 MG tablet  Commonly known as:  PHENERGAN  Take 25 mg by mouth every 6 (six) hours as needed for nausea or vomiting.     sodium chloride 0.65 % nasal spray  Commonly known as:  OCEAN  Place 1 spray into the nose as needed for congestion. May keep at bedside     sulfamethoxazole-trimethoprim 800-160  MG per tablet  Commonly known as:  BACTRIM DS  Take 1 tablet by mouth 2 (two) times daily.     vancomycin 125 MG capsule  Commonly known as:  VANCOCIN  Take 125 mg by mouth 4 (four) times daily. For 14 days.        Relevant Imaging Results:  Relevant Lab Results:  Recent Labs    Additional Information SSN: [id number]  Darnise Montag A, LCSW

## 2014-12-30 ENCOUNTER — Other Ambulatory Visit (HOSPITAL_COMMUNITY): Payer: Medicare Other

## 2014-12-30 ENCOUNTER — Inpatient Hospital Stay (HOSPITAL_COMMUNITY): Payer: Medicare Other

## 2014-12-30 DIAGNOSIS — D6861 Antiphospholipid syndrome: Secondary | ICD-10-CM

## 2014-12-30 DIAGNOSIS — L03116 Cellulitis of left lower limb: Secondary | ICD-10-CM

## 2014-12-30 LAB — GLUCOSE, CAPILLARY
GLUCOSE-CAPILLARY: 146 mg/dL — AB (ref 65–99)
GLUCOSE-CAPILLARY: 193 mg/dL — AB (ref 65–99)
Glucose-Capillary: 174 mg/dL — ABNORMAL HIGH (ref 65–99)
Glucose-Capillary: 131 mg/dL — ABNORMAL HIGH (ref 65–99)

## 2014-12-30 LAB — BASIC METABOLIC PANEL
Anion gap: 7 (ref 5–15)
BUN: 12 mg/dL (ref 6–20)
CHLORIDE: 104 mmol/L (ref 101–111)
CO2: 29 mmol/L (ref 22–32)
Calcium: 8.9 mg/dL (ref 8.9–10.3)
Creatinine, Ser: 0.89 mg/dL (ref 0.44–1.00)
GFR calc Af Amer: >60 mL/min (ref >60)
GLUCOSE: 120 mg/dL — AB (ref 65–99)
POTASSIUM: 3.6 mmol/L (ref 3.5–5.1)
Sodium: 140 mmol/L (ref 135–145)

## 2014-12-30 MED ORDER — INSULIN GLARGINE 100 UNIT/ML ~~LOC~~ SOLN
20.0000 [IU] | Freq: Two times a day (BID) | SUBCUTANEOUS | Status: DC
  Administered 2014-12-30 – 2014-12-31 (×3): 20 [IU] via SUBCUTANEOUS
  Filled 2014-12-30 (×4): qty 0.2

## 2014-12-30 MED ORDER — IOHEXOL 300 MG/ML  SOLN
100.0000 mL | Freq: Once | INTRAMUSCULAR | Status: AC | PRN
  Administered 2014-12-30: 100 mL via INTRAVENOUS

## 2014-12-30 MED ORDER — LEVOFLOXACIN 500 MG PO TABS
500.0000 mg | ORAL_TABLET | Freq: Every day | ORAL | Status: DC
  Administered 2014-12-30 – 2014-12-31 (×2): 500 mg via ORAL
  Filled 2014-12-30 (×2): qty 1

## 2014-12-30 NOTE — Progress Notes (Signed)
[facility] PROGRESS NOTE    Progress Note   [name] EAV:[id number] DOB: 10/04/1945 DOA: 12/29/2014 PCP: Pearson Grippe, MD   Brief Narrative:   [name] is an 69 y.o. female past medical history of diabetes myasthenia gravis and antiphospholipid syndrome to come to the ED for fevers. Patient was treated with oral vancomycin for C. difficile over the last 3 days.Was found to be febrile with significant leukocytosis  Assessment/Plan:  Sepsis (HCC) due to HCAP (healthcare-associated pneumonia): Started empirically on vancomycin and aztreonam, continue the vancomycin and change aztreonam to [facility], she continues to has now defervesced, lactic acidosis resolved he will keep his saturations greater than 90% on room air. He is currently on antibiotic treatment Levaquin orally  Left hip and thigh erythema: Erythema, tenderness and warmth to touch, patient is on vancomycin. We'll get a CT scan of the pelvis to rule out an abscess.  AKI: Baseline creatinine serum 0.6-0.8, likely prerenal in the setting of sepsis. Resolved, with IV hydration.  Psoriasis (a type of skin inflammation): Facility on no medications.  Antiphospholipid syndrome (HCC):  Continue aspirin.  Diabetes mellitus type 2, controlled (HCC) Change her long-acting insulin to twice a day, start sliding scale insulin.  DVT Prophylaxis: - heparin ordered.  Family Communication: none Disposition Plan: Home in am Code Status:     Code Status Orders        Start     Ordered   12/29/14 0531  Do not attempt resuscitation (DNR)   Continuous    Question Answer Comment  In the event of cardiac or respiratory ARREST Do not call a "code blue"   In the event of cardiac or respiratory ARREST Do not perform Intubation, CPR, defibrillation or ACLS   In the event of cardiac or respiratory ARREST Use medication by any route, position, wound care, and other measures to relive pain and suffering. May use  oxygen, suction and manual treatment of airway obstruction as needed for comfort.      12/29/14 0530        IV Access:    Peripheral IV   Procedures and diagnostic studies:   Dg Chest 2 View  12/29/2014  CLINICAL DATA:  Acute onset of fever and congestion. Initial encounter. EXAM: CHEST  2 VIEW COMPARISON:  Chest radiograph performed 10/16/2013 FINDINGS: The lungs are well-aerated. Minimal left-sided opacities could reflect mild pneumonia. There is no evidence of pleural effusion or pneumothorax. The heart is normal in size; the mediastinal contour is within normal limits. No acute osseous abnormalities are seen. IMPRESSION: Minimal left-sided opacities could reflect mild pneumonia, depending on the patient's symptoms. Electronically Signed   By: Roanna Raider M.D.   On: 12/29/2014 02:38     Medical Consultants:    None.  Anti-Infectives:   Anti-infectives    Start     Dose/Rate Route Frequency Ordered Stop   12/29/14 2200  vancomycin (VANCOCIN) IVPB 1000 mg/200 mL premix     1,000 mg 200 mL/hr over 60 Minutes Intravenous Every 12 hours 12/29/14 1032     12/29/14 2000  vancomycin (VANCOCIN) 1,250 mg in sodium chloride 0.9 % 250 mL IVPB  Status:  Discontinued     1,250 mg 166.7 mL/hr over 90 Minutes Intravenous Every 12 hours 12/29/14 0645 12/29/14 1032   12/29/14 1400  aztreonam (AZACTAM) 2 g in dextrose 5 % 50 mL IVPB  Status:  Discontinued     2 g 100 mL/hr over 30 Minutes Intravenous 3 times per day  12/29/14 0647 12/29/14 1003   12/29/14 1400  cefTAZidime (FORTAZ) 1 g in dextrose 5 % 50 mL IVPB     1 g 100 mL/hr over 30 Minutes Intravenous 3 times per day 12/29/14 1003     12/29/14 0645  vancomycin (VANCOCIN) IVPB 1000 mg/200 mL premix     1,000 mg 200 mL/hr over 60 Minutes Intravenous  Once 12/29/14 0644 12/29/14 1155   12/29/14 0545  aztreonam (AZACTAM) 2 g in dextrose 5 % 50 mL IVPB  Status:  Discontinued     2 g 100 mL/hr over 30 Minutes Intravenous  Once  12/29/14 0530 12/29/14 0553   12/29/14 0545  vancomycin (VANCOCIN) IVPB 1000 mg/200 mL premix  Status:  Discontinued     1,000 mg 200 mL/hr over 60 Minutes Intravenous  Once 12/29/14 0530 12/29/14 0553   12/29/14 0400  piperacillin-tazobactam (ZOSYN) IVPB 3.375 g  Status:  Discontinued     3.375 g 12.5 mL/hr over 240 Minutes Intravenous  Once 12/29/14 0346 12/29/14 0358   12/29/14 0400  vancomycin (VANCOCIN) IVPB 1000 mg/200 mL premix     1,000 mg 200 mL/hr over 60 Minutes Intravenous  Once 12/29/14 0346 12/29/14 0658   12/29/14 0400  aztreonam (AZACTAM) 2 g in dextrose 5 % 50 mL IVPB     2 g 100 mL/hr over 30 Minutes Intravenous  Once 12/29/14 0358 12/29/14 [id number]      Subjective:    [name] FredericksonEleanor H [name] she is complaining of left hip pain warmth and tender area around the left hip skin.   Objective:    Filed Vitals:   12/29/14 0514 12/29/14 1800 12/29/14 2120 12/30/14 0541  BP: 101/43 126/56 93/55 95/57   Pulse:  88 85 106  Temp:  98.6 F (37 C) 99.2 F (37.3 C) 98.5 F (36.9 C)  TempSrc:  Oral Oral Oral  Resp:  20 20 20   Height: 5' 8.5" (1.74 m)     Weight: 133.7 kg (294 lb 12.1 oz)     SpO2:  93% 93% 96%    Intake/Output Summary (Last 24 hours) at 12/30/14 0840 Last data filed at 12/30/14 0825  Gross per 24 hour  Intake    920 ml  Output      0 ml  Net    920 ml   Filed Weights   12/29/14 0514  Weight: 133.7 kg (294 lb 12.1 oz)    Exam: Gen:  NAD Cardiovascular:  RRR, No M/R/G Chest and lungs:   CTAB Abdomen:  Abdomen soft, NT/ND, + BS Extremities:  No C/E/C   Data Reviewed:    Labs: Basic Metabolic Panel:  Recent Labs Lab 12/29/14 0209 12/29/14 0840 12/30/14 0520  NA 134* 135 140  K 4.1 3.6 3.6  CL 97* 100* 104  CO2 26 29 29   GLUCOSE 130* 99 120*  BUN 16 16 12   CREATININE 1.26* 1.18* 0.89  CALCIUM 9.5 8.7* 8.9   GFR Estimated Creatinine Clearance: 87.1 mL/min (by C-G formula based on Cr of 0.89). Liver Function Tests:  Recent Labs Lab  12/29/14 0840  AST 87*  ALT 36  ALKPHOS 156*  BILITOT 1.0  PROT 5.6*  ALBUMIN 2.6*   No results for input(s): LIPASE, AMYLASE in the last 168 hours. No results for input(s): AMMONIA in the last 168 hours. Coagulation profile  Recent Labs Lab 12/29/14 0840  INR 1.18    CBC:  Recent Labs Lab 12/29/14 0209 12/29/14 0840  WBC 31.7* 19.0*  NEUTROABS 28.2* 16.4*  HGB 13.7 12.3  HCT 42.9 39.8  MCV 84.1 86.5  PLT 223 185   Cardiac Enzymes: No results for input(s): CKTOTAL, CKMB, CKMBINDEX, TROPONINI in the last 168 hours. BNP (last 3 results) No results for input(s): PROBNP in the last 8760 hours. CBG:  Recent Labs Lab 12/29/14 0848 12/29/14 1240 12/29/14 1815 12/29/14 2116 12/30/14 0732  GLUCAP 86 156* 129* 196* 174*   D-Dimer: No results for input(s): DDIMER in the last 72 hours. Hgb A1c: No results for input(s): HGBA1C in the last 72 hours. Lipid Profile: No results for input(s): CHOL, HDL, LDLCALC, TRIG, CHOLHDL, LDLDIRECT in the last 72 hours. Thyroid function studies: No results for input(s): TSH, T4TOTAL, T3FREE, THYROIDAB in the last 72 hours.  Invalid input(s): FREET3 Anemia work up: No results for input(s): VITAMINB12, FOLATE, FERRITIN, TIBC, IRON, RETICCTPCT in the last 72 hours. Sepsis Labs:  Recent Labs Lab 12/29/14 0209 12/29/14 0214 12/29/14 0840  PROCALCITON  --   --  1.86  WBC 31.7*  --  19.0*  LATICACIDVEN  --  3.03* 1.6   Microbiology Recent Results (from the past 240 hour(s))  Blood culture (routine x 2)     Status: None (Preliminary result)   Collection Time: 12/29/14  2:10 AM  Result Value Ref Range Status   Specimen Description BLOOD BLOOD RIGHT FOREARM  Final   Special Requests   Final    BOTTLES DRAWN AEROBIC AND ANAEROBIC 10CC Performed at [facility]    Culture PENDING  Incomplete   Report Status PENDING  Incomplete  MRSA PCR Screening     Status: Abnormal   Collection Time: 12/29/14  5:06 AM  Result Value  Ref Range Status   MRSA by PCR POSITIVE (A) NEGATIVE Final    Comment:        The GeneXpert MRSA Assay (FDA approved for NASAL specimens only), is one component of a comprehensive MRSA colonization surveillance program. It is not intended to diagnose MRSA infection nor to guide or monitor treatment for MRSA infections. RESULT CALLED TO, READ BACK BY AND VERIFIED WITH: VERGELDEDIOS RN AT 0645 ON 10.27.16 BY SHUEA      Medications:   . acidophilus  1 capsule Oral BID  . aspirin EC  81 mg Oral Daily  . buPROPion  100 mg Oral Daily  . cefTAZidime (FORTAZ)  IV  1 g Intravenous 3 times per day  . Chlorhexidine Gluconate Cloth  6 each Topical Q0600  . cholecalciferol  800 Units Oral Daily  . enoxaparin (LOVENOX) injection  40 mg Subcutaneous Q24H  . FLUoxetine  20 mg Oral Daily  . gabapentin  600 mg Oral TID  . insulin aspart  0-5 Units Subcutaneous QHS  . insulin aspart  0-9 Units Subcutaneous TID WC  . insulin aspart  3 Units Subcutaneous TID WC  . insulin glargine  20 Units Subcutaneous QHS  . lamoTRIgine  25 mg Oral BID  . morphine  30 mg Oral BID  . mupirocin ointment  1 application Nasal BID  . nystatin cream  1 application Topical BID  . vancomycin  1,000 mg Intravenous Q12H   Continuous Infusions:    Time spent: 25 min   LOS: 1 day   Marinda Elk  [facility] Pager [phone]  *Please refer to [email or web address], password [email or web address] to get updated schedule on who will round on this patient, as hospitalists switch teams weekly. If 7PM-7AM, please contact night-coverage at [email or web address], password [email or web address] for any overnight needs.  12/30/2014, 8:40  AM

## 2014-12-30 NOTE — Care Management Important Message (Signed)
Important Message  Patient Details  Name: [name] MRN: [id number] Date of Birth: 06/13/1945   Medicare Important Message Given:  Yes-second notification given    Renie OraHawkins, Dayn Barich Smith 12/30/2014, 3:17 PMImportant Message  Patient Details  Name: [name] MRN: [id number] Date of Birth: 06/13/1945   Medicare Important Message Given:  Yes-second notification given    Renie OraHawkins, Adreena Willits Smith 12/30/2014, 3:17 PM

## 2014-12-30 NOTE — Progress Notes (Signed)
NIF - 40, VC 1.24L. Good pt effort. RT will continue to monitor.

## 2014-12-30 NOTE — Progress Notes (Signed)
NIF -32  VC 1.3 L  Patient demonstrated good patient effort. RT will continue to monitor.

## 2014-12-30 NOTE — Progress Notes (Signed)
NIF-35 and VC 1.3L, Pt demonstrated with good effort and technique.  RT to monitor and assess as needed.

## 2014-12-30 NOTE — Progress Notes (Signed)
CSW received notification that pt likely will be medically ready for d/c tomorrow.   [facility] and plans to return upon d/c.   [facility] that facility can accept pt back during the weekend.   CSW to continue to follow to provide support and assist with pt disposition needs.   Loletta SpecterSuzanna Kidd, MSW, LCSW Clinical Social Work [phone]

## 2014-12-31 DIAGNOSIS — E1121 Type 2 diabetes mellitus with diabetic nephropathy: Secondary | ICD-10-CM

## 2014-12-31 LAB — CBC
HCT: 39 % (ref 36.0–46.0)
Hemoglobin: 11.7 g/dL — ABNORMAL LOW (ref 12.0–15.0)
MCH: 26.5 pg (ref 26.0–34.0)
MCHC: 30 g/dL (ref 30.0–36.0)
MCV: 88.4 fL (ref 78.0–100.0)
PLATELETS: 175 10*3/uL (ref 150–400)
RBC: 4.41 MIL/uL (ref 3.87–5.11)
RDW: 16.3 % — AB (ref 11.5–15.5)
WBC: 7 10*3/uL (ref 4.0–10.5)

## 2014-12-31 LAB — GLUCOSE, CAPILLARY
GLUCOSE-CAPILLARY: 84 mg/dL (ref 65–99)
Glucose-Capillary: 136 mg/dL — ABNORMAL HIGH (ref 65–99)

## 2014-12-31 MED ORDER — DOXYCYCLINE HYCLATE 100 MG PO TABS: 100.0000 mg | ORAL_TABLET | Freq: Two times a day (BID) | ORAL | Status: DC

## 2014-12-31 MED ORDER — AMOXICILLIN-POT CLAVULANATE 875-125 MG PO TABS: 1.0000 | ORAL_TABLET | Freq: Two times a day (BID) | ORAL | Status: DC

## 2014-12-31 MED ORDER — SACCHAROMYCES BOULARDII 250 MG PO CAPS: 250.0000 mg | ORAL_CAPSULE | Freq: Two times a day (BID) | ORAL | Status: AC

## 2014-12-31 NOTE — Clinical Social Work Placement (Addendum)
   CLINICAL SOCIAL WORK PLACEMENT  NOTE 12/31/14 - DISCHARGED TO [facility]  Date:  12/31/2014  Patient Details  Name: [name] Fredericksonleanor H Badie MRN: [id number] Date of Birth: 12-29-1945  Clinical Social Work is seeking post-discharge placement for this patient at the   level of care (*CSW will initial, date and re-position this form in  chart as items are completed):      Patient/family provided with [facility] Clinical Social Work Department's list of facilities offering this level of care within the geographic area requested by the patient (or if unable, by the patient's family).      Patient/family informed of their freedom to choose among providers that offer the needed level of care, that participate in Medicare, Medicaid or managed care program needed by the patient, have an available bed and are willing to accept the patient.      Patient/family informed of [place]'s ownership interest in [facility] and [facility], as well as of the fact that they are under no obligation to receive care at these facilities.  PASRR submitted to EDS on       PASRR number received on       Existing PASRR number confirmed on       FL2 transmitted to all facilities in geographic area requested by pt/family on       FL2 transmitted to all facilities within larger geographic area on       Patient informed that his/her managed care company has contracts with or will negotiate with certain facilities, including the following:         YES - Patient/family informed of bed offers received.  [facility]     Physician recommends and patient chooses bed at      [facility]  12/[facility].  Patient to be transferred to facility by  ambulance     Patient family notified on  12/31/14 of transfer.  Name of family member notified:   Margaretha SeedsJason Rainbow by phone. Attempted to reach Mr. Donalee CitrinRainbow at phone number provided in  chart [phone]), however this is not a valid number.  CSW talked with patient regarding making contact with someone regarding her discharge.  Patient explained that Mr. Donalee CitrinRainbow is a friend and she is aware that the number we have is not valid. Patient plans to text her husband, Karleen HampshireDanny Thurow (they are separated) when she can get to her phone and inform him that she is discharging from hospital today.  PHYSICIAN       Additional Comment:    _______________________________________________ Cristobal Goldmannrawford, Dannah Ryles Bradley, LCSW 12/31/2014, 1:10 PM

## 2014-12-31 NOTE — Progress Notes (Signed)
RT Note: NIF -38, VC 1.2, good pt effort noted, RT to monitor.

## 2014-12-31 NOTE — Discharge Summary (Signed)
Physician Discharge Summary  [name] UJW:[id number] DOB: 1945-11-20 DOA: 12/29/2014  PCP: Pearson Grippe, MD  Admit date: 12/29/2014 Discharge date: 12/31/2014  Time spent: 35 minutes  Recommendations for Outpatient Follow-up:  1. Follow-up with his primary care doctor in a week  Discharge Diagnoses:  Principal Problem:   Sepsis (HCC) Active Problems:   Psoriasis (a type of skin inflammation)   Antiphospholipid syndrome (HCC)   HCAP (healthcare-associated pneumonia)   Diabetes mellitus type 2, controlled (HCC)   AKI (acute kidney injury) (HCC)   Discharge Condition: stable  Diet recommendation: carb modfied  Filed Weights   12/29/14 0514  Weight: 133.7 kg (294 lb 12.1 oz)    History of present illness:  69 year old with past medical history of diabetes type 2 Sjogren's syndrome, antiphospholipid syndrome and psoriasis was referred to the ER from the nursing home after patient was found to be febrile. Patient was recently treated on oral vancomycin for C. difficile and the course was over 3 days ago.   Hospital Course:  Sepsis due to possible healthcare associated pneumonia and possibly left hip cellulitis: She was started empirically on bank and aztreonam while she defervesced and her leukocytosis resolved she was changed to Augmentin and doxycycline. Her saturations remained greater than 90% on room air. Continue Augmentin for 4 additional days. CT scan of the abdomen and pelvis was done that showed no abscess but it did showed soft tissue swelling around the left hip since she will continue Doxy for 2 weeks.  Acute kidney injury: This likely prerenal resolved with IV fluid resuscitation.  Psoriasis: Unknown medications.  Antiphospholipid syndrome: Continue aspirin.  Controlled diabetes mellitus type 2: No changes were made to his medication.  Procedures:  CXR  CT of the hip  Consultations:  none  Discharge Exam: Filed Vitals:   12/31/14 0518  BP:  110/55  Pulse: 71  Temp: 98 F (36.7 C)  Resp: 18    General: A&O x3 Cardiovascular: RRR Respiratory: good air movement CTA B/L  Discharge Instructions   Discharge Instructions    Diet - low sodium heart healthy    Complete by:  As directed      Increase activity slowly    Complete by:  As directed           Current Discharge Medication List    START taking these medications   Details  amoxicillin-clavulanate (AUGMENTIN) 875-125 MG tablet Take 1 tablet by mouth 2 (two) times daily. Qty: 6 tablet, Refills: 0    doxycycline (VIBRA-TABS) 100 MG tablet Take 1 tablet (100 mg total) by mouth 2 (two) times daily. Qty: 20 tablet, Refills: 0      CONTINUE these medications which have NOT CHANGED   Details  acidophilus (RISAQUAD) CAPS Take 1 capsule by mouth 2 (two) times daily.      aspirin EC 81 MG tablet Take 81 mg by mouth daily.      buPROPion (WELLBUTRIN) 100 MG tablet Take 100 mg by mouth every morning.    cholecalciferol (VITAMIN D) 400 UNITS TABS Take 800 Units by mouth daily.      diphenoxylate-atropine (LOMOTIL) 2.5-0.025 MG tablet Take 2 tablets by mouth 4 (four) times daily as needed for diarrhea or loose stools.    FLUoxetine (PROZAC) 20 MG capsule Take 20 mg by mouth daily.    furosemide (LASIX) 20 MG tablet Take 20 mg by mouth 2 (two) times daily.      gabapentin (NEURONTIN) 600 MG tablet Take 600 mg by  mouth 3 (three) times daily.    guaiFENesin (MUCINEX) 600 MG 12 hr tablet Take 600 mg by mouth 2 (two) times daily as needed for cough.    HYDROmorphone (DILAUDID) 2 MG tablet Take 1 tablet (2 mg total) by mouth every 6 (six) hours as needed. For pain Qty: 10 tablet, Refills: 0    insulin aspart (NOVOLOG) 100 UNIT/ML injection Inject 0-10 Units into the skin 3 (three) times daily before meals. 0-149 0 units  150-200 2 units 201 -250 4 units 251-300 6 units  301-- 350 8 units 351-400 10 units  401 + call MD    insulin glargine (LANTUS) 100 UNIT/ML  injection Inject 45 Units into the skin at bedtime.    lamoTRIgine (LAMICTAL) 25 MG tablet Take 25 mg by mouth 2 (two) times daily.     loperamide (IMODIUM A-D) 2 MG tablet Take 4 mg by mouth 4 (four) times daily as needed for diarrhea or loose stools.    magnesium hydroxide (MILK OF MAGNESIA) 400 MG/5ML suspension Take 30 mLs by mouth daily as needed for mild constipation.    Menthol-Zinc Oxide (CALMOSEPTINE) 0.44-20.6 % OINT Apply 1 application topically daily.    morphine (KADIAN) 30 MG 24 hr capsule Take 30 mg by mouth 2 (two) times daily.    nystatin (MYCOSTATIN/NYSTOP) 100000 UNIT/GM POWD Apply 1 Bottle topically 2 (two) times daily as needed (to moist areas under breast or rash).    nystatin cream (MYCOSTATIN) Apply 1 application topically 2 (two) times daily.    omeprazole (PRILOSEC) 20 MG capsule Take 20 mg by mouth daily.      vancomycin (VANCOCIN) 125 MG capsule Take 125 mg by mouth 4 (four) times daily. For 14 days.    albuterol (PROVENTIL HFA;VENTOLIN HFA) 108 (90 BASE) MCG/ACT inhaler Inhale 1-2 puffs into the lungs every 4 (four) hours as needed. shortness of breath \\par       budesonide (PULMICORT) 0.25 MG/2ML nebulizer solution Take 2 mLs (0.25 mg total) by nebulization 2 (two) times daily. Qty: 60 mL, Refills: 12    methadone (DOLOPHINE) 10 MG tablet Take 1 tablet (10 mg total) by mouth 2 (two) times daily. Qty: 6 tablet, Refills: 0    methylcellulose (ARTIFICIAL TEARS) 1 % ophthalmic solution Place 1 drop into both eyes daily as needed (dry eyes).     predniSONE (DELTASONE) 10 MG tablet Take 50 mg tablet and taper down by 10 mg daily until completed Qty: 15 tablet, Refills: 0    promethazine (PHENERGAN) 25 MG tablet Take 25 mg by mouth every 6 (six) hours as needed for nausea or vomiting.    sodium chloride (OCEAN) 0.65 % nasal spray Place 1 spray into the nose as needed for congestion. May keep at bedside    sulfamethoxazole-trimethoprim (BACTRIM DS) 800-160  MG per tablet Take 1 tablet by mouth 2 (two) times daily. Qty: 10 tablet, Refills: 0      STOP taking these medications     Biotin 2500 MCG CAPS      Cranberry 425 MG CAPS        Allergies  Allergen Reactions  . Penicillins Other (See Comments)    Reaction=unknown Has tolerated amoxicillin Patient not able to answer questions regarding reaction   Follow-up Information    Follow up with Pearson Grippe, MD In 2 weeks.   Specialty:  Internal Medicine   Why:  hospital follow up   Contact information:   [address] [place] [address] [phone]  The results of significant diagnostics from this hospitalization (including imaging, microbiology, ancillary and laboratory) are listed below for reference.    Significant Diagnostic Studies: Dg Chest 2 View  12/29/2014  CLINICAL DATA:  Acute onset of fever and congestion. Initial encounter. EXAM: CHEST  2 VIEW COMPARISON:  Chest radiograph performed 10/16/2013 FINDINGS: The lungs are well-aerated. Minimal left-sided opacities could reflect mild pneumonia. There is no evidence of pleural effusion or pneumothorax. The heart is normal in size; the mediastinal contour is within normal limits. No acute osseous abnormalities are seen. IMPRESSION: Minimal left-sided opacities could reflect mild pneumonia, depending on the patient's symptoms. Electronically Signed   By: Roanna Raider M.D.   On: 12/29/2014 02:38   Ct Pelvis W Contrast  12/30/2014  CLINICAL DATA:  Left hip and thigh pain with psoriasis. Evaluate for abscess. EXAM: CT PELVIS WITH CONTRAST TECHNIQUE: Multidetector CT imaging of the pelvis was performed using the standard protocol following the bolus administration of intravenous contrast. CONTRAST:  OMNIPAQUE IOHEXOL 300 MG/ML  SOLN COMPARISON:  Abdominal CT 03/10/2006 FINDINGS: Chronic abdominal hernia, just right and and above the umbilicus, with herniation of small bowel, nonobstructive and  noninflamed. There is nonspecific subcutaneous reticulation of fat around both hips without visualized fluid collection or soft tissue gas (2 focal sites of gas in the subcutaneous left lower abdominal wall is likely from medication injection). Note that due to patient size, the superficial soft tissues of both hips could not be included in view. Negative for pelvic osteomyelitis. No acute vascular finding. No acute pelvic visceral finding. IMPRESSION: No visualized hip and upper thigh abscess, but due to patient size the bilateral superficial soft tissues are outside the field of view. Electronically Signed   By: Marnee Spring M.D.   On: 12/30/2014 12:46    Microbiology: Recent Results (from the past 240 hour(s))  Blood culture (routine x 2)     Status: None (Preliminary result)   Collection Time: 12/29/14  2:08 AM  Result Value Ref Range Status   Specimen Description BLOOD RIGHT ANTECUBITAL  Final   Special Requests BOTTLES DRAWN AEROBIC AND ANAEROBIC 10CC  Final   Culture   Final    NO GROWTH 1 DAY Performed at [facility]    Report Status PENDING  Incomplete  Blood culture (routine x 2)     Status: None (Preliminary result)   Collection Time: 12/29/14  2:10 AM  Result Value Ref Range Status   Specimen Description BLOOD BLOOD RIGHT FOREARM  Final   Special Requests BOTTLES DRAWN AEROBIC AND ANAEROBIC 10CC  Final   Culture   Final    NO GROWTH 1 DAY Performed at [facility]    Report Status PENDING  Incomplete  MRSA PCR Screening     Status: Abnormal   Collection Time: 12/29/14  5:06 AM  Result Value Ref Range Status   MRSA by PCR POSITIVE (A) NEGATIVE Final    Comment:        The GeneXpert MRSA Assay (FDA approved for NASAL specimens only), is one component of a comprehensive MRSA colonization surveillance program. It is not intended to diagnose MRSA infection nor to guide or monitor treatment for MRSA infections. RESULT CALLED TO, READ BACK BY AND VERIFIED  WITH: VERGELDEDIOS RN AT 0645 ON 10.27.16 BY SHUEA      Labs: Basic Metabolic Panel:  Recent Labs Lab 12/29/14 0209 12/29/14 0840 12/30/14 0520  NA 134* 135 140  K 4.1 3.6 3.6  CL 97*  100* 104  CO2 26 29 29   GLUCOSE 130* 99 120*  BUN 16 16 12   CREATININE 1.26* 1.18* 0.89  CALCIUM 9.5 8.7* 8.9   Liver Function Tests:  Recent Labs Lab 12/29/14 0840  AST 87*  ALT 36  ALKPHOS 156*  BILITOT 1.0  PROT 5.6*  ALBUMIN 2.6*   No results for input(s): LIPASE, AMYLASE in the last 168 hours. No results for input(s): AMMONIA in the last 168 hours. CBC:  Recent Labs Lab 12/29/14 0209 12/29/14 0840  WBC 31.7* 19.0*  NEUTROABS 28.2* 16.4*  HGB 13.7 12.3  HCT 42.9 39.8  MCV 84.1 86.5  PLT 223 185   Cardiac Enzymes: No results for input(s): CKTOTAL, CKMB, CKMBINDEX, TROPONINI in the last 168 hours. BNP: BNP (last 3 results) No results for input(s): BNP in the last 8760 hours.  ProBNP (last 3 results) No results for input(s): PROBNP in the last 8760 hours.  CBG:  Recent Labs Lab 12/30/14 0732 12/30/14 1128 12/30/14 1749 12/30/14 2150 12/31/14 0723  GLUCAP 174* 131* 146* 193* 84       Signed:  FELIZ ORTIZ, Donda Friedli  [facility] 12/31/2014, 10:56 AM

## 2015-01-03 LAB — CULTURE, BLOOD (ROUTINE X 2)
CULTURE: NO GROWTH
Culture: NO GROWTH

## 2015-02-08 ENCOUNTER — Ambulatory Visit: Payer: Medicare Other | Admitting: Gastroenterology

## 2015-05-28 ENCOUNTER — Encounter (HOSPITAL_COMMUNITY): Payer: Self-pay | Admitting: Emergency Medicine

## 2015-05-28 ENCOUNTER — Emergency Department (HOSPITAL_COMMUNITY): Payer: Medicare Other

## 2015-05-28 ENCOUNTER — Inpatient Hospital Stay (HOSPITAL_COMMUNITY)
Admission: EM | Admit: 2015-05-28 | Discharge: 2015-06-01 | DRG: 871 | Disposition: A | Discharge status: Skilled Nursing Facility | Payer: Medicare Other | Attending: Internal Medicine | Admitting: Internal Medicine

## 2015-05-28 DIAGNOSIS — D6861 Antiphospholipid syndrome: Secondary | ICD-10-CM | POA: Diagnosis present

## 2015-05-28 DIAGNOSIS — M549 Dorsalgia, unspecified: Secondary | ICD-10-CM | POA: Diagnosis present

## 2015-05-28 DIAGNOSIS — L409 Psoriasis, unspecified: Secondary | ICD-10-CM | POA: Diagnosis present

## 2015-05-28 DIAGNOSIS — A419 Sepsis, unspecified organism: Secondary | ICD-10-CM | POA: Diagnosis not present

## 2015-05-28 DIAGNOSIS — F418 Other specified anxiety disorders: Secondary | ICD-10-CM | POA: Diagnosis present

## 2015-05-28 DIAGNOSIS — J1008 Influenza due to other identified influenza virus with other specified pneumonia: Secondary | ICD-10-CM | POA: Diagnosis present

## 2015-05-28 DIAGNOSIS — J101 Influenza due to other identified influenza virus with other respiratory manifestations: Secondary | ICD-10-CM | POA: Diagnosis present

## 2015-05-28 DIAGNOSIS — Z7982 Long term (current) use of aspirin: Secondary | ICD-10-CM

## 2015-05-28 DIAGNOSIS — N390 Urinary tract infection, site not specified: Secondary | ICD-10-CM | POA: Diagnosis present

## 2015-05-28 DIAGNOSIS — J45901 Unspecified asthma with (acute) exacerbation: Secondary | ICD-10-CM | POA: Diagnosis present

## 2015-05-28 DIAGNOSIS — Z888 Allergy status to other drugs, medicaments and biological substances status: Secondary | ICD-10-CM

## 2015-05-28 DIAGNOSIS — Y95 Nosocomial condition: Secondary | ICD-10-CM | POA: Diagnosis present

## 2015-05-28 DIAGNOSIS — M3500 Sicca syndrome, unspecified: Secondary | ICD-10-CM | POA: Diagnosis present

## 2015-05-28 DIAGNOSIS — G7000 Myasthenia gravis without (acute) exacerbation: Secondary | ICD-10-CM | POA: Diagnosis present

## 2015-05-28 DIAGNOSIS — E860 Dehydration: Secondary | ICD-10-CM | POA: Diagnosis present

## 2015-05-28 DIAGNOSIS — J09X1 Influenza due to identified novel influenza A virus with pneumonia: Secondary | ICD-10-CM | POA: Diagnosis present

## 2015-05-28 DIAGNOSIS — J189 Pneumonia, unspecified organism: Secondary | ICD-10-CM | POA: Diagnosis present

## 2015-05-28 DIAGNOSIS — D72829 Elevated white blood cell count, unspecified: Secondary | ICD-10-CM | POA: Diagnosis present

## 2015-05-28 DIAGNOSIS — Z794 Long term (current) use of insulin: Secondary | ICD-10-CM

## 2015-05-28 DIAGNOSIS — J159 Unspecified bacterial pneumonia: Secondary | ICD-10-CM | POA: Diagnosis present

## 2015-05-28 DIAGNOSIS — G40909 Epilepsy, unspecified, not intractable, without status epilepticus: Secondary | ICD-10-CM | POA: Diagnosis present

## 2015-05-28 DIAGNOSIS — I959 Hypotension, unspecified: Secondary | ICD-10-CM | POA: Diagnosis present

## 2015-05-28 DIAGNOSIS — J9601 Acute respiratory failure with hypoxia: Secondary | ICD-10-CM | POA: Diagnosis present

## 2015-05-28 DIAGNOSIS — G8929 Other chronic pain: Secondary | ICD-10-CM | POA: Diagnosis present

## 2015-05-28 DIAGNOSIS — K219 Gastro-esophageal reflux disease without esophagitis: Secondary | ICD-10-CM | POA: Diagnosis present

## 2015-05-28 DIAGNOSIS — I10 Essential (primary) hypertension: Secondary | ICD-10-CM | POA: Diagnosis present

## 2015-05-28 DIAGNOSIS — B961 Klebsiella pneumoniae [K. pneumoniae] as the cause of diseases classified elsewhere: Secondary | ICD-10-CM | POA: Diagnosis present

## 2015-05-28 DIAGNOSIS — N179 Acute kidney failure, unspecified: Secondary | ICD-10-CM | POA: Diagnosis present

## 2015-05-28 DIAGNOSIS — Z6841 Body Mass Index (BMI) 40.0 and over, adult: Secondary | ICD-10-CM

## 2015-05-28 DIAGNOSIS — Z881 Allergy status to other antibiotic agents status: Secondary | ICD-10-CM

## 2015-05-28 DIAGNOSIS — Z880 Allergy status to penicillin: Secondary | ICD-10-CM

## 2015-05-28 DIAGNOSIS — E119 Type 2 diabetes mellitus without complications: Secondary | ICD-10-CM

## 2015-05-28 DIAGNOSIS — R000 Tachycardia, unspecified: Secondary | ICD-10-CM | POA: Diagnosis present

## 2015-05-28 DIAGNOSIS — R0602 Shortness of breath: Secondary | ICD-10-CM

## 2015-05-28 DIAGNOSIS — T501X5A Adverse effect of loop [high-ceiling] diuretics, initial encounter: Secondary | ICD-10-CM | POA: Diagnosis present

## 2015-05-28 DIAGNOSIS — Z87891 Personal history of nicotine dependence: Secondary | ICD-10-CM

## 2015-05-28 DIAGNOSIS — Z66 Do not resuscitate: Secondary | ICD-10-CM | POA: Diagnosis present

## 2015-05-28 DIAGNOSIS — E11649 Type 2 diabetes mellitus with hypoglycemia without coma: Secondary | ICD-10-CM | POA: Diagnosis present

## 2015-05-28 DIAGNOSIS — F319 Bipolar disorder, unspecified: Secondary | ICD-10-CM | POA: Diagnosis present

## 2015-05-28 LAB — CBC WITH DIFFERENTIAL/PLATELET
BASOS ABS: 0 10*3/uL (ref 0.0–0.1)
BASOS PCT: 0 %
EOS ABS: 0 10*3/uL (ref 0.0–0.7)
EOS PCT: 0 %
HCT: 45.8 % (ref 36.0–46.0)
Hemoglobin: 14.4 g/dL (ref 12.0–15.0)
Lymphocytes Relative: 2 %
Lymphs Abs: 0.4 10*3/uL — ABNORMAL LOW (ref 0.7–4.0)
MCH: 27.2 pg (ref 26.0–34.0)
MCHC: 31.4 g/dL (ref 30.0–36.0)
MCV: 86.4 fL (ref 78.0–100.0)
MONO ABS: 0.8 10*3/uL (ref 0.1–1.0)
Monocytes Relative: 5 %
Neutro Abs: 15.7 10*3/uL — ABNORMAL HIGH (ref 1.7–7.7)
Neutrophils Relative %: 93 %
PLATELETS: 136 10*3/uL — AB (ref 150–400)
RBC: 5.3 MIL/uL — AB (ref 3.87–5.11)
RDW: 15.8 % — AB (ref 11.5–15.5)
WBC: 16.9 10*3/uL — AB (ref 4.0–10.5)

## 2015-05-28 LAB — I-STAT CG4 LACTIC ACID, ED: LACTIC ACID, VENOUS: 2.13 mmol/L — AB (ref 0.5–2.0)

## 2015-05-28 MED ORDER — SODIUM CHLORIDE 0.9 % IV BOLUS (SEPSIS)
2000.0000 mL | Freq: Once | INTRAVENOUS | Status: AC
  Administered 2015-05-28: 2000 mL via INTRAVENOUS

## 2015-05-28 NOTE — ED Provider Notes (Signed)
CSN: [id number]     Arrival date & time 05/28/15  2305 History  By signing my name below, I, Phillis Haggis, attest that this documentation has been prepared under the direction and in the presence of Mertie Haslem Randall An, MD. Electronically Signed: Phillis Haggis, ED Scribe. 05/28/2015. 1:02 AM.  Chief Complaint  Patient presents with  . Code Sepsis   The history is provided by the patient. No language interpreter was used.  HPI Comments: [name] is a 70 y.o. Female with a hx of MRSA cellulitis, UTI, antiphospholipid syndrome, Sjogren's disease, diverticulosis, seizures, and myasthenia gravis brought in from a nursing home who presents to the Emergency Department complaining of cough and fever tmax 101.6 F onset one day ago. She states that her mouth is very dry. She denies that she is regularly on O2 at the nursing home. Pt denies abdominal pain.   Past Medical History  Diagnosis Date  . MRSA cellulitis   . Psoriasis   . Obesities, morbid (HCC)   . Sjogren's disease (HCC)   . UTI (urinary tract infection)   . Antiphospholipid syndrome (HCC)   . Diverticulosis   . Anxiety   . Depression   . Asthma   . Ventral hernia   . Diabetes mellitus   . Myasthenia gravis   . Myasthenia gravis   . Myasthenia gravis   . Back pain, chronic     missing some vertebra per pt  . Complication of anesthesia     can't have muscle relaxers -my. gravis  . Bipolar disorder, unspecified (HCC)   . Seizures [facility])    Past Surgical History  Procedure Laterality Date  . Abdominal hysterectomy    . Pilonidal cyst excision    . Cholecystectomy    . Appendectomy     Family History  Problem Relation Age of Onset  . Adopted: Yes   Social History  Substance Use Topics  . Smoking status: Former Games developer  . Smokeless tobacco: Never Used  . Alcohol Use: No   OB History    No data available     Review of Systems  Constitutional: Positive for fever.  Respiratory: Positive for cough.    Gastrointestinal: Negative for abdominal pain.  All other systems reviewed and are negative.  Allergies  Penicillins  Home Medications   Prior to Admission medications   Medication Sig Start Date End Date Taking? Authorizing Provider  acidophilus (RISAQUAD) CAPS Take 1 capsule by mouth 2 (two) times daily.      Historical Provider, MD  albuterol (PROVENTIL HFA;VENTOLIN HFA) 108 (90 BASE) MCG/ACT inhaler Inhale 1-2 puffs into the lungs every 4 (four) hours as needed. shortness of breath \      Historical Provider, MD  amoxicillin-clavulanate (AUGMENTIN) 875-125 MG tablet Take 1 tablet by mouth 2 (two) times daily. 12/31/14   Marinda Elk, MD  aspirin EC 81 MG tablet Take 81 mg by mouth daily.      Historical Provider, MD  budesonide (PULMICORT) 0.25 MG/2ML nebulizer solution Take 2 mLs (0.25 mg total) by nebulization 2 (two) times daily. Patient not taking: Reported on 12/29/2014 10/21/13   Dorothea Ogle, MD  buPROPion [facility]) 100 MG tablet Take 100 mg by mouth every morning.    Historical Provider, MD  cholecalciferol (VITAMIN D) 400 UNITS TABS Take 800 Units by mouth daily.      Historical Provider, MD  diphenoxylate-atropine (LOMOTIL) 2.5-0.025 MG tablet Take 2 tablets by mouth 4 (four) times daily as needed for  diarrhea or loose stools.    Historical Provider, MD  doxycycline (VIBRA-TABS) 100 MG tablet Take 1 tablet (100 mg total) by mouth 2 (two) times daily. 12/31/14   Marinda Elk, MD  FLUoxetine (PROZAC) 20 MG capsule Take 20 mg by mouth daily.    Historical Provider, MD  furosemide (LASIX) 20 MG tablet Take 20 mg by mouth 2 (two) times daily.      Historical Provider, MD  gabapentin (NEURONTIN) 600 MG tablet Take 600 mg by mouth 3 (three) times daily.    Historical Provider, MD  guaiFENesin (MUCINEX) 600 MG 12 hr tablet Take 600 mg by mouth 2 (two) times daily as needed for cough.    Historical Provider, MD  HYDROmorphone (DILAUDID) 2 MG tablet Take 1 tablet (2  mg total) by mouth every 6 (six) hours as needed. For pain Patient taking differently: Take 4 mg by mouth every 4 (four) hours as needed for moderate pain. For pain 10/21/13   Dorothea Ogle, MD  insulin aspart (NOVOLOG) 100 UNIT/ML injection Inject 0-10 Units into the skin 3 (three) times daily before meals. 0-149 0 units  150-200 2 units 201 -250 4 units 251-300 6 units  301-- 350 8 units 351-400 10 units  401 + call MD    Historical Provider, MD  insulin glargine (LANTUS) 100 UNIT/ML injection Inject 45 Units into the skin at bedtime.    Historical Provider, MD  lamoTRIgine (LAMICTAL) 25 MG tablet Take 25 mg by mouth 2 (two) times daily.     Historical Provider, MD  loperamide (IMODIUM A-D) 2 MG tablet Take 4 mg by mouth 4 (four) times daily as needed for diarrhea or loose stools.    Historical Provider, MD  magnesium hydroxide (MILK OF MAGNESIA) 400 MG/5ML suspension Take 30 mLs by mouth daily as needed for mild constipation.    Historical Provider, MD  Menthol-Zinc Oxide (CALMOSEPTINE) 0.44-20.6 % OINT Apply 1 application topically daily.    Historical Provider, MD  methadone (DOLOPHINE) 10 MG tablet Take 1 tablet (10 mg total) by mouth 2 (two) times daily. Patient not taking: Reported on 12/29/2014 10/21/13   Dorothea Ogle, MD  methylcellulose (ARTIFICIAL TEARS) 1 % ophthalmic solution Place 1 drop into both eyes daily as needed (dry eyes).     Historical Provider, MD  morphine (KADIAN) 30 MG 24 hr capsule Take 30 mg by mouth 2 (two) times daily.    Historical Provider, MD  nystatin (MYCOSTATIN/NYSTOP) 100000 UNIT/GM POWD Apply 1 Bottle topically 2 (two) times daily as needed (to moist areas under breast or rash).    Historical Provider, MD  nystatin cream (MYCOSTATIN) Apply 1 application topically 2 (two) times daily.    Historical Provider, MD  omeprazole (PRILOSEC) 20 MG capsule Take 20 mg by mouth daily.      Historical Provider, MD  predniSONE (DELTASONE) 10 MG tablet Take 50 mg tablet  and taper down by 10 mg daily until completed Patient not taking: Reported on 12/29/2014 10/21/13   Dorothea Ogle, MD  promethazine (PHENERGAN) 25 MG tablet Take 25 mg by mouth every 6 (six) hours as needed for nausea or vomiting.    Historical Provider, MD  saccharomyces boulardii (FLORASTOR) 250 MG capsule Take 1 capsule (250 mg total) by mouth 2 (two) times daily. 12/31/14   Marinda Elk, MD  sodium chloride (OCEAN) 0.65 % nasal spray Place 1 spray into the nose as needed for congestion. May keep at bedside    Historical  Provider, MD  sulfamethoxazole-trimethoprim (BACTRIM DS) 800-160 MG per tablet Take 1 tablet by mouth 2 (two) times daily. Patient not taking: Reported on 12/29/2014 10/21/13   Dorothea OgleIskra M Myers, MD  vancomycin (VANCOCIN) 125 MG capsule Take 125 mg by mouth 4 (four) times daily. For 14 days.    Historical Provider, MD   BP 95/41 mmHg  Pulse 135  Resp 28  SpO2 91% Physical Exam  Constitutional: She is oriented to person, place, and time.  Morbidly obese  HENT:  Head: Normocephalic and atraumatic.  Mouth/Throat: Mucous membranes are dry.  Eyes: EOM are normal. Pupils are equal, round, and reactive to light.  Neck: Normal range of motion. Neck supple.  Cardiovascular: Regular rhythm and normal heart sounds.  Tachycardia present.  Exam reveals no gallop and no friction rub.   No murmur heard. Pulmonary/Chest: Effort normal and breath sounds normal. She has no wheezes.  Abdominal: Soft. There is no tenderness.  Musculoskeletal: Normal range of motion.  Neurological: She is alert and oriented to person, place, and time.  Skin: Skin is warm and dry.  Chronic psoriatic rash to the bilateral LEs  Psychiatric: She has a normal mood and affect. Her behavior is normal.  Nursing note and vitals reviewed.   ED Course  Procedures (including critical care time) DIAGNOSTIC STUDIES: Oxygen Saturation is 85% on RA, normal by my interpretation.    COORDINATION OF CARE: 12:08  AM-Discussed treatment plan which includes labs and x-ray with pt at bedside and pt agreed to plan.   1:01 AM- [facility]  Labs Review Labs Reviewed  COMPREHENSIVE METABOLIC PANEL - Abnormal; Notable for the following:    Glucose, Bld 165 (*)    Creatinine, Ser 1.21 (*)    Albumin 2.9 (*)    AST 42 (*)    GFR calc non Af Amer 45 (*)    GFR calc Af Amer 52 (*)    All other components within normal limits  CBC WITH DIFFERENTIAL/PLATELET - Abnormal; Notable for the following:    WBC 16.9 (*)    RBC 5.30 (*)    RDW 15.8 (*)    Platelets 136 (*)    Neutro Abs 15.7 (*)    Lymphs Abs 0.4 (*)    All other components within normal limits  I-STAT CG4 LACTIC ACID, ED - Abnormal; Notable for the following:    Lactic Acid, Venous 2.13 (*)    All other components within normal limits  CULTURE, BLOOD (ROUTINE X 2)  CULTURE, BLOOD (ROUTINE X 2)  URINE CULTURE  URINALYSIS, ROUTINE W REFLEX MICROSCOPIC (NOT AT [facility])  INFLUENZA PANEL BY PCR (TYPE A & B, H1N1)    Imaging Review Dg Chest 2 View  05/29/2015  CLINICAL DATA:  70 year old female with cough and sepsis EXAM: CHEST  2 VIEW COMPARISON:  Radiograph dated 12/29/2014 FINDINGS: Two views of the chest demonstrate bibasilar atelectatic changes. Developing pneumonia is not excluded. There is shallow inspiration. No significant pleural effusion or pneumothorax. The cardiac silhouette is within normal limits. No acute osseous pathology. IMPRESSION: Bibasal subsegmental atelectasis. Developing pneumonia not excluded. Clinical correlation recommended. Electronically Signed   By: Elgie CollardArash  Radparvar M.D.   On: 05/29/2015 00:27   I have personally reviewed and evaluated these images and lab results as part of my medical decision-making.   EKG Interpretation   Date/Time:  Monday May 29 2015 00:09:10 EDT Ventricular Rate:  134 PR Interval:  138 QRS Duration: 85 QT Interval:  267 QTC Calculation: 399 R Axis:    -11 Text Interpretation:  Ectopic atrial tachycardia, unifocal Low voltage,  precordial leads No significant change since last tracing Confirmed by  Kandis Mannan ([id number]) on 05/29/2015 12:13:22 AM      MDM   Final diagnoses:  None    Patient is a morbidly obese 71 year old female with past medical history significant for UTI, seizures, she lives in a nursing home. Patient's presenting with fever, elevated heart rate, low blood pressure. Patient has noted a cough at her nursing home. Concern for flu versus pneumonia versus UTI. Diagnostic studies ordered.  Set could sepsis order. Broad spectrum antibiotics given. 4 L of fluid ordered given patient's weight.  Patient appears alert oriented no focality on exam. Breath sounds normal, no abdominal tenderness.  Will admit for further montioring.      CRITICAL CARE Performed by: Arlana Hove Total critical care time: 60 minutes Critical care time was exclusive of separately billable procedures and treating other patients. Critical care was necessary to treat or prevent imminent or life-threatening deterioration. Critical care was time spent personally by me on the following activities: development of treatment plan with patient and/or surrogate as well as nursing, discussions with consultants, evaluation of patient's response to treatment, examination of patient, obtaining history from patient or surrogate, ordering and performing treatments and interventions, ordering and review of laboratory studies, ordering and review of radiographic studies, pulse oximetry and re-evaluation of patient's condition.  I personally performed the services described in this documentation, which was scribed in my presence. The recorded information has been reviewed and is accurate.      Rodrigus Kilker Randall An, MD 2015/06/30 [phone]

## 2015-05-29 ENCOUNTER — Inpatient Hospital Stay (HOSPITAL_COMMUNITY): Payer: Medicare Other

## 2015-05-29 DIAGNOSIS — Z794 Long term (current) use of insulin: Secondary | ICD-10-CM | POA: Diagnosis not present

## 2015-05-29 DIAGNOSIS — D6861 Antiphospholipid syndrome: Secondary | ICD-10-CM | POA: Diagnosis present

## 2015-05-29 DIAGNOSIS — Z7982 Long term (current) use of aspirin: Secondary | ICD-10-CM | POA: Diagnosis not present

## 2015-05-29 DIAGNOSIS — L409 Psoriasis, unspecified: Secondary | ICD-10-CM

## 2015-05-29 DIAGNOSIS — M3500 Sicca syndrome, unspecified: Secondary | ICD-10-CM | POA: Diagnosis present

## 2015-05-29 DIAGNOSIS — A419 Sepsis, unspecified organism: Secondary | ICD-10-CM | POA: Diagnosis not present

## 2015-05-29 DIAGNOSIS — J101 Influenza due to other identified influenza virus with other respiratory manifestations: Secondary | ICD-10-CM | POA: Diagnosis present

## 2015-05-29 DIAGNOSIS — G7000 Myasthenia gravis without (acute) exacerbation: Secondary | ICD-10-CM | POA: Diagnosis present

## 2015-05-29 DIAGNOSIS — J189 Pneumonia, unspecified organism: Secondary | ICD-10-CM

## 2015-05-29 DIAGNOSIS — G8929 Other chronic pain: Secondary | ICD-10-CM | POA: Diagnosis present

## 2015-05-29 DIAGNOSIS — N179 Acute kidney failure, unspecified: Secondary | ICD-10-CM | POA: Diagnosis not present

## 2015-05-29 DIAGNOSIS — Z6841 Body Mass Index (BMI) 40.0 and over, adult: Secondary | ICD-10-CM | POA: Diagnosis not present

## 2015-05-29 DIAGNOSIS — B961 Klebsiella pneumoniae [K. pneumoniae] as the cause of diseases classified elsewhere: Secondary | ICD-10-CM | POA: Diagnosis present

## 2015-05-29 DIAGNOSIS — I10 Essential (primary) hypertension: Secondary | ICD-10-CM | POA: Diagnosis present

## 2015-05-29 DIAGNOSIS — M549 Dorsalgia, unspecified: Secondary | ICD-10-CM | POA: Diagnosis present

## 2015-05-29 DIAGNOSIS — F418 Other specified anxiety disorders: Secondary | ICD-10-CM | POA: Diagnosis present

## 2015-05-29 DIAGNOSIS — D72829 Elevated white blood cell count, unspecified: Secondary | ICD-10-CM | POA: Diagnosis not present

## 2015-05-29 DIAGNOSIS — Z87891 Personal history of nicotine dependence: Secondary | ICD-10-CM | POA: Diagnosis not present

## 2015-05-29 DIAGNOSIS — J9601 Acute respiratory failure with hypoxia: Secondary | ICD-10-CM | POA: Diagnosis not present

## 2015-05-29 DIAGNOSIS — T501X5A Adverse effect of loop [high-ceiling] diuretics, initial encounter: Secondary | ICD-10-CM | POA: Diagnosis present

## 2015-05-29 DIAGNOSIS — Z880 Allergy status to penicillin: Secondary | ICD-10-CM | POA: Diagnosis not present

## 2015-05-29 DIAGNOSIS — Z888 Allergy status to other drugs, medicaments and biological substances status: Secondary | ICD-10-CM | POA: Diagnosis not present

## 2015-05-29 DIAGNOSIS — I959 Hypotension, unspecified: Secondary | ICD-10-CM | POA: Diagnosis present

## 2015-05-29 DIAGNOSIS — Z66 Do not resuscitate: Secondary | ICD-10-CM | POA: Diagnosis present

## 2015-05-29 DIAGNOSIS — J1008 Influenza due to other identified influenza virus with other specified pneumonia: Secondary | ICD-10-CM | POA: Diagnosis present

## 2015-05-29 DIAGNOSIS — E11649 Type 2 diabetes mellitus with hypoglycemia without coma: Secondary | ICD-10-CM | POA: Diagnosis present

## 2015-05-29 DIAGNOSIS — K219 Gastro-esophageal reflux disease without esophagitis: Secondary | ICD-10-CM | POA: Diagnosis present

## 2015-05-29 DIAGNOSIS — Z881 Allergy status to other antibiotic agents status: Secondary | ICD-10-CM | POA: Diagnosis not present

## 2015-05-29 DIAGNOSIS — E860 Dehydration: Secondary | ICD-10-CM | POA: Diagnosis present

## 2015-05-29 DIAGNOSIS — F319 Bipolar disorder, unspecified: Secondary | ICD-10-CM | POA: Diagnosis present

## 2015-05-29 DIAGNOSIS — Y95 Nosocomial condition: Secondary | ICD-10-CM | POA: Diagnosis present

## 2015-05-29 DIAGNOSIS — G40909 Epilepsy, unspecified, not intractable, without status epilepticus: Secondary | ICD-10-CM | POA: Diagnosis present

## 2015-05-29 DIAGNOSIS — J09X1 Influenza due to identified novel influenza A virus with pneumonia: Secondary | ICD-10-CM | POA: Diagnosis not present

## 2015-05-29 DIAGNOSIS — J45901 Unspecified asthma with (acute) exacerbation: Secondary | ICD-10-CM | POA: Diagnosis present

## 2015-05-29 DIAGNOSIS — N390 Urinary tract infection, site not specified: Secondary | ICD-10-CM | POA: Diagnosis not present

## 2015-05-29 DIAGNOSIS — R000 Tachycardia, unspecified: Secondary | ICD-10-CM | POA: Diagnosis present

## 2015-05-29 DIAGNOSIS — J159 Unspecified bacterial pneumonia: Secondary | ICD-10-CM | POA: Diagnosis present

## 2015-05-29 LAB — URINALYSIS, ROUTINE W REFLEX MICROSCOPIC
Bilirubin Urine: NEGATIVE
Glucose, UA: NEGATIVE mg/dL
Hgb urine dipstick: NEGATIVE
Ketones, ur: NEGATIVE mg/dL
Leukocytes, UA: NEGATIVE
Nitrite: POSITIVE — AB
Protein, ur: NEGATIVE mg/dL
Specific Gravity, Urine: 1.014 (ref 1.005–1.030)
pH: 5 (ref 5.0–8.0)

## 2015-05-29 LAB — COMPREHENSIVE METABOLIC PANEL
ALBUMIN: 2.9 g/dL — AB (ref 3.5–5.0)
ALT: 19 U/L (ref 14–54)
ANION GAP: 11 (ref 5–15)
AST: 42 U/L — ABNORMAL HIGH (ref 15–41)
Alkaline Phosphatase: 106 U/L (ref 38–126)
BUN: 12 mg/dL (ref 6–20)
CALCIUM: 9.8 mg/dL (ref 8.9–10.3)
CHLORIDE: 106 mmol/L (ref 101–111)
CO2: 22 mmol/L (ref 22–32)
Creatinine, Ser: 1.21 mg/dL — ABNORMAL HIGH (ref 0.44–1.00)
GFR calc non Af Amer: 45 mL/min — ABNORMAL LOW (ref >60)
GFR, EST AFRICAN AMERICAN: 52 mL/min — AB (ref >60)
GLUCOSE: 165 mg/dL — AB (ref 65–99)
POTASSIUM: 4.9 mmol/L (ref 3.5–5.1)
SODIUM: 139 mmol/L (ref 135–145)
TOTAL PROTEIN: 6.7 g/dL (ref 6.5–8.1)
Total Bilirubin: 1.1 mg/dL (ref 0.3–1.2)

## 2015-05-29 LAB — URINE MICROSCOPIC-ADD ON

## 2015-05-29 LAB — CBG MONITORING, ED: GLUCOSE-CAPILLARY: 169 mg/dL — AB (ref 65–99)

## 2015-05-29 LAB — GLUCOSE, CAPILLARY
GLUCOSE-CAPILLARY: 161 mg/dL — AB (ref 65–99)
Glucose-Capillary: 152 mg/dL — ABNORMAL HIGH (ref 65–99)
Glucose-Capillary: 120 mg/dL — ABNORMAL HIGH (ref 65–99)

## 2015-05-29 LAB — APTT: APTT: 38 s — AB (ref 24–37)

## 2015-05-29 LAB — LACTIC ACID, PLASMA
LACTIC ACID, VENOUS: 0.8 mmol/L (ref 0.5–2.0)
Lactic Acid, Venous: 2 mmol/L (ref 0.5–2.0)

## 2015-05-29 LAB — PROCALCITONIN: Procalcitonin: 8.22 ng/mL

## 2015-05-29 LAB — I-STAT CG4 LACTIC ACID, ED: LACTIC ACID, VENOUS: 2.36 mmol/L — AB (ref 0.5–2.0)

## 2015-05-29 LAB — PROTIME-INR
INR: 1.23 (ref 0.00–1.49)
PROTHROMBIN TIME: 15.7 s — AB (ref 11.6–15.2)

## 2015-05-29 LAB — INFLUENZA PANEL BY PCR (TYPE A & B)
H1N1FLUPCR: NOT DETECTED
INFLBPCR: NEGATIVE
Influenza A By PCR: POSITIVE — AB

## 2015-05-29 LAB — STREP PNEUMONIAE URINARY ANTIGEN: Strep Pneumo Urinary Antigen: NEGATIVE

## 2015-05-29 LAB — CREATININE, URINE, RANDOM: Creatinine, Urine: 81.56 mg/dL

## 2015-05-29 LAB — MRSA PCR SCREENING: MRSA BY PCR: POSITIVE — AB

## 2015-05-29 LAB — HIV ANTIBODY (ROUTINE TESTING W REFLEX): HIV SCREEN 4TH GENERATION: NONREACTIVE

## 2015-05-29 MED ORDER — ASPIRIN 81 MG PO CHEW
81.0000 mg | CHEWABLE_TABLET | Freq: Every day | ORAL | Status: DC
  Administered 2015-05-29 – 2015-06-01 (×4): 81 mg via ORAL
  Filled 2015-05-29 (×4): qty 1

## 2015-05-29 MED ORDER — SODIUM CHLORIDE 0.9 % IV BOLUS (SEPSIS)
1000.0000 mL | Freq: Once | INTRAVENOUS | Status: AC
  Administered 2015-05-29: 1000 mL via INTRAVENOUS

## 2015-05-29 MED ORDER — IBUPROFEN 800 MG PO TABS
800.0000 mg | ORAL_TABLET | Freq: Once | ORAL | Status: AC
  Administered 2015-05-29: 800 mg via ORAL
  Filled 2015-05-29: qty 1

## 2015-05-29 MED ORDER — RISAQUAD PO CAPS
1.0000 | ORAL_CAPSULE | Freq: Two times a day (BID) | ORAL | Status: DC
  Administered 2015-05-29 – 2015-06-01 (×7): 1 via ORAL
  Filled 2015-05-29 (×7): qty 1

## 2015-05-29 MED ORDER — CALCIUM CARBONATE ANTACID 500 MG PO CHEW: 1.0000 | CHEWABLE_TABLET | Freq: Four times a day (QID) | ORAL | Status: DC | PRN

## 2015-05-29 MED ORDER — MAGNESIUM HYDROXIDE 400 MG/5ML PO SUSP
30.0000 mL | Freq: Every day | ORAL | Status: DC | PRN
  Filled 2015-05-29: qty 30

## 2015-05-29 MED ORDER — INSULIN ASPART 100 UNIT/ML ~~LOC~~ SOLN: 0.0000 [IU] | Freq: Every day | SUBCUTANEOUS | Status: DC

## 2015-05-29 MED ORDER — LEVALBUTEROL HCL 1.25 MG/0.5ML IN NEBU
1.2500 mg | INHALATION_SOLUTION | Freq: Four times a day (QID) | RESPIRATORY_TRACT | Status: DC
  Administered 2015-05-29 – 2015-05-30 (×3): 1.25 mg via RESPIRATORY_TRACT
  Filled 2015-05-29 (×5): qty 0.5

## 2015-05-29 MED ORDER — FLUOXETINE HCL 20 MG PO CAPS
20.0000 mg | ORAL_CAPSULE | Freq: Every day | ORAL | Status: DC
  Administered 2015-05-29 – 2015-06-01 (×4): 20 mg via ORAL
  Filled 2015-05-29 (×4): qty 1

## 2015-05-29 MED ORDER — POLYVINYL ALCOHOL 1.4 % OP SOLN
1.0000 [drp] | OPHTHALMIC | Status: DC | PRN
  Administered 2015-05-29: 1 [drp] via OPHTHALMIC
  Filled 2015-05-29: qty 15

## 2015-05-29 MED ORDER — VANCOMYCIN HCL 10 G IV SOLR
1500.0000 mg | INTRAVENOUS | Status: DC
  Administered 2015-05-29 – 2015-06-01 (×3): 1500 mg via INTRAVENOUS
  Filled 2015-05-29 (×3): qty 1500

## 2015-05-29 MED ORDER — PROMETHAZINE HCL 25 MG PO TABS: 25.0000 mg | ORAL_TABLET | Freq: Four times a day (QID) | ORAL | Status: DC | PRN

## 2015-05-29 MED ORDER — CARBAMIDE PEROXIDE 6.5 % OT SOLN
5.0000 [drp] | Freq: Two times a day (BID) | OTIC | Status: DC
  Filled 2015-05-29: qty 15

## 2015-05-29 MED ORDER — FLUCONAZOLE 150 MG PO TABS
150.0000 mg | ORAL_TABLET | ORAL | Status: DC
  Administered 2015-05-29: 150 mg via ORAL
  Filled 2015-05-29: qty 2

## 2015-05-29 MED ORDER — SODIUM CHLORIDE 0.9 % IV SOLN
INTRAVENOUS | Status: DC
  Administered 2015-05-29: 05:00:00 via INTRAVENOUS

## 2015-05-29 MED ORDER — NYSTATIN 100000 UNIT/GM EX POWD
1.0000 | Freq: Two times a day (BID) | CUTANEOUS | Status: DC
  Administered 2015-05-29 – 2015-05-31 (×6): 1 via TOPICAL
  Filled 2015-05-29: qty 15

## 2015-05-29 MED ORDER — PIPERACILLIN-TAZOBACTAM 3.375 G IVPB
3.3750 g | Freq: Three times a day (TID) | INTRAVENOUS | Status: DC
  Administered 2015-05-29 – 2015-06-01 (×9): 3.375 g via INTRAVENOUS
  Filled 2015-05-29 (×11): qty 50

## 2015-05-29 MED ORDER — GUAIFENESIN ER 600 MG PO TB12: 600.0000 mg | ORAL_TABLET | Freq: Two times a day (BID) | ORAL | Status: DC | PRN

## 2015-05-29 MED ORDER — VANCOMYCIN HCL 10 G IV SOLR
2500.0000 mg | Freq: Once | INTRAVENOUS | Status: AC
  Administered 2015-05-29: 2500 mg via INTRAVENOUS
  Filled 2015-05-29: qty 2500

## 2015-05-29 MED ORDER — PANTOPRAZOLE SODIUM 40 MG PO TBEC
40.0000 mg | DELAYED_RELEASE_TABLET | Freq: Every day | ORAL | Status: DC
Start: 2015-05-29 — End: 2015-06-01
  Administered 2015-05-29 – 2015-06-01 (×4): 40 mg via ORAL
  Filled 2015-05-29 (×4): qty 1

## 2015-05-29 MED ORDER — LOPERAMIDE HCL 2 MG PO TABS: 4.0000 mg | ORAL_TABLET | Freq: Four times a day (QID) | ORAL | Status: DC | PRN

## 2015-05-29 MED ORDER — OSELTAMIVIR PHOSPHATE 75 MG PO CAPS
75.0000 mg | ORAL_CAPSULE | Freq: Two times a day (BID) | ORAL | Status: DC
  Administered 2015-05-29 – 2015-06-01 (×7): 75 mg via ORAL
  Filled 2015-05-29 (×11): qty 1

## 2015-05-29 MED ORDER — DIPHENOXYLATE-ATROPINE 2.5-0.025 MG PO TABS: 2.0000 | ORAL_TABLET | Freq: Four times a day (QID) | ORAL | Status: DC | PRN

## 2015-05-29 MED ORDER — INSULIN ASPART 100 UNIT/ML ~~LOC~~ SOLN
0.0000 [IU] | Freq: Three times a day (TID) | SUBCUTANEOUS | Status: DC
Start: 2015-05-29 — End: 2015-06-01
  Administered 2015-05-29 – 2015-05-30 (×4): 2 [IU] via SUBCUTANEOUS
  Administered 2015-06-01: 1 [IU] via SUBCUTANEOUS

## 2015-05-29 MED ORDER — GABAPENTIN 300 MG PO CAPS
600.0000 mg | ORAL_CAPSULE | Freq: Three times a day (TID) | ORAL | Status: DC
  Administered 2015-05-29 – 2015-06-01 (×10): 600 mg via ORAL
  Filled 2015-05-29 (×10): qty 2

## 2015-05-29 MED ORDER — IPRATROPIUM BROMIDE 0.02 % IN SOLN
0.5000 mg | RESPIRATORY_TRACT | Status: DC
  Administered 2015-05-29 – 2015-05-30 (×4): 0.5 mg via RESPIRATORY_TRACT
  Filled 2015-05-29 (×5): qty 2.5

## 2015-05-29 MED ORDER — MUPIROCIN 2 % EX OINT
1.0000 "application " | TOPICAL_OINTMENT | Freq: Every day | CUTANEOUS | Status: DC
  Administered 2015-05-29 – 2015-05-31 (×3): 1 via TOPICAL
  Filled 2015-05-29 (×2): qty 22

## 2015-05-29 MED ORDER — PIPERACILLIN-TAZOBACTAM 3.375 G IVPB 30 MIN
3.3750 g | Freq: Once | INTRAVENOUS | Status: AC
  Administered 2015-05-29: 3.375 g via INTRAVENOUS
  Filled 2015-05-29: qty 50

## 2015-05-29 MED ORDER — CHOLECALCIFEROL 10 MCG (400 UNIT) PO TABS
800.0000 [IU] | ORAL_TABLET | Freq: Every day | ORAL | Status: DC
  Administered 2015-05-29 – 2015-06-01 (×4): 800 [IU] via ORAL
  Filled 2015-05-29 (×7): qty 2

## 2015-05-29 MED ORDER — HYDROMORPHONE HCL 2 MG PO TABS
6.0000 mg | ORAL_TABLET | ORAL | Status: DC | PRN
  Administered 2015-05-29 – 2015-05-31 (×4): 6 mg via ORAL
  Filled 2015-05-29 (×4): qty 3

## 2015-05-29 MED ORDER — DIPHENHYDRAMINE HCL 25 MG PO CAPS
25.0000 mg | ORAL_CAPSULE | Freq: Two times a day (BID) | ORAL | Status: DC
  Administered 2015-05-29 – 2015-05-31 (×4): 25 mg via ORAL
  Filled 2015-05-29 (×6): qty 1

## 2015-05-29 MED ORDER — BUPROPION HCL ER (SR) 100 MG PO TB12
100.0000 mg | ORAL_TABLET | Freq: Every day | ORAL | Status: DC
  Administered 2015-05-29 – 2015-06-01 (×4): 100 mg via ORAL
  Filled 2015-05-29 (×5): qty 1

## 2015-05-29 MED ORDER — CALCIPOTRIENE 0.005 % EX CREA: TOPICAL_CREAM | Freq: Two times a day (BID) | CUTANEOUS | Status: DC

## 2015-05-29 MED ORDER — ENOXAPARIN SODIUM 60 MG/0.6ML ~~LOC~~ SOLN
60.0000 mg | SUBCUTANEOUS | Status: DC
  Administered 2015-05-29: 60 mg via SUBCUTANEOUS
  Filled 2015-05-29: qty 0.6

## 2015-05-29 MED ORDER — MORPHINE SULFATE ER 15 MG PO TBCR
30.0000 mg | EXTENDED_RELEASE_TABLET | Freq: Two times a day (BID) | ORAL | Status: DC
  Administered 2015-05-29 – 2015-06-01 (×7): 30 mg via ORAL
  Filled 2015-05-29 (×7): qty 2

## 2015-05-29 NOTE — Progress Notes (Addendum)
Patient ID: CHANA LINDSTROM, female   DOB: 11-27-1945, 70 y.o.   MRN: [id number]  Patient admitted after midnight. For details, please refer to admission note completed 05/29/2015.  70 y.o. female with past medical history of diabetes on insulin, asthma, depression, psoriasis, Sjogren's disease and antiphospholipid syndrome, myasthenia gravis, bipolar disorder and seizure disorder who presented to [facility] with fever, cough, shortness of breath and chest pain. She started experiencing symptoms 1 day prior to this admission and her temp was 104. 75F. Patient is from nursing home. Her oxygen saturation dropped in 80s in a nursing home. She has been taking Tamiflu for past 2 days if skilled nursing facility.  In ED, patient was found to have WBC 16.9, temperature 102.9 degrees Fahrenheit. She was tachycardic, tachypneic and hypotensive. Her lactic acid was 2.13, pro calcitonin 8.22. Influenza panel is positive for influenza A. Chest x-ray showed bibasal or atelectasis and developing pneumonia possibility. She is still on Tamiflu and she was also started on broad-spectrum antibiotics for bacterial pneumonia.  Assessment and plan:  Acute respiratory failure with hypoxia in the setting of influenza pneumonia / healthcare associated pneumonia / bibasilar pneumonia - Hypoxia likely secondary to influenza pneumonia and probably bacterial pneumonia - Her respiratory status is stable at this time - Continue Tamiflu for influenza A - Continue vancomycin and Zosyn for HCAP - Continue DuoNeb for shortness of breath and/or wheezing  Sepsis secondary to influenza pneumonia and healthcare associated pneumonia - Sepsis criteria met on the admission with fever, tachycardia, tachypnea, hypotension in addition to lactic acidosis and leukocytosis and elevated pro calcitonin level - Source of infection is viral and bacterial pneumonia - Streptococcus pneumonia is negative - Respiratory virus panel and  Legionella is pending - Blood cultures are pending - Continue Tamiflu, vancomycin and Erline Hau [facility] [phone]

## 2015-05-29 NOTE — Progress Notes (Signed)
PT Cancellation Note  Patient Details Name: [name] MRN: [id number] DOB: [birth year]   Cancelled Treatment:    Reason Eval Not Completed: PT screened, no needs identified, will sign off  Patient reports "my feet have not been on the floor in 7 years." Reports she worked with PT 6 years ago and has no needs now. She is a long-term resident at SNF (not skilled).  **NOTE-pt reports she has large ulcers on either hip and uses an air mattress at the SNF. Recommend air mattress while hospitalized.    Dianah Pruett 05/29/2015, 3:56 PM 05/29/2015 Pager [phone]

## 2015-05-29 NOTE — H&P (Signed)
[facility] History and Physical  [name] WUJ:[id number] DOB: 21-Sep-1945 DOA: 05/28/2015  Referring physician: ED physician PCP: Pearson Grippe, MD  Specialists:   Chief Complaint: Fever, cough, shortness of breath and chest pain  HPI: [name] is a 70 y.o. female with PMH of diabetes mellitus, asthma, GERD, depression, anxiety, psoriasis, obesity, Sjogren's disease, antiphospholipid syndrome, MG, seizure, bipolar disorder, who presents with fever, cough, shortness of breath and chest pain.  Patient reports that she started having fever, cough and shortness of breath since yesterday. She coughs up dark colored sputum. She had a temperature 104.8 per report. Patient O2 desaturated to 80% in nursing home. Patient also has chest pain which is induced by coughing per patient. She does not have nausea, vomiting, diarrhea, abdominal pain and unilateral weakness. Denies symptoms of UTI. Per report, she had hypoglycemic episode with blood sugar down to 40 which was treated with D50. pt reports that she has been taking Tamiflu in the past 2 days in SNF  In ED, patient was found to have WBC 16.9, temperature 102.9, tachycardia, tachypnea, blood pressure soft, lactate 2.13, urinalysis negative for leukocytes, but positive for nitrite, acute renal injury with creatinine 1.21. Flu PCR is positive for flu A. Chest x-ray showed possible developing pneumonia in bilateral bases.  EKG: Independently reviewed. QTC 399, tachycardia, low voltage precordially, T-wave inversion in V1-V4.  Where does patient live? SNF  Can patient participate in ADLs?  None  Review of Systems:   General: has fevers, chills, no changes in body weight, has poor appetite, has fatigue HEENT: no blurry vision, hearing changes or sore throat Pulm: has dyspnea, coughing, wheezing CV: has chest pain, no palpitations Abd: no nausea, vomiting, abdominal pain, diarrhea, constipation GU: no dysuria, burning on urination,  increased urinary frequency, hematuria  Ext: no leg edema Neuro: no unilateral weakness, numbness, or tingling, no vision change or hearing loss Skin: no rash MSK: No muscle spasm, no deformity, no limitation of range of movement in spin Heme: No easy bruising.  Travel history: No recent long distant travel.  Allergy:  Allergies  Allergen Reactions  . Other Other (See Comments)    Muscle Relaxants on MAR   . Penicillins Other (See Comments)    Reaction=unknown Has tolerated amoxicillin Patient not able to answer questions regarding reaction  . Tetracyclines & Related Other (See Comments)    On MAR     Past Medical History  Diagnosis Date  . MRSA cellulitis   . Psoriasis   . Obesities, morbid (HCC)   . Sjogren's disease (HCC)   . UTI (urinary tract infection)   . Antiphospholipid syndrome (HCC)   . Diverticulosis   . Anxiety   . Depression   . Asthma   . Ventral hernia   . Diabetes mellitus   . Myasthenia gravis   . Myasthenia gravis   . Myasthenia gravis   . Back pain, chronic     missing some vertebra per pt  . Complication of anesthesia     can't have muscle relaxers -my. gravis  . Bipolar disorder, unspecified (HCC)   . Seizures [facility])     Past Surgical History  Procedure Laterality Date  . Abdominal hysterectomy    . Pilonidal cyst excision    . Cholecystectomy    . Appendectomy      Social History:  reports that she has quit smoking. She has never used smokeless tobacco. She reports that she does not drink alcohol or use illicit drugs.  Family History:  Family History  Problem Relation Age of Onset  . Adopted: Yes     Prior to Admission medications   Medication Sig Start Date End Date Taking? Authorizing Provider  acidophilus (RISAQUAD) CAPS Take 1 capsule by mouth 2 (two) times daily.     Yes Historical Provider, MD  aspirin 81 MG chewable tablet Chew 81 mg by mouth daily.   Yes Historical Provider, MD  buPROPion (WELLBUTRIN SR) 100 MG 12 hr  tablet Take 100 mg by mouth daily.   Yes Historical Provider, MD  calcipotriene (DOVONOX) 0.005 % cream Apply topically 2 (two) times daily.   Yes Historical Provider, MD  calcium carbonate (TUMS - DOSED IN MG ELEMENTAL CALCIUM) 500 MG chewable tablet Chew 1 tablet by mouth every 6 (six) hours as needed for indigestion or heartburn.   Yes Historical Provider, MD  carbamide peroxide (DEBROX) 6.5 % otic solution Place 5 drops into both ears 2 (two) times daily.   Yes Historical Provider, MD  cholecalciferol (VITAMIN D) 400 UNITS TABS Take 800 Units by mouth daily.     Yes Historical Provider, MD  diphenhydrAMINE (BENADRYL) 25 MG tablet Take 25 mg by mouth every 12 (twelve) hours.   Yes Historical Provider, MD  diphenoxylate-atropine (LOMOTIL) 2.5-0.025 MG tablet Take 2 tablets by mouth 4 (four) times daily as needed for diarrhea or loose stools.   Yes Historical Provider, MD  fluconazole (DIFLUCAN) 150 MG tablet Take 150 mg by mouth once a week. On Thursday   Yes Historical Provider, MD  FLUoxetine (PROZAC) 20 MG capsule Take 20 mg by mouth daily.   Yes Historical Provider, MD  Fluticasone-Salmeterol (ADVAIR) 250-50 MCG/DOSE AEPB Inhale 1 puff into the lungs 2 (two) times daily.   Yes Historical Provider, MD  furosemide (LASIX) 20 MG tablet Take 20 mg by mouth 2 (two) times daily.     Yes Historical Provider, MD  gabapentin (NEURONTIN) 600 MG tablet Take 600 mg by mouth 3 (three) times daily.   Yes Historical Provider, MD  glipiZIDE (GLUCOTROL) 5 MG tablet Take 5 mg by mouth daily before breakfast.   Yes Historical Provider, MD  guaiFENesin (MUCINEX) 600 MG 12 hr tablet Take 600 mg by mouth 2 (two) times daily as needed for cough.   Yes Historical Provider, MD  HYDROmorphone (DILAUDID) 2 MG tablet Take 1 tablet (2 mg total) by mouth every 6 (six) hours as needed. For pain Patient taking differently: Take 6 mg by mouth every 4 (four) hours as needed for moderate pain. For pain 10/21/13  Yes Dorothea Ogle,  MD  insulin aspart (NOVOLOG) 100 UNIT/ML injection Inject 0-10 Units into the skin 3 (three) times daily before meals. 0-149 0 units  150-200 2 units 201 -250 4 units 251-300 6 units  301-- 350 8 units 351-400 10 units  401 + call MD   Yes Historical Provider, MD  insulin glargine (LANTUS) 100 UNIT/ML injection Inject 45 Units into the skin at bedtime.   Yes Historical Provider, MD  loperamide (IMODIUM A-D) 2 MG tablet Take 4 mg by mouth 4 (four) times daily as needed for diarrhea or loose stools.   Yes Historical Provider, MD  magnesium hydroxide (MILK OF MAGNESIA) 400 MG/5ML suspension Take 30 mLs by mouth daily as needed for mild constipation.   Yes Historical Provider, MD  morphine (MS CONTIN) 30 MG 12 hr tablet Take 30 mg by mouth every 12 (twelve) hours.   Yes Historical Provider, MD  mupirocin ointment Idelle Jo)  2 % Place 1 application into the nose daily. To right posterior thigh and wrap in karlex   Yes Historical Provider, MD  nystatin (MYCOSTATIN/NYSTOP) 100000 UNIT/GM POWD Apply 1 Bottle topically 2 (two) times daily.    Yes Historical Provider, MD  omeprazole (PRILOSEC) 20 MG capsule Take 20 mg by mouth daily.     Yes Historical Provider, MD  oseltamivir (TAMIFLU) 75 MG capsule Take 75 mg by mouth 2 (two) times daily.   Yes Historical Provider, MD  polyvinyl alcohol (LIQUIFILM TEARS) 1.4 % ophthalmic solution Place 1 drop into both eyes as needed for dry eyes.   Yes Historical Provider, MD  promethazine (PHENERGAN) 25 MG tablet Take 25 mg by mouth every 6 (six) hours as needed for nausea or vomiting.   Yes Historical Provider, MD  sodium chloride (OCEAN) 0.65 % nasal spray Place 1 spray into the nose as needed for congestion. May keep at bedside   Yes Historical Provider, MD  amoxicillin-clavulanate (AUGMENTIN) 875-125 MG tablet Take 1 tablet by mouth 2 (two) times daily. Patient not taking: Reported on 05/29/2015 12/31/14   Marinda Elk, MD  budesonide (PULMICORT) 0.25  MG/2ML nebulizer solution Take 2 mLs (0.25 mg total) by nebulization 2 (two) times daily. Patient not taking: Reported on 12/29/2014 10/21/13   Dorothea Ogle, MD  doxycycline (VIBRA-TABS) 100 MG tablet Take 1 tablet (100 mg total) by mouth 2 (two) times daily. Patient not taking: Reported on 05/29/2015 12/31/14   Marinda Elk, MD  methadone (DOLOPHINE) 10 MG tablet Take 1 tablet (10 mg total) by mouth 2 (two) times daily. Patient not taking: Reported on 12/29/2014 10/21/13   Dorothea Ogle, MD  predniSONE (DELTASONE) 10 MG tablet Take 50 mg tablet and taper down by 10 mg daily until completed Patient not taking: Reported on 12/29/2014 10/21/13   Dorothea Ogle, MD  saccharomyces boulardii (FLORASTOR) 250 MG capsule Take 1 capsule (250 mg total) by mouth 2 (two) times daily. Patient not taking: Reported on 05/29/2015 12/31/14   Marinda Elk, MD  sulfamethoxazole-trimethoprim (BACTRIM DS) 800-160 MG per tablet Take 1 tablet by mouth 2 (two) times daily. Patient not taking: Reported on 12/29/2014 10/21/13   Dorothea Ogle, MD    Physical Exam: Filed Vitals:   05/29/15 0600 05/29/15 0630 05/29/15 0645 05/29/15 0700  BP: 103/60 94/51 96/45  104/54  Pulse: 126 91 87 86  Temp:      TempSrc:      Resp: Weight:      SpO2: 94% 90% 95% 95%   General: Not in acute distress HEENT:       Eyes: PERRL, EOMI, no scleral icterus.       ENT: No discharge from the ears and nose, no pharynx injection, no tonsillar enlargement.        Neck: No JVD, no bruit, no mass felt. Heme: No neck lymph node enlargement. Cardiac: S1/S2, RRR, tachycardia. No murmurs, No gallops or rubs. Pulm: has mild wheezing bilaterally. No rales or rubs. Abd: Soft, nondistended, nontender, no rebound pain, no organomegaly, BS present. Ext: No pitting leg edema bilaterally. 2+DP/PT pulse bilaterally. Musculoskeletal: No joint deformities, No joint redness or warmth, no limitation of ROM in spin. Skin: No rashes.   Neuro: Alert, oriented X3, cranial nerves II-XII grossly intact, moves all extremities. Psych: Patient is not psychotic, no suicidal or hemocidal ideation.  Labs on Admission:  Basic Metabolic Panel:  Recent Labs Lab 05/28/15 2320  NA  139  K 4.9  CL 106  CO2 22  GLUCOSE 165*  BUN 12  CREATININE 1.21*  CALCIUM 9.8   Liver Function Tests:  Recent Labs Lab 05/28/15 2320  AST 42*  ALT 19  ALKPHOS 106  BILITOT 1.1  PROT 6.7  ALBUMIN 2.9*   No results for input(s): LIPASE, AMYLASE in the last 168 hours. No results for input(s): AMMONIA in the last 168 hours. CBC:  Recent Labs Lab 05/28/15 2320  WBC 16.9*  NEUTROABS 15.7*  HGB 14.4  HCT 45.8  MCV 86.4  PLT 136*   Cardiac Enzymes: No results for input(s): CKTOTAL, CKMB, CKMBINDEX, TROPONINI in the last 168 hours.  BNP (last 3 results) No results for input(s): BNP in the last 8760 hours.  ProBNP (last 3 results) No results for input(s): PROBNP in the last 8760 hours.  CBG: No results for input(s): GLUCAP in the last 168 hours.  Radiological Exams on Admission: Dg Chest 2 View  05/29/2015  CLINICAL DATA:  70 year old female with cough and sepsis EXAM: CHEST  2 VIEW COMPARISON:  Radiograph dated 12/29/2014 FINDINGS: Two views of the chest demonstrate bibasilar atelectatic changes. Developing pneumonia is not excluded. There is shallow inspiration. No significant pleural effusion or pneumothorax. The cardiac silhouette is within normal limits. No acute osseous pathology. IMPRESSION: Bibasal subsegmental atelectasis. Developing pneumonia not excluded. Clinical correlation recommended. Electronically Signed   By: Elgie CollardArash  Radparvar M.D.   On: 05/29/2015 00:27    Assessment/Plan Principal Problem:   HCAP (healthcare-associated pneumonia) Active Problems:   Psoriasis (a type of skin inflammation)   Myasthenia gravis (HCC)   Sepsis (HCC)   Acute respiratory failure (HCC)   Diabetes mellitus type 2, controlled  (HCC)   AKI (acute kidney injury) (HCC)   Influenza A  Acute respiratory failure with hypoxia due to Flu A and possible HCAP, and sepsis: Patient's productive cough, shortness of breath, fever and chest pain plus chest x-ray findings are consistent with HCAP. Patient may also have mild asthma exacerbation given mild wheezing on auscultation. She is septic on admission with leukocytosis, fever, tachycardia, elevated lactate. Blood pressure is soft, but hemodynamically stable.  -will admit to SDU - IV Vancomycin and Zosyn - continue Tamiflu - Mucinex for cough  - Atrovent and Xopenex Neb prn for SOB - Urine legionella and S. pneumococcal antigen - Follow up blood culture x2, sputum culture and respiratory virus panel, plus Flu pcr - will get Procalcitonin and trend lactic acid level per sepsis protocol - IVF: 4L of NS bolus in ED, followed by 125 mL per hour of NS   Mild asthma exacerbation: -Breathing treatment as above  DM-II: Last A1c 5.7 on 10/16/13, well controled. Patient is taking NovoLog, glipizide and Lantus at home. Per report, she had one episode of hypoglycemia. Her blood sugar is 165 on admission. -We hold Lantus -SSI  AKI: Likely due to prerenal secondary to dehydration and continuation of diruetics - IVF as above - Check FeUrea - Follow up renal function by BMP - Hold Lasix (unclear why pt is lasix)   DVT ppx: SQ Lovenox  Code Status: DNR Family Communication: None at bed side.  Disposition Plan: Admit to inpatient   Date of Service 05/29/2015    Lorretta HarpIU, Bilbo Carcamo [facility] Pager [phone]  If 7PM-7AM, please contact night-coverage [email or web address] Password [email or web address] 05/29/2015, 7:04 AM

## 2015-05-29 NOTE — ED Notes (Signed)
Per [facility]  Pt coming form [facility] with long term care. Pt has had increased temperature and difficulty breathing. Pt temperature reported to be 104.8 axillary prior to EMS arrival. EMS temp 101.7 axillary. Pt hot to touch. Facility gave 1000 mg tylenol (2100) and put ice packs in the pts groin and axillary. EMS reports pt 02 stats on RA in the mid 80s. Pt has a hx of COP. Respirations have been shallow. Tachypnea. EMS reports that facilty gave nebs but unaware what kind or how much. Faciulty also reports pt 55-85% on RA. Facility also reports that pt CBG earlier in the day was 40 and D50 was given, no after CBG was verbalized.  Facility to bring DNR to bedside.  133/90 BP 26 R 140s HR  Pt alert to self

## 2015-05-29 NOTE — Progress Notes (Signed)
Pharmacy Antibiotic Note  [name] is a 70 [facility]. [facility].  Pharmacy has been consulted for Vancomycin and Zosyn dosing. Estimated norm CrCl 50 ml/min.  Vanc 2.5gm and Zosyn 3.375gm given in ED ~0100  Plan: Zosyn 3.375gm IV q8h - doses over 4 hours Vancomycin 1500mg  IV q24h Will f/u micro data, renal function, and pt's clinical condition Vanc trough prn  Weight: 300 lb (136.079 kg)  Temp (24hrs), Avg:102.9 F (39.4 C), Min:102.9 F (39.4 C), Max:102.9 F (39.4 C)   Recent Labs Lab 05/28/15 2320 05/28/15 2330 05/29/15 0238  WBC 16.9*  --   --   CREATININE 1.21*  --   --   LATICACIDVEN  --  2.13* 2.36*    Estimated Creatinine Clearance: 64.8 mL/min (by C-G formula based on Cr of 1.21).    Allergies  Allergen Reactions  . Other Other (See Comments)    Muscle Relaxants on MAR   . Penicillins Other (See Comments)    Reaction=unknown Has tolerated amoxicillin Patient not able to answer questions regarding reaction  . Tetracyclines & Related Other (See Comments)    On MAR     Antimicrobials this admission: 3/27 Zosyn >>  3/27 Vanc >>   Dose adjustments this admission: n/a  Microbiology results: 3/27 BCx:  3/27 UCx:    Sputum:    RSV:   Thank you for allowing pharmacy to be a part of this patient's care.  [name], PharmD, BCPS Clinical pharmacist, pager [phone] 05/29/2015 4:13 AM

## 2015-05-30 ENCOUNTER — Inpatient Hospital Stay (HOSPITAL_COMMUNITY): Payer: Medicare Other

## 2015-05-30 DIAGNOSIS — Z794 Long term (current) use of insulin: Secondary | ICD-10-CM

## 2015-05-30 DIAGNOSIS — E119 Type 2 diabetes mellitus without complications: Secondary | ICD-10-CM

## 2015-05-30 DIAGNOSIS — D72829 Elevated white blood cell count, unspecified: Secondary | ICD-10-CM

## 2015-05-30 DIAGNOSIS — J09X1 Influenza due to identified novel influenza A virus with pneumonia: Secondary | ICD-10-CM | POA: Diagnosis present

## 2015-05-30 DIAGNOSIS — J9601 Acute respiratory failure with hypoxia: Secondary | ICD-10-CM | POA: Diagnosis present

## 2015-05-30 LAB — GLUCOSE, CAPILLARY
Glucose-Capillary: 93 mg/dL (ref 65–99)
Glucose-Capillary: 151 mg/dL — ABNORMAL HIGH (ref 65–99)
Glucose-Capillary: 185 mg/dL — ABNORMAL HIGH (ref 65–99)

## 2015-05-30 LAB — BASIC METABOLIC PANEL
Anion gap: 8 (ref 5–15)
BUN: 12 mg/dL (ref 6–20)
CALCIUM: 9.8 mg/dL (ref 8.9–10.3)
CO2: 22 mmol/L (ref 22–32)
CREATININE: 0.78 mg/dL (ref 0.44–1.00)
Chloride: 111 mmol/L (ref 101–111)
GFR calc non Af Amer: >60 mL/min (ref >60)
Glucose, Bld: 167 mg/dL — ABNORMAL HIGH (ref 65–99)
Potassium: 4.2 mmol/L (ref 3.5–5.1)
Sodium: 141 mmol/L (ref 135–145)

## 2015-05-30 LAB — CBC
HCT: 40.3 % (ref 36.0–46.0)
Hemoglobin: 12 g/dL (ref 12.0–15.0)
MCH: 26 pg (ref 26.0–34.0)
MCHC: 29.8 g/dL — ABNORMAL LOW (ref 30.0–36.0)
MCV: 87.4 fL (ref 78.0–100.0)
Platelets: 141 10*3/uL — ABNORMAL LOW (ref 150–400)
RBC: 4.61 MIL/uL (ref 3.87–5.11)
RDW: 16 % — AB (ref 11.5–15.5)
WBC: 7 10*3/uL (ref 4.0–10.5)

## 2015-05-30 LAB — UREA NITROGEN, URINE: UREA NITROGEN UR: 270 mg/dL

## 2015-05-30 MED ORDER — METOPROLOL TARTRATE 1 MG/ML IV SOLN
2.5000 mg | Freq: Three times a day (TID) | INTRAVENOUS | Status: DC
  Administered 2015-05-30 – 2015-06-01 (×5): 2.5 mg via INTRAVENOUS
  Filled 2015-05-30 (×5): qty 5

## 2015-05-30 MED ORDER — IPRATROPIUM BROMIDE 0.02 % IN SOLN
0.5000 mg | Freq: Three times a day (TID) | RESPIRATORY_TRACT | Status: DC
  Administered 2015-05-30 – 2015-06-01 (×5): 0.5 mg via RESPIRATORY_TRACT
  Filled 2015-05-30 (×7): qty 2.5

## 2015-05-30 MED ORDER — FUROSEMIDE 10 MG/ML IJ SOLN
40.0000 mg | Freq: Once | INTRAMUSCULAR | Status: AC
  Administered 2015-05-30: 40 mg via INTRAVENOUS
  Filled 2015-05-30: qty 4

## 2015-05-30 MED ORDER — LEVALBUTEROL HCL 1.25 MG/0.5ML IN NEBU
1.2500 mg | INHALATION_SOLUTION | Freq: Three times a day (TID) | RESPIRATORY_TRACT | Status: DC
  Administered 2015-05-30 – 2015-06-01 (×5): 1.25 mg via RESPIRATORY_TRACT
  Filled 2015-05-30 (×7): qty 0.5

## 2015-05-30 NOTE — Progress Notes (Signed)
OT Cancellation Note  Patient Details Name: [name] Fredericksonleanor H [name] MRN: [id number] DOB: 04/09/1945   Cancelled Treatment:    Reason Eval/Treat Not Completed: OT screened, no needs identified, will sign off. Pt with long standing dependence in ADL and mobility. No acute needs.  Evern BioMayberry, Jaquan Sadowsky Lynn 05/30/2015, 8:08 AM

## 2015-05-30 NOTE — Progress Notes (Addendum)
Patient ID: [name], female   DOB: December 25, 1945, 70 y.o.   MRN: [id number] [facility] PROGRESS NOTE  KINSLIE HOVE QQV:[id number] DOB: [birth year] DOA: 05/28/2015 PCP: [name], MD  Brief narrative:    70 y.o. female with past medical history of diabetes on insulin, asthma, depression, psoriasis, Sjogren's disease and antiphospholipid syndrome, myasthenia gravis, bipolar disorder and seizure disorder who presented to [facility] with fever, cough, shortness of breath and chest pain. She started experiencing symptoms 1 day prior to this admission and her temp was 104. 30F. Patient is from nursing home. Her oxygen saturation dropped in 80s in a nursing home. She has been taking Tamiflu for past 2 days if skilled nursing facility.  In ED, patient was found to have WBC 16.9, temperature 102.9 degrees Fahrenheit. She was tachycardic, tachypneic and hypotensive. Her lactic acid was 2.13, pro calcitonin 8.22. Influenza panel is positive for influenza A. Chest x-ray showed bibasal or atelectasis and developing pneumonia possibility. She is on Tamiflu and she was also started on broad-spectrum antibiotics for bacterial pneumonia.  Assessment/Plan:    Principal Problem: Acute respiratory failure with hypoxia in the setting of influenza pneumonia / healthcare associated pneumonia / bibasilar pneumonia - Hypoxia likely secondary to influenza pneumonia and probably bacterial pneumonia - She is wheezing this morning which is probably because of fluids she has received on the admission for hypotension. We will stop IV fluids today. We'll give 1 dose of Lasix IV 40 mg. She actually takes Lasix at home 20 mg 2 times daily but this was held on admission because of hypotension. - Obtain chest x-ray this morning. Chest x-ray on the admission showed bibasilar atelectasis, developing pneumonia. - She does have Xopenex and Atrovent 3 times daily scheduled - Continue treatment for influenza  pneumonia with Tamiflu - Continue treatment with Zosyn and vancomycin for healthcare associated pneumonia   Active Problems: Sepsis secondary to influenza pneumonia and healthcare associated pneumonia - Sepsis criteria met on the admission with fever, tachycardia, tachypnea, hypotension in addition to lactic acidosis and leukocytosis and elevated procalcitonin level - Source of infection is viral and bacterial pneumonia. Her influenza panel was positive for influenza A. - Streptococcus pneumonia is negative - Respiratory virus panel and Legionella is pendingAs of 05/30/2015 - Blood cultures are pendingAs of 05/30/2015 - Continue Tamiflu, vancomycin and Zosyn  Essential hypertension / sinus tachycardia - Will start low-dose metoprolol 2.5 mg IV 2 times daily - She will get one dose of Lasix 40 mg IV - Monitor on telemetry  Morbid obesity - Body mass index is 44.28 kg/(m^2).  Controlled diabetes mellitus without complications with long-term insulin use - Last A1c in 2015 which was at goal. Check A1c on this admission. - She is currently on sliding scale insulin only - CBGs in past 24 hours: 120, 93, 151   DVT Prophylaxis  - Lovenox subcutaneous ordered   Code Status: DNR/DNI Family Communication:  plan of care discussed with the patient Disposition Plan: To skilled nursing facility likely by a 06/02/2015, once sepsis etiology results  IV access:  Peripheral IV  Procedures and diagnostic studies:    Dg Chest 2 View 05/29/2015  Bibasal subsegmental atelectasis. Developing pneumonia not excluded. Clinical correlation recommended.  Medical Consultants:  None   IAnti-Infectives:   Vanco and zosyn 05/29/2015 -->    Leisa Lenz, MD  [facility] Pager [phone]  Time spent in minutes: 25 minutes  If 7PM-7AM, please contact night-coverage [email or web address] Password [facility] 05/30/2015, 12:18 PM  LOS: 1 day    HPI/Subjective: No acute overnight events. Patient reports  feeling tired.   Objective: Filed Vitals:   05/29/15 2205 05/30/15 0109 05/30/15 0540 05/30/15 0600  BP: 121/65  130/63   Pulse: 75  87   Temp: 98.3 F (36.8 C)  98.2 F (36.8 C)   TempSrc: Oral  Oral   Resp: 16  18   Height:    5' 9"  (1.753 m)  Weight:      SpO2: 95% 96% 96%     Intake/Output Summary (Last 24 hours) at 05/30/15 1218 Last data filed at 05/30/15 0830  Gross per 24 hour  Intake    360 ml  Output    200 ml  Net    160 ml    Exam:   General:  Pt is alert, not in acute distress  Cardiovascular: Regular rate and rhythm, S1/S2 appreciated   Respiratory: wheezing in upper lung lobes   Abdomen: Soft, non tender, non distended, bowel sounds present  Extremities: No cyanosis, pulses palpable bilaterally  Neuro: Grossly nonfocal  Data Reviewed: Basic Metabolic Panel:  Recent Labs Lab 05/28/15 2320  NA 139  K 4.9  CL 106  CO2 22  GLUCOSE 165*  BUN 12  CREATININE 1.21*  CALCIUM 9.8   Liver Function Tests:  Recent Labs Lab 05/28/15 2320  AST 42*  ALT 19  ALKPHOS 106  BILITOT 1.1  PROT 6.7  ALBUMIN 2.9*   No results for input(s): LIPASE, AMYLASE in the last 168 hours. No results for input(s): AMMONIA in the last 168 hours. CBC:  Recent Labs Lab 05/28/15 2320  WBC 16.9*  NEUTROABS 15.7*  HGB 14.4  HCT 45.8  MCV 86.4  PLT 136*   Cardiac Enzymes: No results for input(s): CKTOTAL, CKMB, CKMBINDEX, TROPONINI in the last 168 hours. BNP: Invalid input(s): POCBNP CBG:  Recent Labs Lab 05/29/15 1134 05/29/15 1624 05/29/15 2200 05/30/15 0744 05/30/15 1136  GLUCAP 161* 152* 120* 93 151*    Recent Results (from the past 240 hour(s))  Culture, blood (routine x 2)     Status: None (Preliminary result)   Collection Time: 05/28/15 11:20 PM  Result Value Ref Range Status   Specimen Description BLOOD RIGHT HAND  Final   Special Requests BOTTLES DRAWN AEROBIC AND ANAEROBIC 5CC  Final   Culture NO GROWTH 1 DAY  Final   Report Status  PENDING  Incomplete  Culture, blood (routine x 2)     Status: None (Preliminary result)   Collection Time: 05/28/15 11:22 PM  Result Value Ref Range Status   Specimen Description BLOOD LEFT HAND  Final   Special Requests BOTTLES DRAWN AEROBIC AND ANAEROBIC 5CC  Final   Culture NO GROWTH 1 DAY  Final   Report Status PENDING  Incomplete  MRSA PCR Screening     Status: Abnormal   Collection Time: 05/29/15 10:17 AM  Result Value Ref Range Status   MRSA by PCR POSITIVE (A) NEGATIVE Final    Comment:        The GeneXpert MRSA Assay (FDA approved for NASAL specimens only), is one component of a comprehensive MRSA colonization surveillance program. It is not intended to diagnose MRSA infection nor to guide or monitor treatment for MRSA infections. RESULT CALLED TO, READ BACK BY AND VERIFIED WITH: Ranae Pila RN 12:30 05/29/15 ([email or web address])      Scheduled Meds: . acidophilus  1 capsule Oral BID  . aspirin  81 mg Oral Daily  . buPROPion  100 mg Oral Daily  . carbamide peroxide  5 drop Both Ears BID  . cholecalciferol  800 Units Oral Daily  . diphenhydrAMINE  25 mg Oral Q12H  . enoxaparin (LOVENOX) injection  60 mg Subcutaneous Q24H  . fluconazole  150 mg Oral Weekly  . FLUoxetine  20 mg Oral Daily  . gabapentin  600 mg Oral TID  . insulin aspart  0-5 Units Subcutaneous QHS  . insulin aspart  0-9 Units Subcutaneous TID WC  . ipratropium  0.5 mg Nebulization TID  . levalbuterol  1.25 mg Nebulization TID  . metoprolol  2.5 mg Intravenous 3 times per day  . morphine  30 mg Oral Q12H  . mupirocin ointment  1 application Topical Daily  . nystatin  1 Bottle Topical BID  . oseltamivir  75 mg Oral BID  . pantoprazole  40 mg Oral Daily  . piperacillin-tazobactam (ZOSYN)  IV  3.375 g Intravenous 3 times per day  . vancomycin  1,500 mg Intravenous Q24H   Continuous Infusions: . sodium chloride 75 mL/hr at 05/29/15 1151

## 2015-05-30 NOTE — [place] FL2 (Signed)
[place] MEDICAID FL2 LEVEL OF CARE SCREENING TOOL     IDENTIFICATION  Patient Name: [name] Birthdate: 01-Feb-1946 Sex: female Admission Date (Current Location): 05/28/2015  [place] and [place] Number:  Haynes Bast [id number] Facility and Address:  [facility], [address], [place], [place] [address]      Provider Number: [id number]  Attending Physician Name and Address:  Alison Murray, MD  Relative Name and Phone Number:       Current Level of Care: Hospital Recommended Level of Care: Skilled Nursing Facility Prior Approval Number:    Date Approved/Denied:   PASRR Number:    Discharge Plan: SNF [facility] Care)    Current Diagnoses: Patient Active Problem List   Diagnosis Date Noted  . Influenza A 05/29/2015  . Cellulitis of left hip 12/31/2014  . HCAP (healthcare-associated pneumonia) 12/29/2014  . Diabetes mellitus type 2, controlled (HCC) 12/29/2014  . ARF (acute renal failure) (HCC) 12/29/2014  . AKI (acute kidney injury) (HCC) 12/29/2014  . Sepsis (HCC) 10/16/2013  . Aspiration pneumonia (HCAP) 10/16/2013  . Acute respiratory failure (HCC) 10/16/2013  . Psoriasis (a type of skin inflammation) 01/17/2011  . Cellulitis and abscess of buttock 01/17/2011  . Diabetes mellitus (HCC) 01/17/2011  . Obesity (BMI 30-39.9) 01/17/2011  . Myasthenia gravis (HCC) 01/17/2011  . Antiphospholipid syndrome (HCC) 01/17/2011    Orientation RESPIRATION BLADDER Height & Weight     Self, Time, Situation, Place  O2 (2L) Incontinent Weight: 300 lb (136.079 kg) Height:   (175.3 cm)  BEHAVIORAL SYMPTOMS/MOOD NEUROLOGICAL BOWEL NUTRITION STATUS   (n/a)  (n/a) Incontinent Diet (Carb Modified)  AMBULATORY STATUS COMMUNICATION OF NEEDS Skin   Extensive Assist Verbally Normal                       Personal Care Assistance Level of Assistance  Bathing, Feeding, Dressing Bathing Assistance: Maximum assistance Feeding assistance:  Independent Dressing Assistance: Maximum assistance     Functional Limitations Info  Sight, Hearing, Speech Sight Info: Adequate Hearing Info: Adequate Speech Info: Adequate    SPECIAL CARE FACTORS FREQUENCY  PT (By licensed PT), OT (By licensed OT)     PT Frequency: Patient Bedbound at SNF OT Frequency: Patient Bedbound at SNF            Contractures Contractures Info: Not present    Additional Factors Info  Code Status, Allergies, Insulin Sliding Scale, Isolation Precautions Code Status Info: DNR Allergies Info: Other, Penicillins, Tetracyclines & Related   Insulin Sliding Scale Info: 0-5 units at bedtime. 0-9 units 3x/day Isolation Precautions Info: Droplet Precation. MRSA     Current Medications (05/30/2015):  This is the current hospital active medication list Current Facility-Administered Medications  Medication Dose Route Frequency Provider Last Rate Last Dose  . 0.9 %  sodium chloride infusion   Intravenous Continuous Alison Murray, MD 75 mL/hr at 05/29/15 1151    . acidophilus (RISAQUAD) capsule 1 capsule  1 capsule Oral BID Lorretta Harp, MD   1 capsule at 05/30/15 1033  . aspirin chewable tablet 81 mg  81 mg Oral Daily Lorretta Harp, MD   81 mg at 05/30/15 1032  . buPROPion (WELLBUTRIN SR) 12 hr tablet 100 mg  100 mg Oral Daily Lorretta Harp, MD   100 mg at 05/30/15 1028  . calcium carbonate (TUMS - dosed in mg elemental calcium) chewable tablet 200 mg of elemental calcium  1 tablet Oral Q6H PRN Lorretta Harp, MD      .  carbamide peroxide (DEBROX) 6.5 % otic solution 5 drop  5 drop Both Ears BID Lorretta HarpXilin Niu, MD   5 drop at 05/29/15 1002  . cholecalciferol (VITAMIN D) tablet 800 Units  800 Units Oral Daily Lorretta HarpXilin Niu, MD   800 Units at 05/30/15 1027  . diphenhydrAMINE (BENADRYL) capsule 25 mg  25 mg Oral Q12H Lorretta HarpXilin Niu, MD   25 mg at 05/29/15 1717  . diphenoxylate-atropine (LOMOTIL) 2.5-0.025 MG per tablet 2 tablet  2 tablet Oral QID PRN Lorretta HarpXilin Niu, MD      . enoxaparin (LOVENOX)  injection 60 mg  60 mg Subcutaneous Q24H Lorretta HarpXilin Niu, MD   60 mg at 05/29/15 1700  . fluconazole (DIFLUCAN) tablet 150 mg  150 mg Oral Weekly Lorretta HarpXilin Niu, MD   150 mg at 05/29/15 0437  . FLUoxetine (PROZAC) capsule 20 mg  20 mg Oral Daily Lorretta HarpXilin Niu, MD   20 mg at 05/30/15 1025  . gabapentin (NEURONTIN) capsule 600 mg  600 mg Oral TID Lorretta HarpXilin Niu, MD   600 mg at 05/30/15 1032  . guaiFENesin (MUCINEX) 12 hr tablet 600 mg  600 mg Oral BID PRN Lorretta HarpXilin Niu, MD      . HYDROmorphone (DILAUDID) tablet 6 mg  6 mg Oral Q4H PRN Lorretta HarpXilin Niu, MD   6 mg at 05/30/15 0751  . insulin aspart (novoLOG) injection 0-5 Units  0-5 Units Subcutaneous QHS Lorretta HarpXilin Niu, MD   0 Units at 05/29/15 2200  . insulin aspart (novoLOG) injection 0-9 Units  0-9 Units Subcutaneous TID WC Lorretta HarpXilin Niu, MD   2 Units at 05/29/15 1706  . ipratropium (ATROVENT) nebulizer solution 0.5 mg  0.5 mg Nebulization TID Alison MurrayAlma M Devine, MD   0.5 mg at 05/30/15 0800  . levalbuterol (XOPENEX) nebulizer solution 1.25 mg  1.25 mg Nebulization TID Alison MurrayAlma M Devine, MD   1.25 mg at 05/30/15 0800  . magnesium hydroxide (MILK OF MAGNESIA) suspension 30 mL  30 mL Oral Daily PRN Lorretta HarpXilin Niu, MD      . morphine (MS CONTIN) 12 hr tablet 30 mg  30 mg Oral Q12H Lorretta HarpXilin Niu, MD   30 mg at 05/30/15 1024  . mupirocin ointment (BACTROBAN) 2 % 1 application  1 application Topical Daily Lorretta HarpXilin Niu, MD   1 application at 05/30/15 1024  . nystatin (MYCOSTATIN/NYSTOP) topical powder 1 Bottle  1 Bottle Topical BID Lorretta HarpXilin Niu, MD   1 Bottle at 05/29/15 2220  . oseltamivir (TAMIFLU) capsule 75 mg  75 mg Oral BID Alison MurrayAlma M Devine, MD   75 mg at 05/30/15 1025  . pantoprazole (PROTONIX) EC tablet 40 mg  40 mg Oral Daily Lorretta HarpXilin Niu, MD   40 mg at 05/30/15 1033  . piperacillin-tazobactam (ZOSYN) IVPB 3.375 g  3.375 g Intravenous 3 times per day Titus MouldCaron G Amend, RPH   3.375 g at 05/30/15 0537  . polyvinyl alcohol (LIQUIFILM TEARS) 1.4 % ophthalmic solution 1 drop  1 drop Both Eyes PRN Lorretta HarpXilin Niu, MD   1 drop at  05/29/15 2224  . promethazine (PHENERGAN) tablet 25 mg  25 mg Oral Q6H PRN Lorretta HarpXilin Niu, MD      . vancomycin (VANCOCIN) 1,500 mg in sodium chloride 0.9 % 500 mL IVPB  1,500 mg Intravenous Q24H Titus MouldCaron G Amend, RPH   1,500 mg at 05/29/15 2334     Discharge Medications: Please see discharge summary for a list of discharge medications.  Relevant Imaging Results:  Relevant Lab Results:   Additional Information SS#[id number]  Jenita SeashoreMaggie Malory Spurr  BSW Intern, [phone]

## 2015-05-30 NOTE — Clinical Social Work Note (Signed)
Clinical Social Work Assessment  Patient Details  Name: [name] MRN: [id number] Date of Birth: 06-29-1945  Date of referral:  05/30/15               Reason for consult:  Facility Placement, Discharge Planning                Permission sought to share information with:  Chartered certified accountant granted to share information::  Yes, Verbal Permission Granted  Agency::  [name]   Housing/Transportation Living arrangements for the past 2 months:  [name] of Information:  Patient, Medical Team Patient Interpreter Needed:  None Criminal Activity/Legal Involvement Pertinent to Current Situation/Hospitalization:  No - Comment as needed Significant Relationships:  None Lives with:  Facility Resident Do you feel safe going back to the place where you live?  Yes Need for family participation in patient care:  Yes (Comment)  Care giving concerns:  Patient is a long term resident at [name]. Patient plan is to return at discharge.    Social Worker assessment / plan:  BSW intern met with patient at bedside to complete assessment. Patient was lethargic and had difficulty staying awake during assessment. Patient confirmed that she is a long term resident at [name] and has been there for 6 years now. Patient plans to return to facility at discharge. CSW will continue to follow and assist as needed.   Employment status:  Disabled (Comment on whether or not currently receiving Disability) (Receiving Disability) Insurance information:  Medicare, Medicaid In [place] PT Recommendations:  [name] / Referral to community resources:  [name]  Patient/Family's Response to care:  Patient stated that her care has been very good at the hospital.  Patient/Family's Understanding of and Emotional Response to Diagnosis, Current Treatment, and Prognosis:  Patient appears to understand  reason for patient admission, current treatment, and post discharge needs.  Emotional Assessment Appearance:  Appears older than stated age Attitude/Demeanor/Rapport:  Lethargic Affect (typically observed):  Stoic, Flat, Quiet (Lethargic) Orientation:  Oriented to Self, Oriented to Place, Oriented to  Time, Oriented to Situation Alcohol / Substance use:  Not Applicable Psych involvement (Current and /or in the community):  No (Comment)  Discharge Needs  Concerns to be addressed:  No discharge needs identified Readmission within the last 30 days:  No Current discharge risk:  Dependent with Mobility Barriers to Discharge:  Continued Medical Work up    [employer], [phone]

## 2015-05-31 DIAGNOSIS — N390 Urinary tract infection, site not specified: Secondary | ICD-10-CM

## 2015-05-31 LAB — GLUCOSE, CAPILLARY
Glucose-Capillary: 113 mg/dL — ABNORMAL HIGH (ref 65–99)
Glucose-Capillary: 105 mg/dL — ABNORMAL HIGH (ref 65–99)
Glucose-Capillary: 116 mg/dL — ABNORMAL HIGH (ref 65–99)
Glucose-Capillary: 107 mg/dL — ABNORMAL HIGH (ref 65–99)

## 2015-05-31 LAB — LEGIONELLA PNEUMOPHILA SEROGP 1 UR AG: L. pneumophila Serogp 1 Ur Ag: NEGATIVE

## 2015-05-31 LAB — RESPIRATORY VIRUS PANEL
Adenovirus: NEGATIVE
INFLUENZA B 1: NEGATIVE
Influenza A: POSITIVE — AB
METAPNEUMOVIRUS: NEGATIVE
PARAINFLUENZA 1 A: NEGATIVE
PARAINFLUENZA 2 A: NEGATIVE
PARAINFLUENZA 3 A: NEGATIVE
Respiratory Syncytial Virus A: NEGATIVE
Respiratory Syncytial Virus B: NEGATIVE
Rhinovirus: NEGATIVE

## 2015-05-31 LAB — URINE CULTURE

## 2015-05-31 MED ORDER — FUROSEMIDE 10 MG/ML IJ SOLN
20.0000 mg | Freq: Once | INTRAMUSCULAR | Status: AC
  Administered 2015-05-31: 20 mg via INTRAVENOUS
  Filled 2015-05-31: qty 2

## 2015-05-31 NOTE — Progress Notes (Signed)
Patient ID: [name], female   DOB: 05/22/1945, 70 y.o.   MRN: [id number] [facility] PROGRESS NOTE  KALEB LINQUIST PFX:[id number] DOB: 1945/07/05 DOA: 05/28/2015 PCP: [name], MD  Brief narrative:    70 y.o. female with past medical history of diabetes on insulin, asthma, depression, psoriasis, Sjogren's disease and antiphospholipid syndrome, myasthenia gravis, bipolar disorder and seizure disorder who presented to [facility] with fever, cough, shortness of breath and chest pain. She started experiencing symptoms 1 day prior to this admission and her temp was 104. 74F. Patient is from nursing home. Her oxygen saturation dropped in 80s in a nursing home. She has been taking Tamiflu for past 2 days if skilled nursing facility.  In ED, patient was found to have WBC 16.9, temperature 102.9 degrees Fahrenheit. She was tachycardic, tachypneic and hypotensive. Her lactic acid was 2.13, pro calcitonin 8.22. Influenza panel is positive for influenza A. Chest x-ray showed bibasal or atelectasis and developing pneumonia possibility. She is on Tamiflu and she was also started on broad-spectrum antibiotics for bacterial pneumonia.  Assessment/Plan:    Principal Problem: Acute respiratory failure with hypoxia in the setting of influenza pneumonia / healthcare associated pneumonia / bibasilar pneumonia - Hypoxia likely secondary to influenza pneumonia and probably bacterial pneumonia - Better resp status today - Has gotten lasix 40 mg IV on 3/28 and 20 mg IV lasix today - CXR on 3/28 stable atelectasis  - Continue current nebs - Continue current abx - Continue tamiflu   Active Problems: Sepsis secondary to influenza pneumonia and healthcare associated pneumonia - Sepsis criteria met on the admission with fever, tachycardia, tachypnea, hypotension in addition to lactic acidosis and leukocytosis and elevated procalcitonin level - Source of infection is viral and bacterial  pneumonia.  - influenza panel positive for influenza A. - Streptococcus pneumonia is negative, Legionella and blood cultures negative so far - Continue Tamiflu, vancomycin and Zosyn  Klebsiella UTI - Current abx will cover for Klebsiella UTI  Essential hypertension / sinus tachycardia - Continue metoprolol 2.5 mg IV 2 times daily - Has gotten lasix 40 mg IV 3/28 and today 20 mg IV lasix  - Monitor on telemetry  Morbid obesity - Body mass index is 44.28 kg/(m^2).  Controlled diabetes mellitus without complications with long-term insulin use - Last A1c in 2015 which was at goal. Check A1c on this admission. - Continue sliding scale insulin only   DVT Prophylaxis  - Lovenox subQ   Code Status: DNR/DNI Family Communication:  plan of care discussed with the patient Disposition Plan: To skilled nursing facility likely by a 06/02/2015  IV access:  Peripheral IV  Procedures and diagnostic studies:    Dg Chest 2 View 05/29/2015  Bibasal subsegmental atelectasis. Developing pneumonia not excluded. Clinical correlation recommended.  Medical Consultants:  None   IAnti-Infectives:   Vanco and zosyn 05/29/2015 -->    Leisa Lenz, MD  [facility] Pager [phone]  Time spent in minutes: 15 minutes  If 7PM-7AM, please contact night-coverage [email or web address] Password [facility] 05/31/2015, 4:49 PM   LOS: 2 days    HPI/Subjective: No acute overnight events. No respiratory distress.   Objective: Filed Vitals:   05/30/15 2244 05/31/15 0636 05/31/15 0844 05/31/15 1300  BP: 141/61 110/42  106/48  Pulse: 61 77    Temp: 98.7 F (37.1 C) 98 F (36.7 C)    TempSrc: Oral Oral    Resp: 16 16    Height:      Weight:  134.718  kg (297 lb)    SpO2: 96% 98% 96%     Intake/Output Summary (Last 24 hours) at 05/31/15 1649 Last data filed at 05/31/15 0511  Gross per 24 hour  Intake 4335.58 ml  Output      0 ml  Net 4335.58 ml    Exam:   General:  Pt is not in  distress  Cardiovascular: RRR, S1/S2 (+)  Respiratory: diminished, no rhonchi   Abdomen: obese, non tender, (+) BS  Extremities: No tenderness, palpable pulses   Neuro: Nonfocal  Data Reviewed: Basic Metabolic Panel:  Recent Labs Lab 05/28/15 2320 05/30/15 1232  NA 139 141  K 4.9 4.2  CL 106 111  CO2 22 22  GLUCOSE 165* 167*  BUN 12 12  CREATININE 1.21* 0.78  CALCIUM 9.8 9.8   Liver Function Tests:  Recent Labs Lab 05/28/15 2320  AST 42*  ALT 19  ALKPHOS 106  BILITOT 1.1  PROT 6.7  ALBUMIN 2.9*   No results for input(s): LIPASE, AMYLASE in the last 168 hours. No results for input(s): AMMONIA in the last 168 hours. CBC:  Recent Labs Lab 05/28/15 2320 05/30/15 1443  WBC 16.9* 7.0  NEUTROABS 15.7*  --   HGB 14.4 12.0  HCT 45.8 40.3  MCV 86.4 87.4  PLT 136* 141*   Cardiac Enzymes: No results for input(s): CKTOTAL, CKMB, CKMBINDEX, TROPONINI in the last 168 hours. BNP: Invalid input(s): POCBNP CBG:  Recent Labs Lab 05/30/15 0744 05/30/15 1136 05/30/15 1653 05/31/15 0748 05/31/15 1154  GLUCAP 93 151* 185* 113* 105*    Recent Results (from the past 240 hour(s))  Culture, blood (routine x 2)     Status: None (Preliminary result)   Collection Time: 05/28/15 11:20 PM  Result Value Ref Range Status   Specimen Description BLOOD RIGHT HAND  Final   Special Requests BOTTLES DRAWN AEROBIC AND ANAEROBIC 5CC  Final   Culture NO GROWTH 2 DAYS  Final   Report Status PENDING  Incomplete  Culture, blood (routine x 2)     Status: None (Preliminary result)   Collection Time: 05/28/15 11:22 PM  Result Value Ref Range Status   Specimen Description BLOOD LEFT HAND  Final   Special Requests BOTTLES DRAWN AEROBIC AND ANAEROBIC 5CC  Final   Culture NO GROWTH 2 DAYS  Final   Report Status PENDING  Incomplete  Respiratory virus panel     Status: Abnormal   Collection Time: 05/29/15 12:22 AM  Result Value Ref Range Status   Source - RVPAN NASOPHARYNGEAL   Corrected   Respiratory Syncytial Virus A Negative Negative Final   Respiratory Syncytial Virus B Negative Negative Final   Influenza A Positive (A) Negative Final    Comment: Subtype: H3   Influenza B Negative Negative Final   Parainfluenza 1 Negative Negative Final   Parainfluenza 2 Negative Negative Final   Parainfluenza 3 Negative Negative Final   Metapneumovirus Negative Negative Final   Rhinovirus Negative Negative Final   Adenovirus Negative Negative Final    Comment: (NOTE) Performed At: [facility] [address] [place], [place] [id number] Lindon Romp MD NI:[id number]   Urine culture     Status: None   Collection Time: 05/29/15  2:00 AM  Result Value Ref Range Status   Specimen Description URINE, RANDOM  Final   Special Requests NONE  Final   Culture   Final    40,000 COLONIES/ml KLEBSIELLA PNEUMONIAE Confirmed Extended Spectrum Beta-Lactamase Producer (ESBL)    Report Status  05/31/2015 FINAL  Final   Organism ID, Bacteria KLEBSIELLA PNEUMONIAE  Final      Susceptibility   Klebsiella pneumoniae - MIC*    AMPICILLIN >=32 RESISTANT Resistant     CEFAZOLIN >=64 RESISTANT Resistant     CEFTRIAXONE >=64 RESISTANT Resistant     CIPROFLOXACIN 0.5 SENSITIVE Sensitive     GENTAMICIN <=1 SENSITIVE Sensitive     IMIPENEM <=0.25 SENSITIVE Sensitive     NITROFURANTOIN 32 SENSITIVE Sensitive     TRIMETH/SULFA >=320 RESISTANT Resistant     AMPICILLIN/SULBACTAM 16 INTERMEDIATE Intermediate     PIP/TAZO <=4 SENSITIVE Sensitive     * 40,000 COLONIES/ml KLEBSIELLA PNEUMONIAE  MRSA PCR Screening     Status: Abnormal   Collection Time: 05/29/15 10:17 AM  Result Value Ref Range Status   MRSA by PCR POSITIVE (A) NEGATIVE Final    Comment:        The GeneXpert MRSA Assay (FDA approved for NASAL specimens only), is one component of a comprehensive MRSA colonization surveillance program. It is not intended to diagnose MRSA infection nor to guide or monitor treatment  for MRSA infections. RESULT CALLED TO, READ BACK BY AND VERIFIED WITH: Ranae Pila RN 12:30 05/29/15 ([email or web address])      Scheduled Meds: . acidophilus  1 capsule Oral BID  . aspirin  81 mg Oral Daily  . buPROPion  100 mg Oral Daily  . carbamide peroxide  5 drop Both Ears BID  . cholecalciferol  800 Units Oral Daily  . diphenhydrAMINE  25 mg Oral Q12H  . enoxaparin (LOVENOX) injection  60 mg Subcutaneous Q24H  . fluconazole  150 mg Oral Weekly  . FLUoxetine  20 mg Oral Daily  . gabapentin  600 mg Oral TID  . insulin aspart  0-5 Units Subcutaneous QHS  . insulin aspart  0-9 Units Subcutaneous TID WC  . ipratropium  0.5 mg Nebulization TID  . levalbuterol  1.25 mg Nebulization TID  . metoprolol  2.5 mg Intravenous 3 times per day  . morphine  30 mg Oral Q12H  . mupirocin ointment  1 application Topical Daily  . nystatin  1 Bottle Topical BID  . oseltamivir  75 mg Oral BID  . pantoprazole  40 mg Oral Daily  . piperacillin-tazobactam (ZOSYN)  IV  3.375 g Intravenous 3 times per day  . vancomycin  1,500 mg Intravenous Q24H   Continuous Infusions: . sodium chloride 10 mL/hr at 05/30/15 1234

## 2015-06-01 LAB — BASIC METABOLIC PANEL
Anion gap: 9 (ref 5–15)
BUN: 8 mg/dL (ref 6–20)
CALCIUM: 9.6 mg/dL (ref 8.9–10.3)
CHLORIDE: 105 mmol/L (ref 101–111)
CO2: 28 mmol/L (ref 22–32)
CREATININE: 0.8 mg/dL (ref 0.44–1.00)
GFR calc non Af Amer: >60 mL/min (ref >60)
GLUCOSE: 116 mg/dL — AB (ref 65–99)
Potassium: 3.7 mmol/L (ref 3.5–5.1)
Sodium: 142 mmol/L (ref 135–145)

## 2015-06-01 LAB — CBC
HEMATOCRIT: 38.8 % (ref 36.0–46.0)
HEMOGLOBIN: 11.4 g/dL — AB (ref 12.0–15.0)
MCH: 25.9 pg — AB (ref 26.0–34.0)
MCHC: 29.4 g/dL — AB (ref 30.0–36.0)
MCV: 88 fL (ref 78.0–100.0)
Platelets: 127 10*3/uL — ABNORMAL LOW (ref 150–400)
RBC: 4.41 MIL/uL (ref 3.87–5.11)
RDW: 15.6 % — AB (ref 11.5–15.5)
WBC: 5.3 10*3/uL (ref 4.0–10.5)

## 2015-06-01 LAB — GLUCOSE, CAPILLARY: Glucose-Capillary: 122 mg/dL — ABNORMAL HIGH (ref 65–99)

## 2015-06-01 LAB — HEMOGLOBIN A1C
Hgb A1c MFr Bld: 6.3 % — ABNORMAL HIGH (ref 4.8–5.6)
Mean Plasma Glucose: 134 mg/dL

## 2015-06-01 MED ORDER — OSELTAMIVIR PHOSPHATE 75 MG PO CAPS: 75.0000 mg | ORAL_CAPSULE | Freq: Two times a day (BID) | ORAL | Status: AC

## 2015-06-01 MED ORDER — CIPROFLOXACIN HCL 500 MG PO TABS: 500.0000 mg | ORAL_TABLET | Freq: Two times a day (BID) | ORAL | Status: AC

## 2015-06-01 MED ORDER — INSULIN GLARGINE 100 UNIT/ML ~~LOC~~ SOLN
20.0000 [IU] | Freq: Every day | SUBCUTANEOUS | Status: AC
Start: 2015-06-01 — End: ?

## 2015-06-01 NOTE — Clinical Social Work Note (Signed)
Per MD [facility]. RN and facility notified of patient's DC. CSW does not have contact information for member's family and the SNF states that they have not been able to reach family either. RN given number for report. DC packet on patient's chart. Ambulance transport requested for patient. CSW signing off at this time.   Roddie McBryant Clydia Nieves MSW, SummersLCSW, [place], [phone]

## 2015-06-01 NOTE — Discharge Summary (Signed)
Physician Discharge Summary  LASHAWNNA [name] YJE:[id number] DOB: [birth year] DOA: 05/28/2015  PCP: [name], MD  Admit date: 05/28/2015 Discharge date: 06/01/2015  Recommendations for Outpatient Follow-up:  1. Please reassess pain medications in nursing home. Patient is too drowsy with current pain medication regimen so recommendation is to continue to reassess on daily basis what the narcotic need is for the patient. 2. Continue ciprofloxacin for 5 days on discharge for Klebsiella UTI. 3. Continue Tamiflu for 1 more day on discharge. Please note she needs one dose tonight in skilled nursing facility and then tomorrow 3/31 the dosages in the morning and 1 in the night.  Discharge Diagnoses:  Principal Problem:   Acute respiratory failure with hypoxia ([place]) Active Problems:   HCAP (healthcare-associated pneumonia)   Influenza A with pneumonia   Leukocytosis   Controlled diabetes mellitus without complication, with long-term current use of insulin ([name])   Morbid obesity due to excess calories ([name])   Discharge Condition: stable   Diet recommendation: as tolerated   History of present illness:  70 y.o. female with past medical history of diabetes on insulin, asthma, depression, psoriasis, Sjogren's disease and antiphospholipid syndrome, myasthenia gravis, bipolar disorder and seizure disorder who presented to [facility] with fever, cough, shortness of breath and chest pain. She started experiencing symptoms 1 day prior to this admission and her temp was 104. 7F. Patient is from nursing home. Her oxygen saturation dropped in 80s in a nursing home. She has been taking Tamiflu for past 2 days if skilled nursing facility.  In ED, patient was found to have WBC 16.9, temperature 102.9 degrees Fahrenheit. She was tachycardic, tachypneic and hypotensive. Her lactic acid was 2.13, pro calcitonin 8.22. Influenza panel is positive for influenza A. Chest x-ray showed bibasal or atelectasis  and developing pneumonia possibility. She is on Tamiflu and she was also started on broad-spectrum antibiotics for bacterial pneumonia.  Hospital Course:   Assessment/Plan:    Principal Problem: Acute respiratory failure with hypoxia in the setting of influenza pneumonia / healthcare associated pneumonia / bibasilar pneumonia - Hypoxia likely secondary to influenza pneumonia and probably bacterial pneumonia - Has gotten lasix 40 mg IV on 3/28 and 20 mg IV lasix 06/01/2015 - Respiratory status stable - CXR on 3/28 stable atelectasis  - We will continue ciprofloxacin on discharge. She will continue Tamiflu as noted above for influenza pneumonia. Her influenza test was positive for influenza A type H3  Active Problems: Sepsis secondary to influenza pneumonia and healthcare associated pneumonia - Sepsis criteria met on the admission with fever, tachycardia, tachypnea, hypotension in addition to lactic acidosis and leukocytosis and elevated procalcitonin level - Source of infection is viral and bacterial pneumonia.  - influenza panel positive for influenza A. - Streptococcus pneumonia is negative, Legionella and blood cultures negative so far - Continue Tamiflu, one dose tonight and then continue Tamiflu through tomorrow in the morning and at night - Stopped vancomycin and Zosyn prior to discharge  Klebsiella UTI - ciprofloxacin for 5 days on discharge.  Essential hypertension / sinus tachycardia - Blood pressure stable, 151/52 prior to blood pressure medications being given. Continue Lasix per home dose  Morbid obesity - Body mass index is 44.28 kg/(m^2).  Controlled diabetes mellitus without complications with long-term insulin use - Last A1c in 2015 which was at goal.  - A1c 6.3 on this admission - Continue insulin regimen, insulin glargine 20 units at bedtime because her CBGs were 107, 116, 105. Her usual dose is  doubled this dose. She can continue glargine 20 units at bedtime  along with sliding scale insulin   DVT Prophylaxis  - Lovenox subQ while patient in hospital   Code Status: DNR/DNI Family Communication: family not at the bedside this am    IV access:  Peripheral IV  Procedures and diagnostic studies:   Dg Chest 2 View 05/29/2015 Bibasal subsegmental atelectasis. Developing pneumonia not excluded. Clinical correlation recommended.  Medical Consultants:  [name]   IAnti-Infectives:   Vanco and zosyn 05/29/2015 --> 06/01/2015    Signed:  Leisa Lenz, MD  [facility] 06/01/2015, 11:15 AM  Pager #: [phone]  Time spent in minutes: more than 30 minutes   Discharge Exam: Filed Vitals:   05/31/15 2234 06/01/15 0500  BP: 140/57 151/52  Pulse: 78 69  Temp: 98.5 F (36.9 C) 98.4 F (36.9 C)  Resp: 20 17   Filed Vitals:   05/31/15 2157 05/31/15 2234 06/01/15 0500 06/01/15 0846  BP:  140/57 151/52   Pulse:  78 69   Temp:  98.5 F (36.9 C) 98.4 F (36.9 C)   TempSrc:  Oral Oral   Resp:  20 17   Height:      Weight:   136.079 kg (300 lb)   SpO2: 93% 94% 93% 95%    General: Pt is alert, follows commands appropriately, not in acute distress Cardiovascular: Regular rate and rhythm, S1/S2 +, no murmurs Respiratory: Clear to auscultation bilaterally, no wheezing, no crackles, no rhonchi Abdominal: Soft, non tender, non distended, bowel sounds +, no guarding Extremities: no edema, no cyanosis, pulses palpable bilaterally DP and PT Neuro: Grossly nonfocal  Discharge Instructions  Discharge Instructions    Call MD for:  difficulty breathing, headache or visual disturbances    Complete by:  As directed      Call MD for:  persistant dizziness or light-headedness    Complete by:  As directed      Call MD for:  persistant nausea and vomiting    Complete by:  As directed      Call MD for:  redness, tenderness, or signs of infection (pain, swelling, redness, odor or green/yellow discharge around incision site)     Complete by:  As directed      Diet - low sodium heart healthy    Complete by:  As directed      Discharge instructions    Complete by:  As directed   1. Please reassess pain medications in nursing home. Patient is too drowsy with current pain medication regimen so recommendation is to continue to reassess on daily basis what the narcotic need is for the patient. 2. Continue ciprofloxacin for 5 days on discharge for Klebsiella UTI. 3. Continue Tamiflu for 1 more day on discharge. Please note she needs one dose tonight in skilled nursing facility and then tomorrow the dosages in the morning and 1 in the night.     Increase activity slowly    Complete by:  As directed             Medication List    STOP taking these medications        amoxicillin-clavulanate 875-125 MG tablet  Commonly known as:  AUGMENTIN     budesonide 0.25 MG/2ML nebulizer solution  Commonly known as:  PULMICORT     doxycycline 100 MG tablet  Commonly known as:  VIBRA-TABS     HYDROmorphone 2 MG tablet  Commonly known as:  DILAUDID     methadone 10  MG tablet  Commonly known as:  DOLOPHINE     morphine 30 MG 12 hr tablet  Commonly known as:  MS CONTIN     predniSONE 10 MG tablet  Commonly known as:  DELTASONE     sulfamethoxazole-trimethoprim 800-160 MG per tablet  Commonly known as:  BACTRIM DS      TAKE these medications        acidophilus Caps capsule  Take 1 capsule by mouth 2 (two) times daily.     aspirin 81 MG chewable tablet  Chew 81 mg by mouth daily.     buPROPion 100 MG 12 hr tablet  Commonly known as:  WELLBUTRIN SR  Take 100 mg by mouth daily.     calcipotriene 0.005 % cream  Commonly known as:  DOVONOX  Apply topically 2 (two) times daily.     calcium carbonate 500 MG chewable tablet  Commonly known as:  TUMS - dosed in mg elemental calcium  Chew 1 tablet by mouth every 6 (six) hours as needed for indigestion or heartburn.     carbamide peroxide 6.5 % otic solution   Commonly known as:  DEBROX  Place 5 drops into both ears 2 (two) times daily.     cholecalciferol 400 units Tabs tablet  Commonly known as:  VITAMIN D  Take 800 Units by mouth daily.     ciprofloxacin 500 MG tablet  Commonly known as:  CIPRO  Take 1 tablet (500 mg total) by mouth 2 (two) times daily.     diphenhydrAMINE 25 MG tablet  Commonly known as:  BENADRYL  Take 25 mg by mouth every 12 (twelve) hours.     diphenoxylate-atropine 2.5-0.025 MG tablet  Commonly known as:  LOMOTIL  Take 2 tablets by mouth 4 (four) times daily as needed for diarrhea or loose stools.     fluconazole 150 MG tablet  Commonly known as:  DIFLUCAN  Take 150 mg by mouth once a week. On Thursday     FLUoxetine 20 MG capsule  Commonly known as:  PROZAC  Take 20 mg by mouth daily.     Fluticasone-Salmeterol 250-50 MCG/DOSE Aepb  Commonly known as:  ADVAIR  Inhale 1 puff into the lungs 2 (two) times daily.     furosemide 20 MG tablet  Commonly known as:  LASIX  Take 20 mg by mouth 2 (two) times daily.     gabapentin 600 MG tablet  Commonly known as:  NEURONTIN  Take 600 mg by mouth 3 (three) times daily.     glipiZIDE 5 MG tablet  Commonly known as:  GLUCOTROL  Take 5 mg by mouth daily before breakfast.     guaiFENesin 600 MG 12 hr tablet  Commonly known as:  MUCINEX  Take 600 mg by mouth 2 (two) times daily as needed for cough.     insulin aspart 100 UNIT/ML injection  Commonly known as:  novoLOG  Inject 0-10 Units into the skin 3 (three) times daily before meals. 0-149 0 units  150-200 2 units 201 -250 4 units 251-300 6 units  301-- 350 8 units 351-400 10 units  401 + call MD     insulin glargine 100 UNIT/ML injection  Commonly known as:  LANTUS  Inject 0.2 mLs (20 Units total) into the skin at bedtime.     loperamide 2 MG tablet  Commonly known as:  IMODIUM A-D  Take 4 mg by mouth 4 (four) times daily as needed for diarrhea or loose  stools.     magnesium hydroxide 400 MG/5ML  suspension  Commonly known as:  MILK OF MAGNESIA  Take 30 mLs by mouth daily as needed for mild constipation.     mupirocin ointment 2 %  Commonly known as:  BACTROBAN  Place 1 application into the nose daily. To right posterior thigh and wrap in karlex     nystatin 100000 UNIT/GM Powd  Apply 1 Bottle topically 2 (two) times daily.     omeprazole 20 MG capsule  Commonly known as:  PRILOSEC  Take 20 mg by mouth daily.     oseltamivir 75 MG capsule  Commonly known as:  TAMIFLU  Take 1 capsule (75 mg total) by mouth 2 (two) times daily.     polyvinyl alcohol 1.4 % ophthalmic solution  Commonly known as:  LIQUIFILM TEARS  Place 1 drop into both eyes as needed for dry eyes.     promethazine 25 MG tablet  Commonly known as:  PHENERGAN  Take 25 mg by mouth every 6 (six) hours as needed for nausea or vomiting.     saccharomyces boulardii 250 MG capsule  Commonly known as:  FLORASTOR  Take 1 capsule (250 mg total) by mouth 2 (two) times daily.     sodium chloride 0.65 % nasal spray  Commonly known as:  OCEAN  Place 1 spray into the nose as needed for congestion. May keep at bedside           Follow-up Information    Follow up with [name], MD. Schedule an appointment as soon as possible for a visit in 2 weeks.   Specialty:  Internal Medicine   Why:  Follow up appt after recent hospitalization   Contact information:   [place] [address] [phone]        The results of significant diagnostics from this hospitalization (including imaging, microbiology, ancillary and laboratory) are listed below for reference.    Significant Diagnostic Studies: Dg Chest 2 View  05/29/2015  CLINICAL DATA:  70 year old female with cough and sepsis EXAM: CHEST  2 VIEW COMPARISON:  Radiograph dated 12/29/2014 FINDINGS: Two views of the chest demonstrate bibasilar atelectatic changes. Developing pneumonia is not excluded. There is shallow inspiration. No  significant pleural effusion or pneumothorax. The cardiac silhouette is within normal limits. No acute osseous pathology. IMPRESSION: Bibasal subsegmental atelectasis. Developing pneumonia not excluded. Clinical correlation recommended. Electronically Signed   By: Anner Crete M.D.   On: 05/29/2015 00:27   Dg Chest Port 1 View  05/30/2015  CLINICAL DATA:  Shortness of breath. EXAM: PORTABLE CHEST 1 VIEW COMPARISON:  May 28, 2015. FINDINGS: Stable cardiomegaly. No pneumothorax or pleural effusion is noted. Stable mild bibasilar subsegmental atelectasis. Bony thorax is intact. IMPRESSION: Stable mild bibasilar subsegmental atelectasis. Electronically Signed   By: Marijo Conception, M.D.   On: 05/30/2015 16:41    Microbiology: Recent Results (from the past 240 hour(s))  Culture, blood (routine x 2)     Status: [name] (Preliminary result)   Collection Time: 05/28/15 11:20 PM  Result Value Ref Range Status   Specimen Description BLOOD RIGHT HAND  Final   Special Requests BOTTLES DRAWN AEROBIC AND ANAEROBIC 5CC  Final   Culture NO GROWTH 2 DAYS  Final   Report Status PENDING  Incomplete  Culture, blood (routine x 2)     Status: [name] (Preliminary result)   Collection Time: 05/28/15 11:22 PM  Result Value Ref Range Status   Specimen  Description BLOOD LEFT HAND  Final   Special Requests BOTTLES DRAWN AEROBIC AND ANAEROBIC 5CC  Final   Culture NO GROWTH 2 DAYS  Final   Report Status PENDING  Incomplete  Respiratory virus panel     Status: Abnormal   Collection Time: 05/29/15 12:22 AM  Result Value Ref Range Status   Source - RVPAN NASOPHARYNGEAL  Corrected   Respiratory Syncytial Virus A Negative Negative Final   Respiratory Syncytial Virus B Negative Negative Final   Influenza A Positive (A) Negative Final    Comment: Subtype: H3   Influenza B Negative Negative Final   Parainfluenza 1 Negative Negative Final   Parainfluenza 2 Negative Negative Final   Parainfluenza 3 Negative Negative Final    Metapneumovirus Negative Negative Final   Rhinovirus Negative Negative Final   Adenovirus Negative Negative Final    Comment: (NOTE) Performed At: [facility] [place], [place] [id number] Lindon Romp MD PF:[id number]   Urine culture     Status: [name]   Collection Time: 05/29/15  2:00 AM  Result Value Ref Range Status   Specimen Description URINE, RANDOM  Final   Special Requests [name]  Final   Culture   Final    40,000 COLONIES/ml KLEBSIELLA PNEUMONIAE Confirmed Extended Spectrum Beta-Lactamase Producer (ESBL)    Report Status 05/31/2015 FINAL  Final   Organism ID, Bacteria KLEBSIELLA PNEUMONIAE  Final      Susceptibility   Klebsiella pneumoniae - MIC*    AMPICILLIN >=32 RESISTANT Resistant     CEFAZOLIN >=64 RESISTANT Resistant     CEFTRIAXONE >=64 RESISTANT Resistant     CIPROFLOXACIN 0.5 SENSITIVE Sensitive     GENTAMICIN <=1 SENSITIVE Sensitive     IMIPENEM <=0.25 SENSITIVE Sensitive     NITROFURANTOIN 32 SENSITIVE Sensitive     TRIMETH/SULFA >=320 RESISTANT Resistant     AMPICILLIN/SULBACTAM 16 INTERMEDIATE Intermediate     PIP/TAZO <=4 SENSITIVE Sensitive     * 40,000 COLONIES/ml KLEBSIELLA PNEUMONIAE  MRSA PCR Screening     Status: Abnormal   Collection Time: 05/29/15 10:17 AM  Result Value Ref Range Status   MRSA by PCR POSITIVE (A) NEGATIVE Final    Comment:        The GeneXpert MRSA Assay (FDA approved for NASAL specimens only), is one component of a comprehensive MRSA colonization surveillance program. It is not intended to diagnose MRSA infection nor to guide or monitor treatment for MRSA infections. RESULT CALLED TO, READ BACK BY AND VERIFIED WITH: Ranae Pila RN 12:30 05/29/15 ([email or web address])      Labs: Basic Metabolic Panel:  Recent Labs Lab 05/28/15 2320 05/30/15 1232 06/01/15 0400  NA 139 141 142  K 4.9 4.2 3.7  CL 106 111 105  CO2 _0 GLUCOSE 165* 167* 116*  BUN _1 CREATININE 1.21* 0.78 0.80  CALCIUM  9.8 9.8 9.6   Liver Function Tests:  Recent Labs Lab 05/28/15 2320  AST 42*  ALT 19  ALKPHOS 106  BILITOT 1.1  PROT 6.7  ALBUMIN 2.9*   No results for input(s): LIPASE, AMYLASE in the last 168 hours. No results for input(s): AMMONIA in the last 168 hours. CBC:  Recent Labs Lab 05/28/15 2320 05/30/15 1443 06/01/15 0400  WBC 16.9* 7.0 5.3  NEUTROABS 15.7*  --   --   HGB 14.4 12.0 11.4*  HCT 45.8 40.3 38.8  MCV 86.4 87.4 88.0  PLT 136* 141* 127*   Cardiac Enzymes: No results for  input(s): CKTOTAL, CKMB, CKMBINDEX, TROPONINI in the last 168 hours. BNP: BNP (last 3 results) No results for input(s): BNP in the last 8760 hours.  ProBNP (last 3 results) No results for input(s): PROBNP in the last 8760 hours.  CBG:  Recent Labs Lab 05/31/15 0748 05/31/15 1154 05/31/15 1651 05/31/15 2226 06/01/15 0748  GLUCAP 113* 105* 116* 107* 122*

## 2015-06-01 NOTE — Care Management Note (Signed)
Case Management Note  Patient Details  Name: [name] MRN: [id number] Date of Birth: 1945/05/10  Subjective/Objective:Pt admitted for Acute Respiratory Failure with Hypoxia. Pt is a  [facility] and has been there for 6 years now. Patient plans to return to facility at discharge 06-01-15.   Action/Plan: CSW to assist with disposition needs. No further needs from CM at this time.    Expected Discharge Date:                  Expected Discharge Plan:  Skilled Nursing Facility  In-House Referral:  Clinical Social Work  Discharge planning Services  CM Consult  Post Acute Care Choice:  NA Choice offered to:  NA  DME Arranged:  N/A DME Agency:  NA  HH Arranged:  NA HH Agency:  NA  Status of Service:  Completed, signed off  Medicare Important Message Given:    Date Medicare IM Given:    Medicare IM give by:    Date Additional Medicare IM Given:    Additional Medicare Important Message give by:     If discussed at Long Length of Stay Meetings, dates discussed:    Additional Comments:  Gala LewandowskyGraves-Bigelow, Feliciana Narayan Kaye, RN 06/01/2015, 12:05 PM

## 2015-06-01 NOTE — Discharge Instructions (Signed)

## 2015-06-02 ENCOUNTER — Emergency Department (HOSPITAL_COMMUNITY)
Admission: EM | Admit: 2015-06-02 | Discharge: 2015-06-02 | Disposition: A | Discharge status: Home / Self Care | Payer: Medicare Other | Attending: Emergency Medicine | Admitting: Emergency Medicine

## 2015-06-02 ENCOUNTER — Emergency Department (HOSPITAL_COMMUNITY): Payer: Medicare Other

## 2015-06-02 ENCOUNTER — Encounter (HOSPITAL_COMMUNITY): Payer: Self-pay | Admitting: *Deleted

## 2015-06-02 DIAGNOSIS — J45909 Unspecified asthma, uncomplicated: Secondary | ICD-10-CM | POA: Diagnosis not present

## 2015-06-02 DIAGNOSIS — R21 Rash and other nonspecific skin eruption: Secondary | ICD-10-CM | POA: Diagnosis not present

## 2015-06-02 DIAGNOSIS — F419 Anxiety disorder, unspecified: Secondary | ICD-10-CM | POA: Insufficient documentation

## 2015-06-02 DIAGNOSIS — G8929 Other chronic pain: Secondary | ICD-10-CM | POA: Diagnosis not present

## 2015-06-02 DIAGNOSIS — Z8614 Personal history of Methicillin resistant Staphylococcus aureus infection: Secondary | ICD-10-CM | POA: Insufficient documentation

## 2015-06-02 DIAGNOSIS — R1013 Epigastric pain: Secondary | ICD-10-CM | POA: Insufficient documentation

## 2015-06-02 DIAGNOSIS — Z79899 Other long term (current) drug therapy: Secondary | ICD-10-CM | POA: Insufficient documentation

## 2015-06-02 DIAGNOSIS — R0602 Shortness of breath: Secondary | ICD-10-CM | POA: Diagnosis present

## 2015-06-02 DIAGNOSIS — Z880 Allergy status to penicillin: Secondary | ICD-10-CM | POA: Insufficient documentation

## 2015-06-02 DIAGNOSIS — R0600 Dyspnea, unspecified: Secondary | ICD-10-CM | POA: Insufficient documentation

## 2015-06-02 DIAGNOSIS — F329 Major depressive disorder, single episode, unspecified: Secondary | ICD-10-CM | POA: Diagnosis not present

## 2015-06-02 DIAGNOSIS — Z87891 Personal history of nicotine dependence: Secondary | ICD-10-CM | POA: Insufficient documentation

## 2015-06-02 DIAGNOSIS — Z8719 Personal history of other diseases of the digestive system: Secondary | ICD-10-CM | POA: Diagnosis not present

## 2015-06-02 DIAGNOSIS — G7000 Myasthenia gravis without (acute) exacerbation: Secondary | ICD-10-CM | POA: Insufficient documentation

## 2015-06-02 DIAGNOSIS — Z794 Long term (current) use of insulin: Secondary | ICD-10-CM | POA: Insufficient documentation

## 2015-06-02 DIAGNOSIS — R600 Localized edema: Secondary | ICD-10-CM | POA: Diagnosis not present

## 2015-06-02 DIAGNOSIS — Z87440 Personal history of urinary (tract) infections: Secondary | ICD-10-CM | POA: Diagnosis not present

## 2015-06-02 DIAGNOSIS — Z862 Personal history of diseases of the blood and blood-forming organs and certain disorders involving the immune mechanism: Secondary | ICD-10-CM | POA: Diagnosis not present

## 2015-06-02 DIAGNOSIS — E119 Type 2 diabetes mellitus without complications: Secondary | ICD-10-CM | POA: Insufficient documentation

## 2015-06-02 DIAGNOSIS — Z872 Personal history of diseases of the skin and subcutaneous tissue: Secondary | ICD-10-CM | POA: Diagnosis not present

## 2015-06-02 LAB — COMPREHENSIVE METABOLIC PANEL
ALT: 13 U/L — ABNORMAL LOW (ref 14–54)
ANION GAP: 11 (ref 5–15)
AST: 24 U/L (ref 15–41)
Albumin: 3.4 g/dL — ABNORMAL LOW (ref 3.5–5.0)
Alkaline Phosphatase: 74 U/L (ref 38–126)
BILIRUBIN TOTAL: 1 mg/dL (ref 0.3–1.2)
BUN: 5 mg/dL — AB (ref 6–20)
CHLORIDE: 101 mmol/L (ref 101–111)
CO2: 28 mmol/L (ref 22–32)
Calcium: 9.9 mg/dL (ref 8.9–10.3)
Creatinine, Ser: 0.57 mg/dL (ref 0.44–1.00)
Glucose, Bld: 144 mg/dL — ABNORMAL HIGH (ref 65–99)
POTASSIUM: 3.2 mmol/L — AB (ref 3.5–5.1)
Sodium: 140 mmol/L (ref 135–145)
TOTAL PROTEIN: 6.8 g/dL (ref 6.5–8.1)

## 2015-06-02 LAB — BLOOD GAS, VENOUS
Acid-Base Excess: 3.6 mmol/L — ABNORMAL HIGH (ref 0.0–2.0)
Bicarbonate: 27.8 mEq/L — ABNORMAL HIGH (ref 20.0–24.0)
O2 SAT: 70.6 %
PCO2 VEN: 42.4 mmHg — AB (ref 45.0–50.0)
PO2 VEN: 37.8 mmHg (ref 31.0–45.0)
Patient temperature: 98.6
TCO2: 24.9 mmol/L (ref 0–100)
pH, Ven: 7.432 — ABNORMAL HIGH (ref 7.250–7.300)

## 2015-06-02 LAB — I-STAT TROPONIN, ED: TROPONIN I, POC: 0 ng/mL (ref 0.00–0.08)

## 2015-06-02 LAB — CBC WITH DIFFERENTIAL/PLATELET
BASOS ABS: 0 10*3/uL (ref 0.0–0.1)
Basophils Relative: 0 %
EOS PCT: 0 %
Eosinophils Absolute: 0 10*3/uL (ref 0.0–0.7)
HCT: 40.4 % (ref 36.0–46.0)
HEMOGLOBIN: 12.7 g/dL (ref 12.0–15.0)
LYMPHS ABS: 1 10*3/uL (ref 0.7–4.0)
LYMPHS PCT: 18 %
MCH: 26.5 pg (ref 26.0–34.0)
MCHC: 31.4 g/dL (ref 30.0–36.0)
MCV: 84.2 fL (ref 78.0–100.0)
Monocytes Absolute: 0.2 10*3/uL (ref 0.1–1.0)
Monocytes Relative: 4 %
NEUTROS PCT: 78 %
Neutro Abs: 4.4 10*3/uL (ref 1.7–7.7)
PLATELETS: 126 10*3/uL — AB (ref 150–400)
RBC: 4.8 MIL/uL (ref 3.87–5.11)
RDW: 14.5 % (ref 11.5–15.5)
WBC: 5.6 10*3/uL (ref 4.0–10.5)

## 2015-06-02 LAB — LIPASE, BLOOD: Lipase: 59 U/L — ABNORMAL HIGH (ref 11–51)

## 2015-06-02 LAB — D-DIMER, QUANTITATIVE: D-Dimer, Quant: 0.58 ug/mL-FEU — ABNORMAL HIGH (ref 0.00–0.50)

## 2015-06-02 LAB — BRAIN NATRIURETIC PEPTIDE: B NATRIURETIC PEPTIDE 5: 234.8 pg/mL — AB (ref 0.0–100.0)

## 2015-06-02 MED ORDER — ALBUTEROL SULFATE (2.5 MG/3ML) 0.083% IN NEBU
5.0000 mg | INHALATION_SOLUTION | Freq: Once | RESPIRATORY_TRACT | Status: AC
  Administered 2015-06-02: 5 mg via RESPIRATORY_TRACT
  Filled 2015-06-02: qty 6

## 2015-06-02 MED ORDER — IOPAMIDOL (ISOVUE-370) INJECTION 76%: 100.0000 mL | Freq: Once | INTRAVENOUS | Status: DC | PRN

## 2015-06-02 MED ORDER — LORAZEPAM 2 MG/ML IJ SOLN
1.0000 mg | Freq: Once | INTRAMUSCULAR | Status: AC
  Administered 2015-06-02: 1 mg via INTRAVENOUS
  Filled 2015-06-02: qty 1

## 2015-06-02 MED ORDER — FUROSEMIDE 10 MG/ML IJ SOLN
20.0000 mg | Freq: Once | INTRAMUSCULAR | Status: AC
  Administered 2015-06-02: 20 mg via INTRAVENOUS
  Filled 2015-06-02: qty 4

## 2015-06-02 NOTE — Progress Notes (Addendum)
[facility] and has been there for 6 years now. [facility] in stable condition with instructions to f/u with pcp in 1-2 weeks Dr Pixie CasinoJ Kim and to take ciprofloxacin for 5 more days  for Klebsiella UTI, continue Tamiflu for 1 more day on discharge  Pt has noted to be screaming out at intervals [facility] requesting to get up out of bed, refusing CT initially

## 2015-06-02 NOTE — ED Notes (Signed)
[facility] staff was notified of pt's discharge---- discharge instructions also provided.

## 2015-06-02 NOTE — ED Notes (Signed)
[facility] contacted for transport 

## 2015-06-02 NOTE — ED Notes (Signed)
[facility] here to transport pt back [facility].

## 2015-06-02 NOTE — ED Notes (Signed)
Bed: [facility] Expected date:  Expected time:  Means of arrival:  Comments: 

## 2015-06-02 NOTE — ED Provider Notes (Signed)
CSN: [id number]     Arrival date & time 06/02/15  1154 History   First MD Initiated Contact with Patient 06/02/15 1224     Chief Complaint  Patient presents with  . Shortness of Breath     Patient is a 70 y.o. female presenting with shortness of breath. The history is provided by the patient and the EMS personnel. No language interpreter was used.  Shortness of Breath  [name] is a 70 y.o. female who presents to the Emergency Department complaining of SOB.  Level V caveat due to confusion.  She reports increased shortness of breath for "a long time". She is unsure of fevers. She denies any chest pain, abdominal pain. She states she has a new rash on her arms and legs (on chart review this is chronic). She was recently discharged from the hospital and is on antibiotics for a possible pneumonia.  Past Medical History  Diagnosis Date  . MRSA cellulitis   . Psoriasis   . Obesities, morbid (HCC)   . Sjogren's disease (HCC)   . UTI (urinary tract infection)   . Antiphospholipid syndrome (HCC)   . Diverticulosis   . Anxiety   . Depression   . Asthma   . Ventral hernia   . Diabetes mellitus   . Myasthenia gravis   . Myasthenia gravis   . Myasthenia gravis   . Back pain, chronic     missing some vertebra per pt  . Complication of anesthesia     can't have muscle relaxers -my. gravis  . Bipolar disorder, unspecified (HCC)   . Seizures [facility])    Past Surgical History  Procedure Laterality Date  . Abdominal hysterectomy    . Pilonidal cyst excision    . Cholecystectomy    . Appendectomy     Family History  Problem Relation Age of Onset  . Adopted: Yes   Social History  Substance Use Topics  . Smoking status: Former Games developer  . Smokeless tobacco: Never Used  . Alcohol Use: No   OB History    No data available     Review of Systems  Respiratory: Positive for shortness of breath.   All other systems reviewed and are negative.     Allergies  Other; Penicillins;  and Tetracyclines & related  Home Medications   Prior to Admission medications   Medication Sig Start Date End Date Taking? Authorizing Provider  acidophilus (RISAQUAD) CAPS Take 1 capsule by mouth 2 (two) times daily.     Yes Historical Provider, MD  calcipotriene (DOVONOX) 0.005 % cream Apply topically 2 (two) times daily.   Yes Historical Provider, MD  calcium carbonate (TUMS - DOSED IN MG ELEMENTAL CALCIUM) 500 MG chewable tablet Chew 1 tablet by mouth every 6 (six) hours as needed for indigestion or heartburn.   Yes Historical Provider, MD  cholecalciferol (VITAMIN D) 400 units TABS tablet Take 800 Units by mouth daily.   Yes Historical Provider, MD  ciprofloxacin (CIPRO) 500 MG tablet Take 1 tablet (500 mg total) by mouth 2 (two) times daily. Patient taking differently: Take 500 mg by mouth 2 (two) times daily. Started 03/30 for 5 days 06/01/15  Yes Alison Murray, MD  diphenhydrAMINE (BENADRYL) 25 MG tablet Take 25 mg by mouth every 12 (twelve) hours.   Yes Historical Provider, MD  diphenoxylate-atropine (LOMOTIL) 2.5-0.025 MG tablet Take 2 tablets by mouth every 6 (six) hours as needed for diarrhea or loose stools.    Yes Historical Provider,  MD  fluconazole (DIFLUCAN) 150 MG tablet Take 150 mg by mouth every Thursday.    Yes Historical Provider, MD  FLUoxetine (PROZAC) 20 MG capsule Take 20 mg by mouth daily.   Yes Historical Provider, MD  furosemide (LASIX) 20 MG tablet Take 20 mg by mouth 2 (two) times daily.     Yes Historical Provider, MD  gabapentin (NEURONTIN) 600 MG tablet Take 600 mg by mouth 3 (three) times daily.   Yes Historical Provider, MD  guaiFENesin (MUCINEX) 600 MG 12 hr tablet Take 600 mg by mouth 2 (two) times daily as needed for cough.   Yes Historical Provider, MD  haloperidol lactate (HALDOL) 5 MG/ML injection Inject 5 mg into the muscle once.   Yes Historical Provider, MD  insulin aspart (NOVOLOG) 100 UNIT/ML injection Inject 0-10 Units into the skin 3 (three) times  daily before meals. 0-149 0 units  150-200 2 units 201 -250 4 units 251-300 6 units  301-350 8 units  351-400 10 units  401 plus call MD 301-- 350 8 units 351-400 10 units  401 + call MD   Yes Historical Provider, MD  insulin glargine (LANTUS) 100 UNIT/ML injection Inject 0.2 mLs (20 Units total) into the skin at bedtime. 06/01/15  Yes Alison MurrayAlma M Devine, MD  loperamide (IMODIUM A-D) 2 MG tablet Take 4 mg by mouth 4 (four) times daily as needed for diarrhea or loose stools.   Yes Historical Provider, MD  magnesium hydroxide (MILK OF MAGNESIA) 400 MG/5ML suspension Take 30 mLs by mouth daily as needed for mild constipation.   Yes Historical Provider, MD  omeprazole (PRILOSEC) 20 MG capsule Take 20 mg by mouth daily.     Yes Historical Provider, MD  polyvinyl alcohol (LIQUIFILM TEARS) 1.4 % ophthalmic solution Place 1 drop into both eyes as needed for dry eyes.   Yes Historical Provider, MD  promethazine (PHENERGAN) 25 MG suppository Place 25 mg rectally every 6 (six) hours as needed for nausea or vomiting.   Yes Historical Provider, MD  saccharomyces boulardii (FLORASTOR) 250 MG capsule Take 1 capsule (250 mg total) by mouth 2 (two) times daily. 12/31/14  Yes Marinda ElkAbraham Feliz Ortiz, MD  sodium chloride (OCEAN) 0.65 % nasal spray Place 1 spray into the nose as needed for congestion. May keep at bedside   Yes Historical Provider, MD  buPROPion [facility](WELLBUTRIN SR) 100 MG 12 hr tablet Take 100 mg by mouth daily.    Historical Provider, MD  HYDROmorphone (DILAUDID) 4 MG tablet Take 4 mg by mouth daily.    Historical Provider, MD  nystatin (MYCOSTATIN/NYSTOP) 100000 UNIT/GM POWD Apply 1 g topically every evening. Apply to abdominal folds 06/01/15   Historical Provider, MD  oseltamivir (TAMIFLU) 75 MG capsule Take 1 capsule (75 mg total) by mouth 2 (two) times daily. 06/01/15   Alison MurrayAlma M Devine, MD   BP 164/85 mmHg  Pulse 80  Temp(Src) 97.5 F (36.4 C) (Oral)  Resp 16  SpO2 96% Physical Exam  Constitutional: She  is oriented to person, place, and time. She appears well-developed and well-nourished.  Obese  HENT:  Head: Normocephalic and atraumatic.  Cardiovascular: Normal rate and regular rhythm.   No murmur heard. Pulmonary/Chest: Effort normal. No respiratory distress.  Rhonchi bilaterally  Abdominal: Soft. There is no rebound and no guarding.  Moderate epigastric tenderness  Musculoskeletal:  Bilateral lower extremity edema, right greater than left. Erythematous and macular rash of right flank, bilateral lower extremities, bilateral upper extremities, left greater than right.  Neurological:  She is alert and oriented to person, place, and time.  Mildly confused  Skin: Skin is warm and dry.  Psychiatric: She has a normal mood and affect. Her behavior is normal.  Nursing note and vitals reviewed.   ED Course  Procedures (including critical care time) Labs Review Labs Reviewed  COMPREHENSIVE METABOLIC PANEL - Abnormal; Notable for the following:    Potassium 3.2 (*)    Glucose, Bld 144 (*)    BUN 5 (*)    Albumin 3.4 (*)    ALT 13 (*)    All other components within normal limits  CBC WITH DIFFERENTIAL/PLATELET - Abnormal; Notable for the following:    Platelets 126 (*)    All other components within normal limits  BRAIN NATRIURETIC PEPTIDE - Abnormal; Notable for the following:    B Natriuretic Peptide 234.8 (*)    All other components within normal limits  LIPASE, BLOOD - Abnormal; Notable for the following:    Lipase 59 (*)    All other components within normal limits  BLOOD GAS, VENOUS  D-DIMER, QUANTITATIVE (NOT AT [facility])  Rosezena Sensor, ED    Imaging Review Dg Chest 2 View  06/02/2015  CLINICAL DATA:  Shortness of breath today EXAM: CHEST  2 VIEW COMPARISON:  05/30/2015 FINDINGS: Heart is borderline in size. Mild vascular congestion and bibasilar atelectasis. No overt edema. No effusions or acute bony abnormality. IMPRESSION: Vascular congestion, bibasilar atelectasis.  Electronically Signed   By: Charlett Nose M.D.   On: 06/02/2015 13:23   I have personally reviewed and evaluated these images and lab results as part of my medical decision-making.   EKG Interpretation   Date/Time:  Friday June 02 2015 12:29:13 EDT Ventricular Rate:  68 PR Interval:  144 QRS Duration: 107 QT Interval:  437 QTC Calculation: 465 R Axis:   13 Text Interpretation:  Sinus rhythm Low voltage, precordial leads Abnormal  R-wave progression, early transition Repol abnrm suggests ischemia,  anterior leads Confirmed by Lincoln Brigham [phone]) on 06/02/2015 12:47:20 PM      MDM   Final diagnoses:  Dyspnea  Patient here for increased shortness of breath, recently treated in the hospital for influenza and urinary tract infection. Patient without respiratory distress in the emergency department. BNP is slightly elevated, no overt volume overload on exam.  Patient with asymmetric lower extremity swelling, checking CT to rule out PE.  Patient unable to get a CT PE study she states that something bad happened when she was in dialysis but she cannot blame what this was. Will check d-dimer and if positive obtain VQ scan to rule out PE. Providing dose of Lasix to see if this helps her symptoms. Patient care transferred pending d-dimer, VBG.  Tilden Fossa, MD 06/02/15 [phone]

## 2015-06-02 NOTE — ED Provider Notes (Signed)
70 yo F with sob.  Just discharged from the hospital.  No significant lab findings just far.  Awaiting Ddimer and vbg.  If negative, d/c back to SNF.    d-dimer is age-adjusted negative. VBG without significant hypercapnia. Of note patient was able to yell and scream consistently for about 20-30 minutes. Feel that her shortness of breath is not significant. Discharge home.  Melene Planan Eland Lamantia, DO 06/02/15 1759

## 2015-06-02 NOTE — ED Notes (Addendum)
Pt is refusing to have BP cuff on her arm.  Pt is able to provide her DOB.  Pt is wanting to get her feet on the floor.  Explained to pt that she cannot get OOB d/t risk for fall.  Pt yelling out "please! Help me!"  Pt would not keep her [place], pulse ox, and BP cuff on.  Keeps yelling out "take it out!".  Pt assisted in sitting position in bed.  Then, pt wanted to lay back down.

## 2015-06-02 NOTE — Discharge Instructions (Signed)

## 2015-06-02 NOTE — ED Notes (Signed)
Pt refusing CT angio, [facility] EDP notified, states pt cannot refuse CT angio.  CT made aware.  Went in to check on pt.  Pt continues to yell and states "please!"  But would not specify what she wants.  She states "I don't want to do the blanket. Please!"

## 2015-06-02 NOTE — ED Notes (Signed)
Per [facility], [facility], staff reports SOB, alert and oriented per norm.  sats 94-96% [place] O2 3L.  Pt has confirmed flu and PNA.  Wears O2 at the facility.  Pt c/o back pain as well.

## 2015-06-02 NOTE — ED Notes (Signed)
Pt continues to yell out.  States she is hot , but is asking for a warm blanket.

## 2015-06-03 LAB — CULTURE, BLOOD (ROUTINE X 2)
CULTURE: NO GROWTH
Culture: NO GROWTH

## 2015-07-03 DEATH — deceased
